# Patient Record
Sex: Male | Born: 1935 | Race: White | Hispanic: No | Marital: Married | Smoking: Former smoker
Health system: Southern US, Community
[De-identification: ages and names within clinical notes are randomized; demographics above are authoritative.]

## PROBLEM LIST (undated history)

## (undated) DIAGNOSIS — E785 Hyperlipidemia, unspecified: Secondary | ICD-10-CM

## (undated) DIAGNOSIS — K5730 Diverticulosis of large intestine without perforation or abscess without bleeding: Secondary | ICD-10-CM

## (undated) DIAGNOSIS — M4800 Spinal stenosis, site unspecified: Secondary | ICD-10-CM

## (undated) DIAGNOSIS — IMO0002 Reserved for concepts with insufficient information to code with codable children: Secondary | ICD-10-CM

## (undated) DIAGNOSIS — F32A Depression, unspecified: Secondary | ICD-10-CM

## (undated) DIAGNOSIS — D649 Anemia, unspecified: Secondary | ICD-10-CM

## (undated) DIAGNOSIS — K922 Gastrointestinal hemorrhage, unspecified: Secondary | ICD-10-CM

## (undated) DIAGNOSIS — N400 Enlarged prostate without lower urinary tract symptoms: Secondary | ICD-10-CM

## (undated) DIAGNOSIS — I2510 Atherosclerotic heart disease of native coronary artery without angina pectoris: Secondary | ICD-10-CM

## (undated) DIAGNOSIS — R011 Cardiac murmur, unspecified: Secondary | ICD-10-CM

## (undated) DIAGNOSIS — F329 Major depressive disorder, single episode, unspecified: Secondary | ICD-10-CM

## (undated) DIAGNOSIS — I272 Pulmonary hypertension, unspecified: Secondary | ICD-10-CM

## (undated) DIAGNOSIS — E119 Type 2 diabetes mellitus without complications: Secondary | ICD-10-CM

## (undated) DIAGNOSIS — I10 Essential (primary) hypertension: Secondary | ICD-10-CM

## (undated) DIAGNOSIS — E079 Disorder of thyroid, unspecified: Secondary | ICD-10-CM

## (undated) DIAGNOSIS — I509 Heart failure, unspecified: Secondary | ICD-10-CM

## (undated) DIAGNOSIS — M1990 Unspecified osteoarthritis, unspecified site: Secondary | ICD-10-CM

## (undated) DIAGNOSIS — I350 Nonrheumatic aortic (valve) stenosis: Secondary | ICD-10-CM

## (undated) HISTORY — DX: Pulmonary hypertension, unspecified: I27.20

## (undated) HISTORY — DX: Diverticulosis of large intestine without perforation or abscess without bleeding: K57.30

## (undated) HISTORY — DX: Anemia, unspecified: D64.9

## (undated) HISTORY — DX: Gastrointestinal hemorrhage, unspecified: K92.2

## (undated) HISTORY — DX: Depression, unspecified: F32.A

## (undated) HISTORY — DX: Reserved for concepts with insufficient information to code with codable children: IMO0002

## (undated) HISTORY — DX: Unspecified osteoarthritis, unspecified site: M19.90

## (undated) HISTORY — DX: Essential (primary) hypertension: I10

## (undated) HISTORY — DX: Benign prostatic hyperplasia without lower urinary tract symptoms: N40.0

## (undated) HISTORY — DX: Major depressive disorder, single episode, unspecified: F32.9

## (undated) HISTORY — DX: Atherosclerotic heart disease of native coronary artery without angina pectoris: I25.10

## (undated) HISTORY — DX: Hyperlipidemia, unspecified: E78.5

## (undated) HISTORY — DX: Type 2 diabetes mellitus without complications: E11.9

## (undated) HISTORY — DX: Heart failure, unspecified: I50.9

## (undated) HISTORY — DX: Nonrheumatic aortic (valve) stenosis: I35.0

## (undated) HISTORY — DX: Cardiac murmur, unspecified: R01.1

## (undated) HISTORY — PX: OTHER SURGICAL HISTORY: SHX169

## (undated) HISTORY — DX: Spinal stenosis, site unspecified: M48.00

## (undated) HISTORY — DX: Disorder of thyroid, unspecified: E07.9

---
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
## 1972-07-18 HISTORY — PX: HEMORRHOID SURGERY: SHX153

## 1990-07-18 HISTORY — PX: TOTAL SHOULDER REPLACEMENT: SUR1217

## 1991-07-19 HISTORY — PX: TRANSURETHRAL RESECTION OF PROSTATE: SHX73

## 1993-07-18 HISTORY — PX: CORONARY ARTERY BYPASS GRAFT: SHX141

## 1994-07-18 HISTORY — PX: NASAL STENOSIS REPAIR: SHX2071

## 1997-07-18 HISTORY — PX: CATARACT EXTRACTION: SUR2

## 1997-07-18 HISTORY — PX: CORONARY ARTERY BYPASS GRAFT: SHX141

## 1997-07-18 HISTORY — PX: AORTIC VALVE REPLACEMENT: SHX41

## 2008-03-18 ENCOUNTER — Encounter: Payer: Self-pay | Admitting: Internal Medicine

## 2008-05-16 ENCOUNTER — Ambulatory Visit: Payer: Self-pay | Admitting: Internal Medicine

## 2008-05-16 DIAGNOSIS — I10 Essential (primary) hypertension: Secondary | ICD-10-CM | POA: Insufficient documentation

## 2008-05-16 DIAGNOSIS — D509 Iron deficiency anemia, unspecified: Secondary | ICD-10-CM

## 2008-05-16 DIAGNOSIS — E785 Hyperlipidemia, unspecified: Secondary | ICD-10-CM

## 2008-05-16 DIAGNOSIS — N400 Enlarged prostate without lower urinary tract symptoms: Secondary | ICD-10-CM

## 2008-05-16 DIAGNOSIS — I2510 Atherosclerotic heart disease of native coronary artery without angina pectoris: Secondary | ICD-10-CM | POA: Insufficient documentation

## 2008-05-16 DIAGNOSIS — J309 Allergic rhinitis, unspecified: Secondary | ICD-10-CM | POA: Insufficient documentation

## 2008-05-16 DIAGNOSIS — M19019 Primary osteoarthritis, unspecified shoulder: Secondary | ICD-10-CM

## 2008-05-16 DIAGNOSIS — K5730 Diverticulosis of large intestine without perforation or abscess without bleeding: Secondary | ICD-10-CM | POA: Insufficient documentation

## 2008-05-16 DIAGNOSIS — E1149 Type 2 diabetes mellitus with other diabetic neurological complication: Secondary | ICD-10-CM | POA: Insufficient documentation

## 2008-05-18 DIAGNOSIS — IMO0002 Reserved for concepts with insufficient information to code with codable children: Secondary | ICD-10-CM

## 2008-05-18 HISTORY — DX: Reserved for concepts with insufficient information to code with codable children: IMO0002

## 2008-05-19 LAB — CONVERTED CEMR LAB
ALT: 22 units/L (ref 0–53)
BUN: 21 mg/dL (ref 6–23)
Bilirubin, Direct: 0.1 mg/dL (ref 0.0–0.3)
Chloride: 97 meq/L (ref 96–112)
Cholesterol: 149 mg/dL (ref 0–200)
Eosinophils Absolute: 0.1 10*3/uL (ref 0.0–0.7)
GFR calc Af Amer: 81 mL/min
Glucose, Bld: 113 mg/dL — ABNORMAL HIGH (ref 70–99)
HCT: 36 % — ABNORMAL LOW (ref 39.0–52.0)
HDL: 35.3 mg/dL — ABNORMAL LOW (ref >39.0)
Hemoglobin: 12.6 g/dL — ABNORMAL LOW (ref 13.0–17.0)
Hgb A1c MFr Bld: 6.9 % — ABNORMAL HIGH (ref 4.6–6.0)
MCHC: 34.9 g/dL (ref 30.0–36.0)
MCV: 97.6 fL (ref 78.0–100.0)
Monocytes Absolute: 0.6 10*3/uL (ref 0.1–1.0)
Neutro Abs: 3 10*3/uL (ref 1.4–7.7)
Phosphorus: 3.8 mg/dL (ref 2.3–4.6)
Platelets: 174 10*3/uL (ref 150–400)
Potassium: 4.4 meq/L (ref 3.5–5.1)
RDW: 12.2 % (ref 11.5–14.6)
Sodium: 136 meq/L (ref 135–145)
TSH: 3.95 microintl units/mL (ref 0.35–5.50)
Total Bilirubin: 0.7 mg/dL (ref 0.3–1.2)
Triglycerides: 91 mg/dL (ref 0–149)

## 2008-05-29 ENCOUNTER — Ambulatory Visit: Payer: Self-pay | Admitting: Family Medicine

## 2008-05-29 DIAGNOSIS — I4892 Unspecified atrial flutter: Secondary | ICD-10-CM

## 2008-05-30 ENCOUNTER — Encounter: Payer: Self-pay | Admitting: Internal Medicine

## 2008-05-30 ENCOUNTER — Ambulatory Visit: Payer: Self-pay | Admitting: Family Medicine

## 2008-06-02 ENCOUNTER — Ambulatory Visit: Payer: Self-pay | Admitting: Internal Medicine

## 2008-06-02 LAB — CONVERTED CEMR LAB: Prothrombin Time: 14.3 s

## 2008-06-06 ENCOUNTER — Ambulatory Visit: Payer: Self-pay | Admitting: Internal Medicine

## 2008-06-06 LAB — CONVERTED CEMR LAB: INR: 2.1

## 2008-06-13 ENCOUNTER — Ambulatory Visit: Payer: Self-pay | Admitting: Internal Medicine

## 2008-06-13 LAB — CONVERTED CEMR LAB: Prothrombin Time: 21.6 s

## 2008-06-18 ENCOUNTER — Encounter: Payer: Self-pay | Admitting: Internal Medicine

## 2008-06-18 ENCOUNTER — Telehealth: Payer: Self-pay | Admitting: Internal Medicine

## 2008-06-18 ENCOUNTER — Ambulatory Visit: Payer: Self-pay | Admitting: Internal Medicine

## 2008-06-18 ENCOUNTER — Ambulatory Visit: Payer: Self-pay

## 2008-06-18 DIAGNOSIS — I5041 Acute combined systolic (congestive) and diastolic (congestive) heart failure: Secondary | ICD-10-CM

## 2008-06-20 ENCOUNTER — Telehealth: Payer: Self-pay | Admitting: Internal Medicine

## 2008-06-25 ENCOUNTER — Ambulatory Visit: Payer: Self-pay | Admitting: Internal Medicine

## 2008-06-25 DIAGNOSIS — F329 Major depressive disorder, single episode, unspecified: Secondary | ICD-10-CM

## 2008-07-02 ENCOUNTER — Ambulatory Visit: Payer: Self-pay | Admitting: Internal Medicine

## 2008-07-02 LAB — CONVERTED CEMR LAB: Prothrombin Time: 15.6 s

## 2008-07-03 ENCOUNTER — Telehealth: Payer: Self-pay | Admitting: Internal Medicine

## 2008-07-14 ENCOUNTER — Ambulatory Visit: Payer: Self-pay | Admitting: Internal Medicine

## 2008-07-14 DIAGNOSIS — I4891 Unspecified atrial fibrillation: Secondary | ICD-10-CM

## 2008-07-15 LAB — CONVERTED CEMR LAB
Albumin: 3.3 g/dL — ABNORMAL LOW (ref 3.5–5.2)
BUN: 21 mg/dL (ref 6–23)
Basophils Relative: 0.1 % (ref 0.0–3.0)
CO2: 34 meq/L — ABNORMAL HIGH (ref 19–32)
Chloride: 96 meq/L (ref 96–112)
Creatinine, Ser: 1.2 mg/dL (ref 0.4–1.5)
Eosinophils Relative: 0.5 % (ref 0.0–5.0)
GFR calc Af Amer: 73 mL/min
Glucose, Bld: 261 mg/dL — ABNORMAL HIGH (ref 70–99)
Hemoglobin: 12.2 g/dL — ABNORMAL LOW (ref 13.0–17.0)
Lymphocytes Relative: 26.6 % (ref 12.0–46.0)
Monocytes Relative: 8.4 % (ref 3.0–12.0)
Neutro Abs: 3.5 10*3/uL (ref 1.4–7.7)
Neutrophils Relative %: 64.4 % (ref 43.0–77.0)
RBC: 3.64 M/uL — ABNORMAL LOW (ref 4.22–5.81)
WBC: 5.4 10*3/uL (ref 4.5–10.5)

## 2008-07-22 ENCOUNTER — Ambulatory Visit: Payer: Self-pay | Admitting: Internal Medicine

## 2008-07-22 LAB — CONVERTED CEMR LAB
INR: 1.8
Prothrombin Time: 16.4 s

## 2008-07-24 ENCOUNTER — Telehealth (INDEPENDENT_AMBULATORY_CARE_PROVIDER_SITE_OTHER): Payer: Self-pay | Admitting: *Deleted

## 2008-08-05 ENCOUNTER — Ambulatory Visit: Payer: Self-pay | Admitting: Internal Medicine

## 2008-08-05 LAB — CONVERTED CEMR LAB
INR: 2
Prothrombin Time: 17.5 s

## 2008-08-15 ENCOUNTER — Ambulatory Visit: Payer: Self-pay | Admitting: Internal Medicine

## 2008-08-26 ENCOUNTER — Telehealth: Payer: Self-pay | Admitting: Family Medicine

## 2008-08-28 ENCOUNTER — Ambulatory Visit: Payer: Self-pay | Admitting: Family Medicine

## 2008-09-03 ENCOUNTER — Ambulatory Visit: Payer: Self-pay | Admitting: Internal Medicine

## 2008-09-03 LAB — CONVERTED CEMR LAB: Prothrombin Time: 16.8 s

## 2008-09-11 ENCOUNTER — Ambulatory Visit: Payer: Self-pay | Admitting: Internal Medicine

## 2008-09-12 LAB — CONVERTED CEMR LAB
Albumin: 3.7 g/dL (ref 3.5–5.2)
BUN: 28 mg/dL — ABNORMAL HIGH (ref 6–23)
Chloride: 94 meq/L — ABNORMAL LOW (ref 96–112)
Creatinine, Ser: 1.3 mg/dL (ref 0.4–1.5)
Phosphorus: 3.7 mg/dL (ref 2.3–4.6)

## 2008-10-01 ENCOUNTER — Ambulatory Visit: Payer: Self-pay | Admitting: Internal Medicine

## 2008-10-01 LAB — CONVERTED CEMR LAB: INR: 2

## 2008-10-06 ENCOUNTER — Telehealth: Payer: Self-pay | Admitting: Internal Medicine

## 2008-10-09 ENCOUNTER — Telehealth: Payer: Self-pay | Admitting: Internal Medicine

## 2008-10-28 ENCOUNTER — Ambulatory Visit: Payer: Self-pay | Admitting: Internal Medicine

## 2008-10-28 LAB — CONVERTED CEMR LAB: Prothrombin Time: 17.8 s

## 2008-11-14 ENCOUNTER — Ambulatory Visit: Payer: Self-pay | Admitting: Internal Medicine

## 2008-11-17 LAB — CONVERTED CEMR LAB
Basophils Relative: 0 % (ref 0.0–3.0)
CO2: 32 meq/L (ref 19–32)
Calcium: 9.1 mg/dL (ref 8.4–10.5)
Creatinine, Ser: 1.2 mg/dL (ref 0.4–1.5)
Eosinophils Relative: 2.7 % (ref 0.0–5.0)
HCT: 37 % — ABNORMAL LOW (ref 39.0–52.0)
Hemoglobin: 12.8 g/dL — ABNORMAL LOW (ref 13.0–17.0)
Lymphs Abs: 1.8 10*3/uL (ref 0.7–4.0)
Monocytes Relative: 11.6 % (ref 3.0–12.0)
Platelets: 164 10*3/uL (ref 150.0–400.0)
RBC: 3.8 M/uL — ABNORMAL LOW (ref 4.22–5.81)
Sodium: 133 meq/L — ABNORMAL LOW (ref 135–145)
TSH: 5.8 microintl units/mL — ABNORMAL HIGH (ref 0.35–5.50)
WBC: 6 10*3/uL (ref 4.5–10.5)

## 2008-11-26 ENCOUNTER — Ambulatory Visit: Payer: Self-pay | Admitting: Internal Medicine

## 2008-11-26 LAB — CONVERTED CEMR LAB: INR: 2.9

## 2008-12-02 ENCOUNTER — Encounter: Payer: Self-pay | Admitting: Internal Medicine

## 2008-12-02 ENCOUNTER — Ambulatory Visit: Payer: Self-pay

## 2008-12-16 ENCOUNTER — Telehealth: Payer: Self-pay | Admitting: Internal Medicine

## 2008-12-22 ENCOUNTER — Ambulatory Visit: Payer: Self-pay | Admitting: Cardiovascular Disease

## 2008-12-22 ENCOUNTER — Encounter: Payer: Self-pay | Admitting: Cardiovascular Disease

## 2008-12-22 DIAGNOSIS — R609 Edema, unspecified: Secondary | ICD-10-CM

## 2008-12-24 ENCOUNTER — Ambulatory Visit: Payer: Self-pay | Admitting: Internal Medicine

## 2008-12-24 LAB — CONVERTED CEMR LAB: Prothrombin Time: 17.3 s

## 2009-01-08 ENCOUNTER — Telehealth: Payer: Self-pay | Admitting: Internal Medicine

## 2009-01-13 ENCOUNTER — Telehealth: Payer: Self-pay | Admitting: Family Medicine

## 2009-01-13 DIAGNOSIS — M545 Low back pain: Secondary | ICD-10-CM

## 2009-01-21 ENCOUNTER — Ambulatory Visit: Payer: Self-pay | Admitting: Internal Medicine

## 2009-01-21 LAB — CONVERTED CEMR LAB
INR: 2.5
Prothrombin Time: 19.3 s

## 2009-01-30 ENCOUNTER — Encounter: Payer: Self-pay | Admitting: Family Medicine

## 2009-01-30 ENCOUNTER — Telehealth: Payer: Self-pay | Admitting: Internal Medicine

## 2009-02-03 ENCOUNTER — Telehealth: Payer: Self-pay | Admitting: Internal Medicine

## 2009-02-17 ENCOUNTER — Ambulatory Visit: Payer: Self-pay | Admitting: Internal Medicine

## 2009-02-26 ENCOUNTER — Ambulatory Visit: Payer: Self-pay | Admitting: Cardiovascular Disease

## 2009-03-10 ENCOUNTER — Ambulatory Visit: Payer: Self-pay | Admitting: Internal Medicine

## 2009-04-06 ENCOUNTER — Encounter: Payer: Self-pay | Admitting: Internal Medicine

## 2009-04-07 ENCOUNTER — Ambulatory Visit: Payer: Self-pay | Admitting: Internal Medicine

## 2009-05-05 ENCOUNTER — Ambulatory Visit: Payer: Self-pay | Admitting: Internal Medicine

## 2009-05-05 LAB — CONVERTED CEMR LAB
INR: 3.1
Prothrombin Time: 21.4 s

## 2009-05-18 ENCOUNTER — Ambulatory Visit: Payer: Self-pay | Admitting: Internal Medicine

## 2009-05-21 LAB — CONVERTED CEMR LAB
ALT: 39 units/L (ref 0–53)
Alkaline Phosphatase: 158 units/L — ABNORMAL HIGH (ref 39–117)
Basophils Relative: 0.4 % (ref 0.0–3.0)
Bilirubin, Direct: 0.2 mg/dL (ref 0.0–0.3)
Eosinophils Absolute: 0 10*3/uL (ref 0.0–0.7)
Eosinophils Relative: 0.4 % (ref 0.0–5.0)
Glucose, Bld: 242 mg/dL — ABNORMAL HIGH (ref 70–99)
Lymphocytes Relative: 26.2 % (ref 12.0–46.0)
Neutrophils Relative %: 65.4 % (ref 43.0–77.0)
Phosphorus: 3.9 mg/dL (ref 2.3–4.6)
Potassium: 4.7 meq/L (ref 3.5–5.1)
RBC: 3.64 M/uL — ABNORMAL LOW (ref 4.22–5.81)
Sodium: 135 meq/L (ref 135–145)
Total Protein: 6.8 g/dL (ref 6.0–8.3)
WBC: 7 10*3/uL (ref 4.5–10.5)

## 2009-06-02 ENCOUNTER — Ambulatory Visit: Payer: Self-pay | Admitting: Internal Medicine

## 2009-06-02 LAB — CONVERTED CEMR LAB: INR: 3

## 2009-06-03 ENCOUNTER — Encounter: Payer: Self-pay | Admitting: Family Medicine

## 2009-06-04 ENCOUNTER — Emergency Department: Payer: PRIVATE HEALTH INSURANCE | Admitting: Unknown Physician Specialty

## 2009-06-05 ENCOUNTER — Telehealth: Payer: Self-pay | Admitting: Internal Medicine

## 2009-06-09 ENCOUNTER — Ambulatory Visit: Payer: Self-pay | Admitting: Internal Medicine

## 2009-06-16 ENCOUNTER — Encounter: Payer: Self-pay | Admitting: Family Medicine

## 2009-06-24 ENCOUNTER — Ambulatory Visit: Payer: Self-pay | Admitting: Family Medicine

## 2009-06-30 ENCOUNTER — Ambulatory Visit: Payer: Self-pay | Admitting: Internal Medicine

## 2009-06-30 LAB — CONVERTED CEMR LAB
INR: 2.8
Prothrombin Time: 20.4 s

## 2009-07-27 ENCOUNTER — Ambulatory Visit: Payer: Self-pay | Admitting: Cardiovascular Disease

## 2009-07-30 ENCOUNTER — Ambulatory Visit: Payer: Self-pay | Admitting: Internal Medicine

## 2009-07-30 LAB — CONVERTED CEMR LAB: Prothrombin Time: 22.1 s

## 2009-08-12 ENCOUNTER — Telehealth: Payer: Self-pay | Admitting: Internal Medicine

## 2009-08-12 ENCOUNTER — Ambulatory Visit: Payer: Self-pay | Admitting: Internal Medicine

## 2009-08-18 ENCOUNTER — Ambulatory Visit: Payer: Self-pay

## 2009-08-18 ENCOUNTER — Encounter: Payer: Self-pay | Admitting: Internal Medicine

## 2009-08-27 ENCOUNTER — Ambulatory Visit: Payer: Self-pay | Admitting: Internal Medicine

## 2009-08-27 LAB — CONVERTED CEMR LAB: INR: 2.3

## 2009-08-28 ENCOUNTER — Ambulatory Visit: Payer: Self-pay | Admitting: Internal Medicine

## 2009-09-03 ENCOUNTER — Telehealth: Payer: Self-pay | Admitting: Internal Medicine

## 2009-09-17 ENCOUNTER — Ambulatory Visit: Payer: Self-pay

## 2009-09-17 ENCOUNTER — Encounter: Payer: Self-pay | Admitting: Cardiovascular Disease

## 2009-09-24 ENCOUNTER — Ambulatory Visit: Payer: Self-pay | Admitting: Internal Medicine

## 2009-10-05 ENCOUNTER — Encounter: Payer: Self-pay | Admitting: Internal Medicine

## 2009-10-09 ENCOUNTER — Ambulatory Visit: Payer: Self-pay | Admitting: Internal Medicine

## 2009-10-22 ENCOUNTER — Telehealth: Payer: Self-pay | Admitting: Internal Medicine

## 2009-11-05 ENCOUNTER — Ambulatory Visit: Payer: Self-pay | Admitting: Internal Medicine

## 2009-11-05 LAB — CONVERTED CEMR LAB: Prothrombin Time: 30.1 s

## 2009-11-16 ENCOUNTER — Encounter: Payer: Self-pay | Admitting: Internal Medicine

## 2009-11-24 ENCOUNTER — Ambulatory Visit: Payer: Self-pay | Admitting: Internal Medicine

## 2009-11-25 LAB — CONVERTED CEMR LAB
ALT: 28 units/L (ref 0–53)
Albumin: 3.8 g/dL (ref 3.5–5.2)
Basophils Relative: 0.5 % (ref 0.0–3.0)
GFR calc non Af Amer: 64.17 mL/min (ref >60)
Glucose, Bld: 262 mg/dL — ABNORMAL HIGH (ref 70–99)
HDL: 37 mg/dL — ABNORMAL LOW (ref >39.00)
Hemoglobin: 12.4 g/dL — ABNORMAL LOW (ref 13.0–17.0)
Hgb A1c MFr Bld: 8.4 % — ABNORMAL HIGH (ref 4.6–6.5)
Lymphocytes Relative: 28.3 % (ref 12.0–46.0)
Monocytes Relative: 10.9 % (ref 3.0–12.0)
Neutro Abs: 3.4 10*3/uL (ref 1.4–7.7)
Neutrophils Relative %: 59.5 % (ref 43.0–77.0)
Phosphorus: 3.6 mg/dL (ref 2.3–4.6)
Potassium: 4.5 meq/L (ref 3.5–5.1)
RBC: 3.63 M/uL — ABNORMAL LOW (ref 4.22–5.81)
Sodium: 135 meq/L (ref 135–145)
Total Bilirubin: 0.9 mg/dL (ref 0.3–1.2)
Total Protein: 6.9 g/dL (ref 6.0–8.3)
Triglycerides: 67 mg/dL (ref 0.0–149.0)
VLDL: 13.4 mg/dL (ref 0.0–40.0)
WBC: 5.7 10*3/uL (ref 4.5–10.5)

## 2009-12-09 ENCOUNTER — Ambulatory Visit: Payer: PRIVATE HEALTH INSURANCE

## 2009-12-22 ENCOUNTER — Ambulatory Visit: Payer: Self-pay | Admitting: Internal Medicine

## 2009-12-22 DIAGNOSIS — E039 Hypothyroidism, unspecified: Secondary | ICD-10-CM | POA: Insufficient documentation

## 2009-12-22 LAB — CONVERTED CEMR LAB
INR: 2.1
Prothrombin Time: 24.8 s

## 2009-12-23 LAB — CONVERTED CEMR LAB: Free T4: 0.9 ng/dL (ref 0.6–1.6)

## 2010-01-15 ENCOUNTER — Ambulatory Visit: Payer: Self-pay | Admitting: Internal Medicine

## 2010-01-15 LAB — CONVERTED CEMR LAB: Prothrombin Time: 30.5 s

## 2010-01-26 ENCOUNTER — Ambulatory Visit: Payer: Self-pay | Admitting: Cardiovascular Disease

## 2010-01-26 ENCOUNTER — Telehealth: Payer: Self-pay | Admitting: Cardiovascular Disease

## 2010-02-11 ENCOUNTER — Telehealth: Payer: Self-pay | Admitting: Internal Medicine

## 2010-02-11 ENCOUNTER — Telehealth: Payer: Self-pay | Admitting: Cardiovascular Disease

## 2010-02-12 ENCOUNTER — Ambulatory Visit: Payer: Self-pay | Admitting: Internal Medicine

## 2010-03-12 ENCOUNTER — Ambulatory Visit: Payer: Self-pay | Admitting: Internal Medicine

## 2010-03-12 LAB — CONVERTED CEMR LAB: Prothrombin Time: 17.8 s

## 2010-03-26 ENCOUNTER — Ambulatory Visit: Payer: Self-pay | Admitting: Internal Medicine

## 2010-03-26 LAB — CONVERTED CEMR LAB
INR: 1.7
Prothrombin Time: 19.8 s

## 2010-04-02 ENCOUNTER — Ambulatory Visit: Payer: Self-pay | Admitting: Internal Medicine

## 2010-04-02 LAB — CONVERTED CEMR LAB: INR: 1.8

## 2010-04-12 ENCOUNTER — Telehealth: Payer: Self-pay | Admitting: Internal Medicine

## 2010-04-16 ENCOUNTER — Ambulatory Visit: Payer: Self-pay | Admitting: Internal Medicine

## 2010-04-16 LAB — CONVERTED CEMR LAB
INR: 2
Prothrombin Time: 24.4 s

## 2010-05-14 ENCOUNTER — Ambulatory Visit: Payer: Self-pay | Admitting: Internal Medicine

## 2010-05-17 ENCOUNTER — Ambulatory Visit: Payer: Self-pay | Admitting: Internal Medicine

## 2010-05-17 DIAGNOSIS — L98499 Non-pressure chronic ulcer of skin of other sites with unspecified severity: Secondary | ICD-10-CM | POA: Insufficient documentation

## 2010-05-17 DIAGNOSIS — L570 Actinic keratosis: Secondary | ICD-10-CM | POA: Insufficient documentation

## 2010-05-21 ENCOUNTER — Telehealth: Payer: Self-pay | Admitting: Internal Medicine

## 2010-05-26 ENCOUNTER — Ambulatory Visit: Payer: Self-pay | Admitting: Internal Medicine

## 2010-05-26 LAB — HM DIABETES FOOT EXAM

## 2010-05-28 LAB — CONVERTED CEMR LAB
ALT: 50 units/L (ref 0–53)
Albumin: 3.4 g/dL — ABNORMAL LOW (ref 3.5–5.2)
Basophils Relative: 0.6 % (ref 0.0–3.0)
CO2: 33 meq/L — ABNORMAL HIGH (ref 19–32)
Calcium: 9.2 mg/dL (ref 8.4–10.5)
Eosinophils Absolute: 0.1 10*3/uL (ref 0.0–0.7)
Eosinophils Relative: 2 % (ref 0.0–5.0)
Free T4: 1.01 ng/dL (ref 0.60–1.60)
Glucose, Bld: 234 mg/dL — ABNORMAL HIGH (ref 70–99)
HCT: 37.2 % — ABNORMAL LOW (ref 39.0–52.0)
Hgb A1c MFr Bld: 8.6 % — ABNORMAL HIGH (ref 4.6–6.5)
Lymphs Abs: 1.6 10*3/uL (ref 0.7–4.0)
MCHC: 34.1 g/dL (ref 30.0–36.0)
MCV: 100.3 fL — ABNORMAL HIGH (ref 78.0–100.0)
Monocytes Absolute: 0.6 10*3/uL (ref 0.1–1.0)
Neutro Abs: 2.6 10*3/uL (ref 1.4–7.7)
Neutrophils Relative %: 53.5 % (ref 43.0–77.0)
Potassium: 4.3 meq/L (ref 3.5–5.1)
RBC: 3.71 M/uL — ABNORMAL LOW (ref 4.22–5.81)
Sodium: 133 meq/L — ABNORMAL LOW (ref 135–145)
Total Protein: 6.5 g/dL (ref 6.0–8.3)
WBC: 4.8 10*3/uL (ref 4.5–10.5)

## 2010-06-14 ENCOUNTER — Ambulatory Visit: Payer: Self-pay | Admitting: Internal Medicine

## 2010-06-14 LAB — CONVERTED CEMR LAB
INR: 1.7
Prothrombin Time: 20.5 s

## 2010-06-28 ENCOUNTER — Ambulatory Visit: Payer: Self-pay | Admitting: Internal Medicine

## 2010-06-28 LAB — CONVERTED CEMR LAB: Prothrombin Time: 24.2 s

## 2010-07-06 ENCOUNTER — Encounter: Payer: Self-pay | Admitting: Cardiovascular Disease

## 2010-07-06 ENCOUNTER — Ambulatory Visit: Payer: Self-pay | Admitting: Cardiovascular Disease

## 2010-07-06 DIAGNOSIS — I25810 Atherosclerosis of coronary artery bypass graft(s) without angina pectoris: Secondary | ICD-10-CM | POA: Insufficient documentation

## 2010-07-26 ENCOUNTER — Ambulatory Visit
Admission: RE | Admit: 2010-07-26 | Discharge: 2010-07-26 | Payer: Self-pay | Source: Home / Self Care | Attending: Internal Medicine | Admitting: Internal Medicine

## 2010-07-26 LAB — CONVERTED CEMR LAB: INR: 2

## 2010-08-17 NOTE — Assessment & Plan Note (Signed)
Summary: WOUND ON ARM/ds   Vital Signs:  Patient profile:   77 year old male Weight:      148 pounds Temp:     97.9 degrees F oral BP sitting:   128 / 70  (left arm) Cuff size:   large  Vitals Entered By: Mervin Hack CMA Duncan Dull) (May 17, 2010 4:48 PM) CC: wound on right arm   History of Present Illness: Has had bumps on left arm going on for 3 weeks red and white at times Must have sloughed them off and now some pus and looks inflamed  tried treating it at home then seen by Angelica Chessman RN at [facility] who treated it with antibiotic topically and bandage  still not healing  No fever  Allergies: 1)  Lisinopril (Lisinopril)  Past History:  Past medical, surgical, family and social histories (including risk factors) reviewed for relevance to current acute and chronic problems.  Past Medical History: Reviewed history from 06/09/2009 and no changes required. Allergic rhinitis Anemia-NOS Coronary artery disease s/p CABG 1995 with redo 1999 and AVR 1999 Diabetes mellitus, type II Hyperlipidemia Hypertension Osteoarthritis Benign prostatic hypertrophy Diverticulosis, colon--with repeated bleeds Atrial fibrillation/flutter, 11/09 Depression Congestive heart failure  Past Surgical History: Reviewed history from 12/22/2008 and no changes required. Tonsilectomy/Adenoidectomy---1933 Coronary artery bypass graft--1995, redo 1999 Hemorrhoidectomy--1974 Transurethral resection of prostate--1993 Nasal repair--1996 Aortic valve replacement--pig valve---1999 Right shoulder replacement--1992 Left retinal vitrectomy --2006 Cataracts--1999 Rectal bleeding --numerous times from divertuculosis  Family History: Reviewed history from 05/16/2008 and no changes required. Dad died of sucide @49  Mom died @52  of cancer (male) Only child Not clear about his distant history but there were some long lived grandparents Mat GF died of Parkinson's  Social History: Reviewed  history from 05/16/2008 and no changes required. 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 1 son retarded--lives in group home None in the area Former Smoker--quit 1952 Alcohol use-yes  Has living will. Wife has health care POA and then son, Weston Brass No prior discussions about DNR but he very much wants it. Does not want a feeding tube  Review of Systems       no nausea or dairrhea No trouble with appetite no other skin lesions Losing sight in his right eye---has eval by retina specialist coming up  Physical Exam  Skin:  15mm ulcer with no eschar on left proximal forearm Has adjacent scaly raised area abutting it no surrounding inflammation or redness   Impression & Recommendations:  Problem # 1:  CHRONIC ULCER OF UNSPECIFIED SITE (ICD-707.9) Assessment New has been there for several weeks now Not healing despite proper topical care will treat it as possible early malignancy  will recheck this at his upcoming appt  Problem # 2:  ACTINIC KERATOSIS (ICD-702.0) Assessment: Comment Only  liquid nitrogen to this lesion and the adjacent ulcer for 50 seconds x 2 discussed home care  Orders: Cryotherapy/Destruction benign or premalignant lesion (1st lesion)  (17000)  Complete Medication List: 1)  Metoprolol Tartrate 25 Mg Tabs (Metoprolol tartrate) .... Take 1/2  tablet by mouth twice a day 2)  Glipizide 10 Mg Tabs (Glipizide) .Marland Kitchen.. 1 tablet two times a day by mouth 3)  Aspirin Adult Low Strength 81 Mg Tbec (Aspirin) .... Take 1 tablet by mouth every other day 4)  Multivitamins Tabs (Multiple vitamin) .Marland Kitchen.. 1 past breakfast by mouth 5)  Ranitidine Hcl 75 Mg Tabs (Ranitidine hcl) .... 2 tablets at bedtime 6)  Warfarin Sodium 2.5 Mg Tabs (Warfarin sodium) .Marland Kitchen.. 1 tablet by mouth  daily 7)  Furosemide 40 Mg Tabs (Furosemide) .... Take 1-2 by mouth once daily as needed 8)  Isosorbide Mononitrate Cr 30 Mg Xr24h-tab (Isosorbide mononitrate) .... Take 1 by  mouth once daily 9)  Cozaar 50 Mg Tabs (Losartan potassium) .... Take 1 by mouth once daily 10)  Ipratropium Bromide 0.03 % Soln (Ipratropium bromide) .... As needed 11)  Simethicone 80 Mg Chew (Simethicone) .... As needed 12)  Hydrocodone-acetaminophen 5-325 Mg Tabs (Hydrocodone-acetaminophen) .Marland Kitchen.. 1-2 tabs at bedtime as needed for severe leg pain 13)  Ferrex 150 Forte Plus 50-100 Mg Caps (Fe-succ ac-c-thre ac-b12-fa) .Marland Kitchen.. 1 tablet at bedtime by mouth 14)  Metamucil 30.9 % Powd (Psyllium) .Marland Kitchen.. 1 teaspoon at bedtime 15)  Mylanta 200-200-20 Mg/47ml Susp (Alum & mag hydroxide-simeth) .... As needed as needed 16)  Levothyroxine Sodium 25 Mcg Tabs (Levothyroxine sodium) .... Take one by mouth daily 17)  Bayer Breeze 2 Test Disk (Glucose blood) .... Test up to 4 times per day.  Patient Instructions: 1)  Please keep appt on November 9th   Orders Added: 1)  Est. Patient Level III [99213] 2)  Cryotherapy/Destruction benign or premalignant lesion (1st lesion)  [17000]    Current Allergies (reviewed today): LISINOPRIL (LISINOPRIL)

## 2010-08-17 NOTE — Progress Notes (Signed)
Summary: Geographical information systems officer Call   Call placed by: Benedict Needy, RN,  January 26, 2010 11:41 AM Call placed to: Patient Summary of Call: Attempted to call pt about [employer].  2 classes per week/month $27.48 and 3 classes per week/month $45.00. LMOM TCB Initial call taken by: Benedict Needy, RN,  January 26, 2010 11:42 AM  Follow-up for Phone Call        pt called and gave him the info about [facility] [employer] and the phone number to contact them.  Follow-up by: Benedict Needy, RN,  January 26, 2010 1:49 PM

## 2010-08-17 NOTE — Progress Notes (Signed)
Summary: refill request for metoprolol  Phone Note Refill Request Message from:  Fax from Pharmacy  Refills Requested: Medication #1:  METOPROLOL TARTRATE 25 MG TABS Take 1/2  tablet by mouth once a day   Last Refilled: 03/20/2010 faxed request from [facility] [address] is on your desk. Pt says he takes one half twice a day, chart says one half once a day.  Initial call taken by: Lowella Petties CMA,  April 12, 2010 4:01 PM  Follow-up for Phone Call        chart updated to what he is taking and new Rx sent Follow-up by: Cindee Salt MD,  April 12, 2010 4:59 PM    New/Updated Medications: METOPROLOL TARTRATE 25 MG TABS (METOPROLOL TARTRATE) Take 1/2  tablet by mouth twice a day Prescriptions: METOPROLOL TARTRATE 25 MG TABS (METOPROLOL TARTRATE) Take 1/2  tablet by mouth twice a day  #30 x 11   Entered and Authorized by:   Cindee Salt MD   Signed by:   Cindee Salt MD on 04/12/2010   Method used:   Electronically to        [id number]       [address]       [place], [place]  [address]       Ph: [phone]       Fax: [phone]   RxID:   [id number]

## 2010-08-17 NOTE — Assessment & Plan Note (Signed)
Summary: 2:15 LEGS SWOLLEN/DS   Vital Signs:  Patient profile:   77 year old male Weight:      149 pounds Temp:     97.7 degrees F oral BP sitting:   138 / 70  (left arm) Cuff size:   regular  Vitals Entered By: Mervin Hack CMA Duncan Dull) (August 12, 2009 11:34 AM) CC: pain in ankles   History of Present Illness: Having "terrific" pain in legs Esp in AM started in left leg and now in right as well  Gets "spasms" and it is severe pain Trouble sleeping when this occurs tried 2 extra strength tylenol and quinine Sat with legs up in recliner and it helped  Pain concentrated around medial malleolus Not as noticeable with walking (though he has trouble walking)  Allergies: 1)  Lisinopril (Lisinopril)  Past History:  Past medical, surgical, family and social histories (including risk factors) reviewed for relevance to current acute and chronic problems.  Past Medical History: Reviewed history from 06/09/2009 and no changes required. Allergic rhinitis Anemia-NOS Coronary artery disease s/p CABG 1995 with redo 1999 and AVR 1999 Diabetes mellitus, type II Hyperlipidemia Hypertension Osteoarthritis Benign prostatic hypertrophy Diverticulosis, colon--with repeated bleeds Atrial fibrillation/flutter, 11/09 Depression Congestive heart failure  Past Surgical History: Reviewed history from 12/22/2008 and no changes required. Tonsilectomy/Adenoidectomy---1933 Coronary artery bypass graft--1995, redo 1999 Hemorrhoidectomy--1974 Transurethral resection of prostate--1993 Nasal repair--1996 Aortic valve replacement--pig valve---1999 Right shoulder replacement--1992 Left retinal vitrectomy --2006 Cataracts--1999 Rectal bleeding --numerous times from divertuculosis  Family History: Reviewed history from 05/16/2008 and no changes required. Dad died of sucide @49  Mom died @52  of cancer (male) Only child Not clear about his distant history but there were some long lived  grandparents Mat GF died of Parkinson's  Social History: Reviewed history from 05/16/2008 and no changes required. 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 1 son retarded--lives in group home None in the area Former Smoker--quit 1952 Alcohol use-yes  Has living will. Wife has health care POA and then son, Weston Brass No prior discussions about DNR but he very much wants it. Does not want a feeding tube  Review of Systems       Lots of little things bugging him--shoulder pain stomach trouble--diverticulitis in past--but manages diet carefully (no peanuts, etc) No chest pain  No SOB  Physical Exam  General:  alert.  NAD Neck:  supple and no masses.   Lungs:  normal respiratory effort and normal breath sounds.   Heart:  normal rate, no gallop, and irregular rhythm.   Gr 3/6 sys murmur at base Pulses:  not palpable Extremities:  trace edema without pitting Psych:  normally interactive and good eye contact.     Impression & Recommendations:  Problem # 1:  LEG PAIN, BILATERAL (ICD-729.5) Assessment New  has had various pains but this seems different not classic for ischemic pain but still possible will recheck ABIs norco for pain relief as needed   Orders: LE Arterial Doppler/ABI (Le arterial doppler)  Problem # 2:  ATRIAL FIBRILLATION (ICD-427.31) Assessment: Unchanged rate controlled  no CHF  His updated medication list for this problem includes:    Metoprolol Tartrate 25 Mg Tabs (Metoprolol tartrate) .Marland Kitchen... 1 by mouth once daily    Aspirin Adult Low Strength 81 Mg Tbec (Aspirin) .Marland Kitchen... Take 1 tablet by mouth every other day    Warfarin Sodium 2.5 Mg Tabs (Warfarin sodium) .Marland Kitchen... 1 tablet by mouth daily  Problem # 3:     Complete Medication List: 1)  Metoprolol Tartrate  25 Mg Tabs (Metoprolol tartrate) .Marland Kitchen.. 1 by mouth once daily 2)  Glipizide 10 Mg Tabs (Glipizide) .Marland Kitchen.. 1 tablet two times a day by mouth 3)  Ferrex 150 Forte Plus 50-100  Mg Caps (Fe-succ ac-c-thre ac-b12-fa) .Marland Kitchen.. 1 tablet at bedtime by mouth 4)  Aspirin Adult Low Strength 81 Mg Tbec (Aspirin) .... Take 1 tablet by mouth every other day 5)  Fish Oil Concentrate 1000 Mg Caps (Omega-3 fatty acids) .... By mouth past breakfast 6)  Multivitamins Tabs (Multiple vitamin) .Marland Kitchen.. 1 past breakfast by mouth 7)  Metamucil 30.9 % Powd (Psyllium) .Marland Kitchen.. 1 teaspoon at bedtime 8)  Ranitidine Hcl 75 Mg Tabs (Ranitidine hcl) .... 2 tablets at bedtime 9)  Mylanta 200-200-20 Mg/32ml Susp (Alum & mag hydroxide-simeth) .... As needed as needed 10)  Warfarin Sodium 2.5 Mg Tabs (Warfarin sodium) .Marland Kitchen.. 1 tablet by mouth daily 11)  Furosemide 40 Mg Tabs (Furosemide) .... Take 1-2 by mouth once daily as needed 12)  Isosorbide Mononitrate Cr 30 Mg Xr24h-tab (Isosorbide mononitrate) .... Take 1 by mouth once daily 13)  Ipratropium Bromide 0.03 % Soln (Ipratropium bromide) .... As needed 14)  Simethicone 80 Mg Chew (Simethicone) .... As needed 15)  Cozaar 50 Mg Tabs (Losartan potassium) .... Take 1 by mouth once daily 16)  Hydrocodone-acetaminophen 5-325 Mg Tabs (Hydrocodone-acetaminophen) .Marland Kitchen.. 1-2 tabs at bedtime as needed for severe leg pain  Patient Instructions: 1)  Please keep regular follow up 2)  Please set up leg circulation test Prescriptions: HYDROCODONE-ACETAMINOPHEN 5-325 MG TABS (HYDROCODONE-ACETAMINOPHEN) 1-2 tabs at bedtime as needed for severe leg pain  #60 x 0   Entered and Authorized by:   Cindee Salt MD   Signed by:   Cindee Salt MD on 08/12/2009   Method used:   Print then Give to Patient   RxID:   [id number]   Current Allergies (reviewed today): LISINOPRIL (LISINOPRIL)

## 2010-08-17 NOTE — Progress Notes (Signed)
Summary: Both ankles and calves hurting with redness in calves  Phone Note Call from Patient Call back at [phone]   Caller: Patient Call For: Cindee Salt MD Reason for Call: Lab or Test Results Summary of Call: Pt said having terrific pain in both ankles and goes up calves especially at night for 2 days. Pt said both legs have redness in calf area. No swelling in legs. No known injury. Pt has been taking Tylenol Extra Strength and that does help the pain. Last night had to sit in the chair all night due to ankles hurting. [phone] if needed. Please advise.  Initial call taken by: Lewanda Rife LPN,  August 12, 2009 8:53 AM  Follow-up for Phone Call        recent circulation test was okay but pain is concerning see if he can come in at 2:15PM today Follow-up by: Cindee Salt MD,  August 12, 2009 9:29 AM  Additional Follow-up for Phone Call Additional follow up Details #1::        appt today at 2:15 Additional Follow-up by: Mervin Hack CMA Duncan Dull),  August 12, 2009 9:33 AM

## 2010-08-17 NOTE — Progress Notes (Signed)
Summary: sore on arm   Phone Note Call from Patient Call back at Home Phone [phone]   Caller: Patient Call For: Cindee Salt MD Summary of Call: Patient was in on monday for a sore on his arm. He says that it is stil oozing pinkish pus alot. Patient says that it isn't causing him any pain, but is concerned about the draining. He is asking if he should follow up again or if it is normal to have the oozing.  Initial call taken by: Melody Comas,  May 21, 2010 11:10 AM  Follow-up for Phone Call        this is expected after the treatment I did this may take 2-3 weeks to heal completely If not back close to normal within about 3 weeks, I will need to check it again Follow-up by: Cindee Salt MD,  May 21, 2010 11:59 AM  Additional Follow-up for Phone Call Additional follow up Details #1::        Spoke with patient and advised results.  Additional Follow-up by: Mervin Hack CMA Duncan Dull),  May 21, 2010 12:53 PM

## 2010-08-17 NOTE — Progress Notes (Signed)
Summary: wants to reduce coumadin and diuretic doses  Phone Note Call from Patient Call back at Home Phone [phone]   Caller: Patient Call For: Cindee Salt MD Summary of Call: Pt has been on coumadin for several years and he is concerned about that.  He says every time he bumps his arm he has a lot of bleeding and has slow healing.  He is asking if he can cut that back.  Also, he is up with frequent urination at night so he is asking if his diuretic dose can be reduced as well. Initial call taken by: Lowella Petties CMA,  February 11, 2010 2:39 PM  Follow-up for Phone Call        Please call him back I think he should stay on the coumadin but we can try to reduce the dose a little bit. Have him take 1.25mg  3 days per week and 2.5 mg the other days. We will recheck his protime at the scheduled time  I think he needs to continue the diuretic because he could develop breathing problems without it we can discuss this further at his next visit Follow-up by: Cindee Salt MD,  February 11, 2010 3:20 PM  Additional Follow-up for Phone Call Additional follow up Details #1::        Patient was made aware and will keep his appointments as scheduled. He wrote instructions down to make sure he understood Additional Follow-up by: Janee Morn CMA,  February 11, 2010 4:21 PM

## 2010-08-17 NOTE — Progress Notes (Signed)
Summary: Pt feels better.  Phone Note Call from Patient Call back at Home Phone [phone]   Caller: Patient Call For: Cindee Salt MD Summary of Call: patient calling to advise that he feels much better and that he will continue the meds. Initial call taken by: Mervin Hack CMA Duncan Dull),  September 03, 2009 9:49 AM  Follow-up for Phone Call        good to hear Follow-up by: Cindee Salt MD,  September 03, 2009 10:07 AM

## 2010-08-17 NOTE — Assessment & Plan Note (Signed)
Summary: F8M/AMD   Visit Type:  Follow-up Primary Provider:  Cindee Salt MD  CC:  severe fall in Nov 2010- lac above R eye (fell asleep at dinner table and fell out of chair), unsteady gait, easily fatigued, dozes off easily, feels like HR races when he gets up at night, SOB frequently, and dizzy frequently.  History of Present Illness: 77 yo WM with history of DM, HTN, hyperlipidemia, CAD s/p CABG and redo, s/p porcine AVR and  diagnosis of atrial fibrillation in November 2009 on coumadin therapy who comes in today for cardiac follow up. His lower extremity swelling has been better on increased dose of Lasix.  He denies any chest pain. He has had some palpitations at night when he gets up to go to the bathroom. He has not passed out but he did fall asleep in a chair in November and hit his head sustaining a laceration.  Echo in December of 2009 showed normal LV function with normally functioning AVR and moderate MR. His overall energy level has not been as good since he has been in atrial fibrillation.  He has had some pain in his legs with ambulation but arterial dopplers in may 2010 showed no obstructive lower extremity arterial disease.   He remains in atrial fibrillation today.  His INR has been in a therapeutic range. He is doing well overall. He does report lack of sleep and this seems to be because of right shoulder pain which is chronic following a shoulder replacement surgery. He also has severe chronic low back pain.   Current Medications (verified): 1)  Metoprolol Tartrate 25 Mg Tabs (Metoprolol Tartrate) .Marland Kitchen.. 1 By Mouth Once Daily 2)  Glipizide 10 Mg Tabs (Glipizide) .Marland Kitchen.. 1 Tablet Two Times A Day By Mouth 3)  Ferrex 150 Forte Plus 50-100 Mg Caps (Fe-Succ Ac-C-Thre Ac-B12-Fa) .Marland Kitchen.. 1 Tablet At Bedtime By Mouth 4)  Aspirin Adult Low Strength 81 Mg Tbec (Aspirin) .... Take 1 Tablet By Mouth Every Other Day 5)  Fish Oil Concentrate 1000 Mg Caps (Omega-3 Fatty Acids) .... By Mouth  Past Breakfast 6)  Multivitamins  Tabs (Multiple Vitamin) .Marland Kitchen.. 1 Past Breakfast By Mouth 7)  Metamucil 30.9 % Powd (Psyllium) .Marland Kitchen.. 1 Teaspoon At Bedtime 8)  Ranitidine Hcl 75 Mg Tabs (Ranitidine Hcl) .... 2 Tablets At Bedtime 9)  Mylanta 200-200-20 Mg/28ml Susp (Alum & Mag Hydroxide-Simeth) .... As Needed As Needed 10)  Warfarin Sodium 2.5 Mg  Tabs (Warfarin Sodium) .Marland Kitchen.. 1 Tablet By Mouth Daily 11)  Furosemide 40 Mg Tabs (Furosemide) .... Take 1-2 By Mouth Once Daily As Needed 12)  Isosorbide Mononitrate Cr 30 Mg Xr24h-Tab (Isosorbide Mononitrate) .... Take 1 By Mouth Once Daily 13)  Ipratropium Bromide 0.03 % Soln (Ipratropium Bromide) .... As Needed 14)  Simethicone 80 Mg Chew (Simethicone) .... As Needed 15)  Cozaar 50 Mg Tabs (Losartan Potassium) .... Take 1 By Mouth Once Daily  Allergies (verified): 1)  Lisinopril (Lisinopril)  Past History:  Past Medical History: Reviewed history from 06/09/2009 and no changes required. Allergic rhinitis Anemia-NOS Coronary artery disease s/p CABG 1995 with redo 1999 and AVR 1999 Diabetes mellitus, type II Hyperlipidemia Hypertension Osteoarthritis Benign prostatic hypertrophy Diverticulosis, colon--with repeated bleeds Atrial fibrillation/flutter, 11/09 Depression Congestive heart failure  Social History: Reviewed history from 05/16/2008 and no changes required. 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 1 son retarded--lives in group home None in the area Former Smoker--quit 1952 Alcohol use-yes  Has living will. Wife has health care POA and then son,  Weston Brass No prior discussions about DNR but he very much wants it. Does not want a feeding tube  Review of Systems       The patient complains of fatigue, malaise, palpitations, shortness of breath, and joint pain.  The patient denies fever, weight gain/loss, vision loss, decreased hearing, hoarseness, chest pain, prolonged cough, wheezing, sleep apnea,  coughing up blood, abdominal pain, blood in stool, nausea, vomiting, diarrhea, heartburn, incontinence, blood in urine, muscle weakness, leg swelling, rash, skin lesions, headache, fainting, dizziness, depression, anxiety, enlarged lymph nodes, easy bruising or bleeding, and environmental allergies.    Vital Signs:  Patient profile:   77 year old male Height:      68 inches Weight:      153.75 pounds Pulse rate:   70 / minute Pulse rhythm:   irregularly irregular BP sitting:   110 / 52  (right arm) Cuff size:   regular  Vitals Entered By: Charlena Cross, RN, BSN (July 27, 2009 3:46 PM)  Physical Exam  General:  General: Well developed, well nourished, NAD HEENT: OP clear, mucus membranes moist SKIN: warm, dry Psychiatric: Mood and affect normal Neck: No JVD, no carotid bruits, no thyromegaly, no lymphadenopathy. Lungs:Clear bilaterally, no wheezes, rhonci, crackles [id number], No murmurs, gallops rubs Abdomen: soft, NT, ND, BS present Extremities: Trace bilateral lower ext  edema, pulses 2+.    Impression & Recommendations:  Problem # 1:  ATRIAL FIBRILLATION (ICD-427.31) Remains in persistent atrial fibrillation. Rate controlled on Metoprolol. He is on chronic coumadin and tells me that his INR levels have been therapeutic. No changes at this time.  His updated medication list for this problem includes:    Metoprolol Tartrate 25 Mg Tabs (Metoprolol tartrate) .Marland Kitchen... 1 by mouth once daily    Aspirin Adult Low Strength 81 Mg Tbec (Aspirin) .Marland Kitchen... Take 1 tablet by mouth every other day    Warfarin Sodium 2.5 Mg Tabs (Warfarin sodium) .Marland Kitchen... 1 tablet by mouth daily  Problem # 2:  CORONARY ARTERY DISEASE (ICD-414.00) Stable. No chest pain. Continue current medications. Repeat echo in 6 months.  His updated medication list for this problem includes:    Metoprolol Tartrate 25 Mg Tabs (Metoprolol tartrate) .Marland Kitchen... 1 by mouth once daily    Aspirin Adult Low Strength 81 Mg Tbec  (Aspirin) .Marland Kitchen... Take 1 tablet by mouth every other day    Warfarin Sodium 2.5 Mg Tabs (Warfarin sodium) .Marland Kitchen... 1 tablet by mouth daily    Isosorbide Mononitrate Cr 30 Mg Xr24h-tab (Isosorbide mononitrate) .Marland Kitchen... Take 1 by mouth once daily  Orders: Echocardiogram (Echo)  Problem # 3:  HYPERTENSION (ICD-401.9) Well controlled on current therapy.   His updated medication list for this problem includes:    Metoprolol Tartrate 25 Mg Tabs (Metoprolol tartrate) .Marland Kitchen... 1 by mouth once daily    Aspirin Adult Low Strength 81 Mg Tbec (Aspirin) .Marland Kitchen... Take 1 tablet by mouth every other day    Furosemide 40 Mg Tabs (Furosemide) .Marland Kitchen... Take 1-2 by mouth once daily as needed    Cozaar 50 Mg Tabs (Losartan potassium) .Marland Kitchen... Take 1 by mouth once daily  Problem # 4:  BACK PAIN (ICD-724.5) It seems that his back pain and shoulder pain are his biggest complaints at this time. He has not seen an orthopedic surgeon recently. I have asked him to speak to Dr. Alphonsus Sias about this and possibly get a referral over for a second opinion in regards to his back and shoulder.   Patient Instructions: 1)  Your physician  recommends that you schedule a follow-up appointment in: 6 months 2)  Your physician has requested that you have an echocardiogram.  Echocardiography is a painless test that uses sound waves to create images of your heart. It provides your doctor with information about the size and shape of your heart and how well your heart's chambers and valves are working.  This procedure takes approximately one hour. There are no restrictions for this procedure.

## 2010-08-17 NOTE — Consult Note (Signed)
Summary: [facility]   Imported By: Lanelle Bal 10/17/2009 08:37:08  _____________________________________________________________________  External Attachment:    Type:   Image     Comment:   External Document  Appended Document: [facility]    Clinical Lists Changes  Observations: Added new observation of DIAB EYE EX: Mild non-proliferative diabetic retinopathy.    (10/05/2009 9:43)       Diabetic Eye Exam  Procedure date:  10/05/2009  Findings:      Mild non-proliferative diabetic retinopathy.

## 2010-08-17 NOTE — Assessment & Plan Note (Signed)
Summary: URI   Vital Signs:  Patient profile:   77 year old male Weight:      155 pounds Temp:     99.0 degrees F oral Resp:     14 per minute BP sitting:   120 / 60  (left arm) Cuff size:   large  Vitals Entered By: Mervin Hack CMA Duncan Dull) (August 28, 2009 4:01 PM) CC: cold x1week   History of Present Illness: Wife got cold a week ago Now he got it  started for him 5-6 days ago raspy voice congestion and runny nose bad cough with slight mucus Lots of sneezing  Low grade fever last 2 nights Gets cold before dinner by no sweats Breathing is "rough"---some worse than usual but may be largely due to nasal congestion  Allergies: 1)  Lisinopril (Lisinopril)  Past History:  Past medical, surgical, family and social histories (including risk factors) reviewed for relevance to current acute and chronic problems.  Past Medical History: Reviewed history from 06/09/2009 and no changes required. Allergic rhinitis Anemia-NOS Coronary artery disease s/p CABG 1995 with redo 1999 and AVR 1999 Diabetes mellitus, type II Hyperlipidemia Hypertension Osteoarthritis Benign prostatic hypertrophy Diverticulosis, colon--with repeated bleeds Atrial fibrillation/flutter, 11/09 Depression Congestive heart failure  Past Surgical History: Reviewed history from 12/22/2008 and no changes required. Tonsilectomy/Adenoidectomy---1933 Coronary artery bypass graft--1995, redo 1999 Hemorrhoidectomy--1974 Transurethral resection of prostate--1993 Nasal repair--1996 Aortic valve replacement--pig valve---1999 Right shoulder replacement--1992 Left retinal vitrectomy --2006 Cataracts--1999 Rectal bleeding --numerous times from divertuculosis  Family History: Reviewed history from 05/16/2008 and no changes required. Dad died of sucide @49  Mom died @52  of cancer (male) Only child Not clear about his distant history but there were some long lived grandparents Mat GF died of  Parkinson's  Social History: Reviewed history from 05/16/2008 and no changes required. 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 1 son retarded--lives in group home None in the area Former Smoker--quit 1952 Alcohol use-yes  Has living will. Wife has health care POA and then son, Weston Brass No prior discussions about DNR but he very much wants it. Does not want a feeding tube  Review of Systems       No nausea or vomiting appetite is fine  Physical Exam  General:  alert.  NAD Head:  mild maxillary and frontal tenderness Nose:  moderate inflammation with some thick green mucus bilat Mouth:  no erythema and no exudates.   Neck:  supple, no masses, and no cervical lymphadenopathy.   Lungs:  normal respiratory effort, no intercostal retractions, no accessory muscle use, and normal breath sounds.     Impression & Recommendations:  Problem # 1:  SINUSITIS - ACUTE-NOS (ICD-461.9) Assessment New likely bacterial infection with tenderness and purulent mucus seen from the turbinates will try amoxil analgesics  Complete Medication List: 1)  Metoprolol Tartrate 25 Mg Tabs (Metoprolol tartrate) .Marland Kitchen.. 1 by mouth once daily 2)  Glipizide 10 Mg Tabs (Glipizide) .Marland Kitchen.. 1 tablet two times a day by mouth 3)  Ferrex 150 Forte Plus 50-100 Mg Caps (Fe-succ ac-c-thre ac-b12-fa) .Marland Kitchen.. 1 tablet at bedtime by mouth 4)  Aspirin Adult Low Strength 81 Mg Tbec (Aspirin) .... Take 1 tablet by mouth every other day 5)  Fish Oil Concentrate 1000 Mg Caps (Omega-3 fatty acids) .... By mouth past breakfast 6)  Multivitamins Tabs (Multiple vitamin) .Marland Kitchen.. 1 past breakfast by mouth 7)  Metamucil 30.9 % Powd (Psyllium) .Marland Kitchen.. 1 teaspoon at bedtime 8)  Ranitidine Hcl 75 Mg Tabs (Ranitidine hcl) .... 2 tablets at  bedtime 9)  Mylanta 200-200-20 Mg/76ml Susp (Alum & mag hydroxide-simeth) .... As needed as needed 10)  Warfarin Sodium 2.5 Mg Tabs (Warfarin sodium) .Marland Kitchen.. 1 tablet by mouth daily 11)   Furosemide 40 Mg Tabs (Furosemide) .... Take 1-2 by mouth once daily as needed 12)  Isosorbide Mononitrate Cr 30 Mg Xr24h-tab (Isosorbide mononitrate) .... Take 1 by mouth once daily 13)  Ipratropium Bromide 0.03 % Soln (Ipratropium bromide) .... As needed 14)  Simethicone 80 Mg Chew (Simethicone) .... As needed 15)  Cozaar 50 Mg Tabs (Losartan potassium) .... Take 1 by mouth once daily 16)  Hydrocodone-acetaminophen 5-325 Mg Tabs (Hydrocodone-acetaminophen) .Marland Kitchen.. 1-2 tabs at bedtime as needed for severe leg pain 17)  Amoxicillin 500 Mg Tabs (Amoxicillin) .... 2 tabs by mouth two times a day for sinus infection  Patient Instructions: 1)  Please keep regular follow up 2)  Call next week if not better on the antibiotic Prescriptions: AMOXICILLIN 500 MG TABS (AMOXICILLIN) 2 tabs by mouth two times a day for sinus infection  #40 x 0   Entered and Authorized by:   Cindee Salt MD   Signed by:   Cindee Salt MD on 08/28/2009   Method used:   Electronically to        [id number] (ret[facility])       [address]       [place], [place]  [address]       Ph: [phone]       Fax: [phone]   RxID:   [id number]   Current Allergies (reviewed today): LISINOPRIL (LISINOPRIL)

## 2010-08-17 NOTE — Assessment & Plan Note (Signed)
Summary: ROA 6 MTHS CYD   Vital Signs:  Patient profile:   77 year old male Weight:      149 pounds BMI:     22.74 Temp:     97.7 degrees F oral Pulse rate:   60 / minute Pulse rhythm:   irregular BP sitting:   128 / 60  (left arm) Cuff size:   large  Vitals Entered By: Mervin Hack CMA Duncan Dull) (Nov 24, 2009 10:44 AM) CC: 6 month follow-up   History of Present Illness: Feels fairly well Much better than 6 months ago Has kept up with cardiology recent echo okay  Breathing  okay Stable DOE--needs to rest after 1/4 mile flat. Can do a flight of stairs without stopping Sleeps in recliner at times--mostly for right shoulder No PND Occ palpitations if overdoes activity Occ spasm in left ankle but no sig swelling Does take double dose of lasix if legs swell some  Takes sugar daily 79- 167 Did have hard time "getting going" with the 79 but no set hypoglycemic reactions No foot pain or sig numbness  saw orthopedist--told to take celebrex. wife didn't want him to take it I recommended tylenol--celebrex not a good idea  Allergies: 1)  Lisinopril (Lisinopril)  Past History:  Past medical, surgical, family and social histories (including risk factors) reviewed for relevance to current acute and chronic problems.  Past Medical History: Reviewed history from 06/09/2009 and no changes required. Allergic rhinitis Anemia-NOS Coronary artery disease s/p CABG 1995 with redo 1999 and AVR 1999 Diabetes mellitus, type II Hyperlipidemia Hypertension Osteoarthritis Benign prostatic hypertrophy Diverticulosis, colon--with repeated bleeds Atrial fibrillation/flutter, 11/09 Depression Congestive heart failure  Past Surgical History: Reviewed history from 12/22/2008 and no changes required. Tonsilectomy/Adenoidectomy---1933 Coronary artery bypass graft--1995, redo 1999 Hemorrhoidectomy--1974 Transurethral resection of prostate--1993 Nasal repair--1996 Aortic valve  replacement--pig valve---1999 Right shoulder replacement--1992 Left retinal vitrectomy --2006 Cataracts--1999 Rectal bleeding --numerous times from divertuculosis  Family History: Reviewed history from 05/16/2008 and no changes required. Dad died of sucide @49  Mom died @52  of cancer (male) Only child Not clear about his distant history but there were some long lived grandparents Mat GF died of Parkinson's  Social History: Reviewed history from 05/16/2008 and no changes required. 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 1 son retarded--lives in group home None in the area Former Smoker--quit 1952 Alcohol use-yes  Has living will. Wife has health care POA and then son, Weston Brass No prior discussions about DNR but he very much wants it. Does not want a feeding tube  Review of Systems       appetite is okay weight is down 6# from last time sleep is variable--winds up in recliner at times for shoulder comfort Mild tremor with eating--very slow progression only   Physical Exam  General:  alert and normal appearance.   Neck:  supple, no masses, no thyromegaly, and no cervical lymphadenopathy.   Lungs:  normal respiratory effort and normal breath sounds.   Heart:  normal rate, no gallop, and irregular rhythm.   Gr 2/6 aortic systolic murmur Pulses:  not palpable Extremities:  trace ankle edema Skin:  no suspicious lesions and no ulcerations.   Psych:  normally interactive, good eye contact, not anxious appearing, and not depressed appearing.    Diabetes Management Exam:    Foot Exam (with socks and/or shoes not present):       Sensory-Pinprick/Light touch:          Left medial foot (L-4): diminished  Left dorsal foot (L-5): diminished          Left lateral foot (S-1): diminished          Right medial foot (L-4): diminished          Right dorsal foot (L-5): diminished          Right lateral foot (S-1): diminished       Inspection:           Left foot: normal          Right foot: normal       Nails:          Left foot: fungal infection          Right foot: fungal infection   Impression & Recommendations:  Problem # 1:  DIABETES MELLITUS, TYPE II (ICD-250.00) Assessment Unchanged  doing well may want to decrease glipizide  His updated medication list for this problem includes:    Glipizide 10 Mg Tabs (Glipizide) .Marland Kitchen... 1 tablet two times a day by mouth    Aspirin Adult Low Strength 81 Mg Tbec (Aspirin) .Marland Kitchen... Take 1 tablet by mouth every other day    Cozaar 50 Mg Tabs (Losartan potassium) .Marland Kitchen... Take 1 by mouth once daily  Labs Reviewed: Creat: 1.3 (05/18/2009)     Last Eye Exam: Mild non-proliferative diabetic retinopathy.    (10/05/2009) Reviewed HgBA1c results: 8.4 (05/18/2009)  8.8 (11/14/2008)  Orders: TLB-A1C / Hgb A1C (Glycohemoglobin) (83036-A1C)  Problem # 2:  ATRIAL FIBRILLATION (ICD-427.31) Assessment: Unchanged good rate control  on coumadin  His updated medication list for this problem includes:    Metoprolol Tartrate 25 Mg Tabs (Metoprolol tartrate) .Marland Kitchen... 1 by mouth once daily    Aspirin Adult Low Strength 81 Mg Tbec (Aspirin) .Marland Kitchen... Take 1 tablet by mouth every other day    Warfarin Sodium 2.5 Mg Tabs (Warfarin sodium) .Marland Kitchen... 1 tablet by mouth daily  Problem # 3:  ACUTE COMBINED SYSTOLIC&DIASTOLIC HEART FAILURE (ICD-428.41) Assessment: Unchanged stable NYHA class 2  neutral fluid status  His updated medication list for this problem includes:    Metoprolol Tartrate 25 Mg Tabs (Metoprolol tartrate) .Marland Kitchen... 1 by mouth once daily    Aspirin Adult Low Strength 81 Mg Tbec (Aspirin) .Marland Kitchen... Take 1 tablet by mouth every other day    Warfarin Sodium 2.5 Mg Tabs (Warfarin sodium) .Marland Kitchen... 1 tablet by mouth daily    Furosemide 40 Mg Tabs (Furosemide) .Marland Kitchen... Take 1-2 by mouth once daily as needed    Cozaar 50 Mg Tabs (Losartan potassium) .Marland Kitchen... Take 1 by mouth once daily  Problem # 4:  HYPERLIPIDEMIA  (ICD-272.4) Assessment: Unchanged  on fish oil only may not need if trig very low  Labs Reviewed: SGOT: 39 (05/18/2009)   SGPT: 39 (05/18/2009)   HDL:35.3 (05/16/2008)  LDL:96 (05/16/2008)  Chol:149 (05/16/2008)  Trig:91 (05/16/2008)  Orders: TLB-Lipid Panel (80061-LIPID) TLB-Hepatic/Liver Function Pnl (80076-HEPATIC) Venipuncture (04540)  Complete Medication List: 1)  Metoprolol Tartrate 25 Mg Tabs (Metoprolol tartrate) .Marland Kitchen.. 1 by mouth once daily 2)  Glipizide 10 Mg Tabs (Glipizide) .Marland Kitchen.. 1 tablet two times a day by mouth 3)  Aspirin Adult Low Strength 81 Mg Tbec (Aspirin) .... Take 1 tablet by mouth every other day 4)  Multivitamins Tabs (Multiple vitamin) .Marland Kitchen.. 1 past breakfast by mouth 5)  Ranitidine Hcl 75 Mg Tabs (Ranitidine hcl) .... 2 tablets at bedtime 6)  Warfarin Sodium 2.5 Mg Tabs (Warfarin sodium) .Marland Kitchen.. 1 tablet by mouth daily 7)  Furosemide 40 Mg Tabs (  Furosemide) .... Take 1-2 by mouth once daily as needed 8)  Isosorbide Mononitrate Cr 30 Mg Xr24h-tab (Isosorbide mononitrate) .... Take 1 by mouth once daily 9)  Cozaar 50 Mg Tabs (Losartan potassium) .... Take 1 by mouth once daily 10)  Ipratropium Bromide 0.03 % Soln (Ipratropium bromide) .... As needed 11)  Simethicone 80 Mg Chew (Simethicone) .... As needed 12)  Hydrocodone-acetaminophen 5-325 Mg Tabs (Hydrocodone-acetaminophen) .Marland Kitchen.. 1-2 tabs at bedtime as needed for severe leg pain 13)  Ferrex 150 Forte Plus 50-100 Mg Caps (Fe-succ ac-c-thre ac-b12-fa) .Marland Kitchen.. 1 tablet at bedtime by mouth 14)  Fish Oil Concentrate 1000 Mg Caps (Omega-3 fatty acids) .... By mouth past breakfast 15)  Metamucil 30.9 % Powd (Psyllium) .Marland Kitchen.. 1 teaspoon at bedtime 16)  Mylanta 200-200-20 Mg/37ml Susp (Alum & mag hydroxide-simeth) .... As needed as needed  Other Orders: TLB-Renal Function Panel (80069-RENAL) TLB-CBC Platelet - w/Differential (85025-CBCD) TLB-TSH (Thyroid Stimulating Hormone) (16109-UEA)  Patient Instructions: 1)  Please  schedule a follow-up appointment in 6 months .   Current Allergies (reviewed today): LISINOPRIL (LISINOPRIL)  Appended Document: ROA 6 MTHS CYD  Laboratory Results   Blood Tests   Date/Time Recieved: Nov 24, 2009 11:42 AM  Date/Time Reported: Nov 24, 2009 11:42 AM   PT: 26.7 s   (Normal Range: 10.6-13.4)  INR: 2.2   (Normal Range: 0.88-1.12   Therap INR: 2.0-3.5)      ANTICOAGULATION RECORD PREVIOUS REGIMEN & LAB RESULTS Anticoagulation Diagnosis:  Atrial fibrillation on  06/09/2009 Previous INR Goal Range:  2.0-3.5 on  06/13/2008 Previous INR:  2.5 on  11/05/2009 Previous Coumadin Dose(mg):  2.5mg  every day on  06/09/2009 Previous Regimen:  2.5mg  qd, 1.25thur on  09/24/2009 Previous Coagulation Comments:   . on  06/30/2009  NEW REGIMEN & LAB RESULTS Current INR: 2.2 Regimen: 2.5mg  qd, 1.25thur  (no change)  Provider: Hiedi Touchton      Repeat testing in: 4 weeks MEDICATIONS METOPROLOL TARTRATE 25 MG TABS (METOPROLOL TARTRATE) 1 by mouth once daily GLIPIZIDE 10 MG TABS (GLIPIZIDE) 1 tablet two times a day by mouth ASPIRIN ADULT LOW STRENGTH 81 MG TBEC (ASPIRIN) take 1 tablet by mouth every other day MULTIVITAMINS  TABS (MULTIPLE VITAMIN) 1 past breakfast by mouth RANITIDINE HCL 75 MG TABS (RANITIDINE HCL) 2 tablets at bedtime WARFARIN SODIUM 2.5 MG  TABS (WARFARIN SODIUM) 1 tablet by mouth daily FUROSEMIDE 40 MG TABS (FUROSEMIDE) take 1-2 by mouth once daily as needed ISOSORBIDE MONONITRATE CR 30 MG XR24H-TAB (ISOSORBIDE MONONITRATE) take 1 by mouth once daily COZAAR 50 MG TABS (LOSARTAN POTASSIUM) take 1 by mouth once daily IPRATROPIUM BROMIDE 0.03 % SOLN (IPRATROPIUM BROMIDE) as needed SIMETHICONE 80 MG CHEW (SIMETHICONE) as needed HYDROCODONE-ACETAMINOPHEN 5-325 MG TABS (HYDROCODONE-ACETAMINOPHEN) 1-2 tabs at bedtime as needed for severe leg pain FERREX 150 FORTE PLUS 50-100 MG CAPS (FE-SUCC AC-C-THRE AC-B12-FA) 1 tablet at bedtime by mouth FISH OIL CONCENTRATE 1000 MG  CAPS (OMEGA-3 FATTY ACIDS) by mouth past breakfast METAMUCIL 30.9 % POWD (PSYLLIUM) 1 teaspoon at bedtime MYLANTA 200-200-20 MG/5ML SUSP (ALUM & MAG HYDROXIDE-SIMETH) as needed as needed   Anticoagulation Visit Questionnaire      Coumadin dose missed/changed:  No      Abnormal Bleeding Symptoms:  No Any diet changes including alcohol intake, vegetables or greens since the last visit:  No Any illnesses or hospitalizations since the last visit:  No Any signs of clotting since the last visit (including chest discomfort, dizziness, shortness of breath, arm tingling, slurred speech, swelling or redness  in leg):  No

## 2010-08-17 NOTE — Assessment & Plan Note (Signed)
Summary: 6 month follow up/rbh   Vital Signs:  Patient profile:   77 year old [place] Weight:      146 pounds Temp:     97.6 degrees F oral Pulse rate:   60 / minute Pulse rhythm:   regular BP sitting:   140 / 68  (left arm) Cuff size:   regular  Vitals Entered By: Mervin Hack CMA Duncan Dull) (May 26, 2010 9:10 AM) CC: 6 month follow-up   History of Present Illness: Place on left arm is healing somewhat now No sig pain  Still having trouble with right shoulder Uses oxycodone for this Uses stool softener and this helps his bowels while on this considering injection there--but has messed sugars up  some back pain as well--- had MRI done and was referred to pain clinic getting PT at [facility]  checks sugars daily Most under 140 and some under 100 No sig hypogylcemic reactions Mild twinges in ankles but no sig foot pain  Heart okay No palpitations but does feel it irregular No chest pain Gets stuffed up in head--some SOB with this  No change in limited exercise tolerance  Allergies: 1)  Lisinopril (Lisinopril)  Past History:  Past medical, surgical, family and social histories (including risk factors) reviewed for relevance to current acute and chronic problems.  Past Medical History: Reviewed history from 06/09/2009 and no changes required. Allergic rhinitis Anemia-NOS Coronary artery disease s/p CABG 1995 with redo 1999 and AVR 1999 Diabetes mellitus, type II Hyperlipidemia Hypertension Osteoarthritis Benign prostatic hypertrophy Diverticulosis, colon--with repeated bleeds Atrial fibrillation/flutter, 11/09 Depression Congestive heart failure  Past Surgical History: Reviewed history from 12/22/2008 and no changes required. Tonsilectomy/Adenoidectomy---1933 Coronary artery bypass graft--1995, redo 1999 Hemorrhoidectomy--1974 Transurethral resection of prostate--1993 Nasal repair--1996 Aortic valve replacement--pig valve---1999 Right shoulder  replacement--1992 Left retinal vitrectomy --2006 Cataracts--1999 Rectal bleeding --numerous times from divertuculosis  Family History: Reviewed history from 05/16/2008 and no changes required. Dad died of sucide @[place]  Mom died @52  of cancer ([place]) Only child Not clear about his distant history but there were some long lived grandparents Mat GF died of Parkinson's  Social History: Reviewed history from 05/16/2008 and no changes required. 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 1 son retarded--lives in group home None in the area Former Smoker--quit 1952 Alcohol use-yes  Has living will. Wife has health care POA and then son, Weston Brass No prior discussions about DNR but he very much wants it. Does not want a feeding tube  Review of Systems       appetite is fine weighs himself daily---fairly stable sleep is not that good--no sig change  Physical Exam  General:  alert.  NAD Neck:  supple, no masses, no thyromegaly, no carotid bruits, and no cervical lymphadenopathy.   Lungs:  normal respiratory effort, no intercostal retractions, no accessory muscle use, and normal breath sounds.   Heart:  normal rate, no gallop, and irregular rhythm.   Gr 3/6 systolic murmur loudest at base but also in apex Pulses:  faint in left foot, absent in right Extremities:  trace edema Skin:  no ulcerations.  Left arm lesion is just starting to partially granulate Psych:  normally interactive, good eye contact, not anxious appearing, and not depressed appearing.    Diabetes Management Exam:    Foot Exam (with socks and/or shoes not present):       Sensory-Pinprick/Light touch:          Left medial foot (L-4): diminished  Left dorsal foot (L-5): diminished          Left lateral foot (S-1): diminished          Right medial foot (L-4): diminished          Right dorsal foot (L-5): diminished          Right lateral foot (S-1): diminished       Inspection:           Left foot: normal          Right foot: normal       Nails:          Left foot: fungal infection          Right foot: fungal infection   Impression & Recommendations:  Problem # 1:  OSTEOARTHRITIS (ICD-715.90) Assessment Comment Only  back and shoulder pain Rx from pain clinic will have PT address right shoulder--injection not a good idea with partial replacement there  The following medications were removed from the medication list:    Hydrocodone-acetaminophen 5-325 Mg Tabs (Hydrocodone-acetaminophen) .Marland Kitchen... 1-2 tabs at bedtime as needed for severe leg pain His updated medication list for this problem includes:    Aspirin Adult Low Strength 81 Mg Tbec (Aspirin) .Marland Kitchen... Take 1 tablet by mouth every other day    Oxycodone Hcl 5 Mg Tabs (Oxycodone hcl) .Marland Kitchen... 1/2- 1 tab by mouth three times a day as needed for pain  Orders: Physical Therapy Referral (PT)  Problem # 2:  ACUTE COMBINED SYSTOLIC&DIASTOLIC HEART FAILURE (ICD-428.41) Assessment: Comment Only stable status or perhaps slight decline compensated and neutral fluid status  His updated medication list for this problem includes:    Metoprolol Tartrate 25 Mg Tabs (Metoprolol tartrate) .Marland Kitchen... Take 1/2  tablet by mouth twice a day    Warfarin Sodium 2.5 Mg Tabs (Warfarin sodium) .Marland Kitchen... 1 tablet by mouth daily    Furosemide 40 Mg Tabs (Furosemide) .Marland Kitchen... Take 1-2 by mouth once daily as needed    Cozaar 50 Mg Tabs (Losartan potassium) .Marland Kitchen... Take 1 by mouth once daily    Aspirin Adult Low Strength 81 Mg Tbec (Aspirin) .Marland Kitchen... Take 1 tablet by mouth every other day  Problem # 3:  DIABETES MELLITUS, TYPE II (ICD-250.00) Assessment: Unchanged  still seems to have reasonable control will check A1c  His updated medication list for this problem includes:    Glipizide 10 Mg Tabs (Glipizide) .Marland Kitchen... 1 tablet two times a day by mouth    Cozaar 50 Mg Tabs (Losartan potassium) .Marland Kitchen... Take 1 by mouth once daily    Aspirin Adult Low Strength 81  Mg Tbec (Aspirin) .Marland Kitchen... Take 1 tablet by mouth every other day  Orders: TLB-A1C / Hgb A1C (Glycohemoglobin) (83036-A1C)  Problem # 4:  ATRIAL FIBRILLATION (ICD-427.31) Assessment: Unchanged good rate control on coumadin  His updated medication list for this problem includes:    Metoprolol Tartrate 25 Mg Tabs (Metoprolol tartrate) .Marland Kitchen... Take 1/2  tablet by mouth twice a day    Warfarin Sodium 2.5 Mg Tabs (Warfarin sodium) .Marland Kitchen... 1 tablet by mouth daily    Aspirin Adult Low Strength 81 Mg Tbec (Aspirin) .Marland Kitchen... Take 1 tablet by mouth every other day  Problem # 5:  ANEMIA-NOS (ICD-285.9) Assessment: Unchanged will stop iron if the Hgb still okay  His updated medication list for this problem includes:    Ferrex 150 Forte Plus 50-100 Mg Caps (Fe-succ ac-c-thre ac-b12-fa) .Marland Kitchen... 1 tablet at bedtime by mouth  Hgb: 12.4 (11/24/2009)   Hct:  36.4 (11/24/2009)   Platelets: 134.0 (11/24/2009) RBC: 3.63 (11/24/2009)   RDW: 15.0 (11/24/2009)   WBC: 5.7 (11/24/2009) MCV: 100.2 (11/24/2009)   MCHC: 34.0 (11/24/2009) TSH: 6.53 (12/22/2009)  Problem # 6:  HYPERTENSION (ICD-401.9) Assessment: Unchanged  reasonable control on meds  His updated medication list for this problem includes:    Metoprolol Tartrate 25 Mg Tabs (Metoprolol tartrate) .Marland Kitchen... Take 1/2  tablet by mouth twice a day    Furosemide 40 Mg Tabs (Furosemide) .Marland Kitchen... Take 1-2 by mouth once daily as needed    Cozaar 50 Mg Tabs (Losartan potassium) .Marland Kitchen... Take 1 by mouth once daily  BP today: 140/68 Prior BP: 128/70 (05/17/2010)  Labs Reviewed: K+: 4.5 (11/24/2009) Creat: : 1.1 (11/24/2009)   Chol: 116 (11/24/2009)   HDL: 37.00 (11/24/2009)   LDL: 66 (11/24/2009)   TG: 67.0 (11/24/2009)  Orders: TLB-Renal Function Panel (80069-RENAL) TLB-CBC Platelet - w/Differential (85025-CBCD) TLB-Hepatic/Liver Function Pnl (80076-HEPATIC) Venipuncture (16109)  Complete Medication List: 1)  Levothyroxine Sodium 25 Mcg Tabs (Levothyroxine sodium)  .... Take one by mouth daily 2)  Metoprolol Tartrate 25 Mg Tabs (Metoprolol tartrate) .... Take 1/2  tablet by mouth twice a day 3)  Glipizide 10 Mg Tabs (Glipizide) .Marland Kitchen.. 1 tablet two times a day by mouth 4)  Warfarin Sodium 2.5 Mg Tabs (Warfarin sodium) .Marland Kitchen.. 1 tablet by mouth daily 5)  Furosemide 40 Mg Tabs (Furosemide) .... Take 1-2 by mouth once daily as needed 6)  Isosorbide Mononitrate Cr 30 Mg Xr24h-tab (Isosorbide mononitrate) .... Take 1 by mouth once daily 7)  Cozaar 50 Mg Tabs (Losartan potassium) .... Take 1 by mouth once daily 8)  Ipratropium Bromide 0.03 % Soln (Ipratropium bromide) .... As needed 9)  Simethicone 80 Mg Chew (Simethicone) .... As needed 10)  Ferrex 150 Forte Plus 50-100 Mg Caps (Fe-succ ac-c-thre ac-b12-fa) .Marland Kitchen.. 1 tablet at bedtime by mouth 11)  Metamucil 30.9 % Powd (Psyllium) .Marland Kitchen.. 1 teaspoon at bedtime 12)  Mylanta 200-200-20 Mg/20ml Susp (Alum & mag hydroxide-simeth) .... As needed as needed 13)  Bayer Breeze 2 Test Disk (Glucose blood) .... Test up to 4 times per day. 14)  Aspirin Adult Low Strength 81 Mg Tbec (Aspirin) .... Take 1 tablet by mouth every other day 15)  Multivitamins Tabs (Multiple vitamin) .Marland Kitchen.. 1 past breakfast by mouth 16)  Ranitidine Hcl 75 Mg Tabs (Ranitidine hcl) .... 2 tablets at bedtime 17)  Oxycodone Hcl 5 Mg Tabs (Oxycodone hcl) .... 1/2- 1 tab by mouth three times a day as needed for pain  Other Orders: TLB-T4 (Thyrox), Free [phone])  Patient Instructions: 1)  Please make appt with your dermatologist if the left arm area is not healing well within about 2 weeks. 2)  Please set up referral to [facility] health care 3)  Please schedule a follow-up appointment in 6 months .    Orders Added: 1)  Est. Patient Level IV [19147] 2)  TLB-Renal Function Panel [80069-RENAL] 3)  TLB-CBC Platelet - w/Differential [85025-CBCD] 4)  TLB-Hepatic/Liver Function Pnl [80076-HEPATIC] 5)  Venipuncture [36415] 6)  TLB-T4 (Thyrox), Free  [82956-OZ3Y] 7)  TLB-A1C / Hgb A1C (Glycohemoglobin) [83036-A1C] 8)  Physical Therapy Referral [PT]    Current Allergies (reviewed today): LISINOPRIL (LISINOPRIL)

## 2010-08-17 NOTE — Assessment & Plan Note (Signed)
Summary: f48m   Visit Type:  Follow-up Primary Provider:  Cindee Salt MD  CC:  Shortness of breath; no more than usual.  Has swelling in legs mostly at night..  History of Present Illness: 77 yo WM with history of DM, HTN, hyperlipidemia, CAD s/p CABG and redo, s/p porcine AVR and  diagnosis of atrial fibrillation in November 2009 on coumadin therapy who comes in today for cardiac follow up.   He is doing well overall. He does report lack of sleep and this seems to be because of right shoulder pain which is chronic following a shoulder replacement surgery. He also has severe chronic low back pain.  His lower extremity swelling has been better on increased dose of Lasix.  He denies any chest pain.   He does report occasional dizzy episodes, once falling out of his chair. In November he had a head laceration from falling asleep in the chair. he takes metoprolol tartrate 25 mg in the morning with no dose in the evening.   Echo in December of 2009 showed normal LV function with normally functioning AVR and moderate MR.   His overall energy level has not been as good since he has been in atrial fibrillation.  He has had some pain in his legs with ambulation but arterial dopplers in may 2010 showed no obstructive lower extremity arterial disease.   EKG shows H. rotablation with ventricular rate of 50 beats per minute poor R wave progression through the precordial leads, nonspecific ST changes diffusely, intraventricular conduction delay.    Current Medications (verified): 1)  Metoprolol Tartrate 25 Mg Tabs (Metoprolol Tartrate) .Marland Kitchen.. 1 By Mouth Once Daily 2)  Glipizide 10 Mg Tabs (Glipizide) .Marland Kitchen.. 1 Tablet Two Times A Day By Mouth 3)  Aspirin Adult Low Strength 81 Mg Tbec (Aspirin) .... Take 1 Tablet By Mouth Every Other Day 4)  Multivitamins  Tabs (Multiple Vitamin) .Marland Kitchen.. 1 Past Breakfast By Mouth 5)  Ranitidine Hcl 75 Mg Tabs (Ranitidine Hcl) .... 2 Tablets At Bedtime 6)  Warfarin Sodium  2.5 Mg  Tabs (Warfarin Sodium) .Marland Kitchen.. 1 Tablet By Mouth Daily 7)  Furosemide 40 Mg Tabs (Furosemide) .... Take 1-2 By Mouth Once Daily As Needed 8)  Isosorbide Mononitrate Cr 30 Mg Xr24h-Tab (Isosorbide Mononitrate) .... Take 1 By Mouth Once Daily 9)  Cozaar 50 Mg Tabs (Losartan Potassium) .... Take 1 By Mouth Once Daily 10)  Ipratropium Bromide 0.03 % Soln (Ipratropium Bromide) .... As Needed 11)  Simethicone 80 Mg Chew (Simethicone) .... As Needed 12)  Hydrocodone-Acetaminophen 5-325 Mg Tabs (Hydrocodone-Acetaminophen) .Marland Kitchen.. 1-2 Tabs At Bedtime As Needed For Severe Leg Pain 13)  Ferrex 150 Forte Plus 50-100 Mg Caps (Fe-Succ Ac-C-Thre Ac-B12-Fa) .Marland Kitchen.. 1 Tablet At Bedtime By Mouth 14)  Metamucil 30.9 % Powd (Psyllium) .Marland Kitchen.. 1 Teaspoon At Bedtime 15)  Mylanta 200-200-20 Mg/25ml Susp (Alum & Mag Hydroxide-Simeth) .... As Needed As Needed 16)  Levothyroxine Sodium 25 Mcg Tabs (Levothyroxine Sodium) .... Take One By Mouth Daily 17)  Bayer Breeze 2 Test  Disk (Glucose Blood) .... Test Up To 4 Times Per Day.  Allergies (verified): 1)  Lisinopril (Lisinopril)  Past History:  Past Medical History: Last updated: 06/09/2009 Allergic rhinitis Anemia-NOS Coronary artery disease s/p CABG 1995 with redo 1999 and AVR 1999 Diabetes mellitus, type II Hyperlipidemia Hypertension Osteoarthritis Benign prostatic hypertrophy Diverticulosis, colon--with repeated bleeds Atrial fibrillation/flutter, 11/09 Depression Congestive heart failure  Past Surgical History: Last updated: 12/22/2008 Tonsilectomy/Adenoidectomy---1933 Coronary artery bypass graft--1995, redo 1999 Hemorrhoidectomy--1974 Transurethral  resection of prostate--1993 Nasal repair--1996 Aortic valve replacement--pig valve---1999 Right shoulder replacement--1992 Left retinal vitrectomy --2006 Cataracts--1999 Rectal bleeding --numerous times from divertuculosis  Family History: Last updated: 05-24-2008 Dad died of sucide @49  Mom died  @52  of cancer (male) Only child Not clear about his distant history but there were some long lived grandparents Mat GF died of Parkinson's  Social History: Last updated: 05/24/08 Retired---own business--metallurgical Designer, 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 1 son retarded--lives in group home None in the area Former Smoker--quit 1952 Alcohol use-yes  Has living will. Wife has health care POA and then son, Weston Brass No prior discussions about DNR but he very much wants it. Does not want a feeding tube  Risk Factors: Alcohol Use: 1 (02/26/2009)  Risk Factors: Smoking Status: quit (02/26/2009)  Review of Systems  The patient denies fever, weight loss, weight gain, vision loss, decreased hearing, hoarseness, chest pain, syncope, dyspnea on exertion, peripheral edema, prolonged cough, abdominal pain, incontinence, muscle weakness, depression, and enlarged lymph nodes.         dizzy episodes, insomnia  Vital Signs:  Patient profile:   77 year old male Height:      68 inches Weight:      145.50 pounds Pulse rate:   62 / minute BP sitting:   131 / 68  (left arm)  Vitals Entered By: Bishop Dublin, CMA (January 26, 2010 10:18 AM)  Physical Exam  General:  Well developed, well nourished, in no acute distress. Head:  normocephalic and atraumatic Neck:  Neck supple, no JVD. No masses, thyromegaly or abnormal cervical nodes. Lungs:  Clear bilaterally to auscultation and percussion. Heart:  Non-displaced PMI, chest non-tender; regular rate and rhythm, S1, S2 . Carotid upstroke normal, no bruit.  Pedals normal pulses. Trace woody edema b/l LE, no varicosities.Grade II /6 SEM loudest primary aortic area Abdomen:  Bowel sounds positive; abdomen soft and non-tender without masses Msk:  Back normal, normal gait. Muscle strength and tone normal. Pulses:  pulses normal in all 4 extremities Extremities:  No clubbing or cyanosis. Neurologic:  Alert and oriented x 3. Skin:  Intact without lesions or  rashes. Psych:  Normal affect.   New Orders:     1)  EKG w/ Interpretation (93000)   Impression & Recommendations:  Problem # 1:  ATRIAL FIBRILLATION (ICD-427.31) chronic atrial fibrillation on Coumadin. He has a very slow ventricular rate and we will decrease his metoprolol to 12.5 mg b.i.d. I asked him to contact me if he has worsening symptoms of dizziness or lightheadedness. He may require a Holter monitor for worsening symptoms.  His updated medication list for this problem includes:    Metoprolol Tartrate 25 Mg Tabs (Metoprolol tartrate) .Marland Kitchen... Take 1/2  tablet by mouth once a day    Aspirin Adult Low Strength 81 Mg Tbec (Aspirin) .Marland Kitchen... Take 1 tablet by mouth every other day    Warfarin Sodium 2.5 Mg Tabs (Warfarin sodium) .Marland Kitchen... 1 tablet by mouth daily  Orders: EKG w/ Interpretation (93000)  Problem # 2:  EDEMA (ICD-782.3) Chronic lower extremity edema likely due to diastolic dysfunction, A. fib with aortic valve regurgitation and mild stenosis from his aVR. He seems to be stable on his diuretic regimen.  Problem # 3:  HYPERLIPIDEMIA (ICD-272.4) His cholesterol is well within goal though he is not on any cholesterol medication. I hesitate to start a lipid medication with his numbers as they are, LDL of 60  Problem # 4:  CORONARY ARTERY DISEASE (ICD-414.00) no symptoms suggestive of angina.  We have encouraged him to participate in regular exercise.  His updated medication list for this problem includes:    Metoprolol Tartrate 25 Mg Tabs (Metoprolol tartrate) .Marland Kitchen... Take 1/2  tablet by mouth once a day    Aspirin Adult Low Strength 81 Mg Tbec (Aspirin) .Marland Kitchen... Take 1 tablet by mouth every other day    Warfarin Sodium 2.5 Mg Tabs (Warfarin sodium) .Marland Kitchen... 1 tablet by mouth daily    Isosorbide Mononitrate Cr 30 Mg Xr24h-tab (Isosorbide mononitrate) .Marland Kitchen... Take 1 by mouth once daily  Patient Instructions: 1)  Your physician has recommended you make the following change in your  medication: Decrease metoprolol to 1/2 tab two times a day  2)  Your physician wants you to follow-up in:   6 months You will receive a reminder letter in the mail two months in advance. If you don't receive a letter, please call our office to schedule the follow-up appointment.

## 2010-08-17 NOTE — Progress Notes (Signed)
Summary: COUMADIN  Phone Note Call from Patient Call back at Home Phone [phone]   Caller: Patient Call For: Kaylor Simenson Summary of Call: PATIENT WOULD LIKE TO KNOW IF HE COULD CUT BACK ON HIS COUMADIN DUE TO BLEEDING ALL THE TIME.   Initial call taken by: West Carbo,  February 11, 2010 10:01 AM  Follow-up for Phone Call        pt's coumadin is followed by Dr. Alphonsus Sias.  the bleeding pt was reporting is cut on his arms and bruising. pt denies blood in stool. asked pt to call Dr. Karle Starch office since they are the ones who monitor his coumadin.  Follow-up by: Benedict Needy, RN,  February 11, 2010 11:16 AM

## 2010-08-17 NOTE — Miscellaneous (Signed)
Summary: Orders Update  Clinical Lists Changes  Orders: Added new Test order of Arterial Duplex Lower Extremity (Arterial Duplex Low) - Signed 

## 2010-08-17 NOTE — Consult Note (Signed)
Summary: [facility]  [facility]   Imported By: Lanelle Bal 02/05/2010 12:31:36  _____________________________________________________________________  External Attachment:    Type:   Image     Comment:   External Document

## 2010-08-17 NOTE — Progress Notes (Signed)
Summary: Orthopedic referral  Phone Note Call from Patient Call back at Home Phone [phone]   Caller: Patient Call For: Cindee Salt MD Summary of Call: Patient would like a referral to see an Orthopedic doctor.  Says he is having back pain and has been now for several years.  He is also having right shoulder pain, had shoulder surgery a few years ago.  Does not know any doctors in the area so we trust Dr. Karle Starch decision as to where to send him.  Please advise. Initial call taken by: Linde Gillis CMA Duncan Dull),  October 22, 2009 4:14 PM  Follow-up for Phone Call        okay to send to ortho [facility] [place] Follow-up by: Cindee Salt MD,  October 23, 2009 8:00 AM  Additional Follow-up for Phone Call Additional follow up Details #1::        Appt Dedra Skeens on 11/16/2009. Carlton Adam  October 23, 2009 10:29 AM  Additional Follow-up by: Carlton Adam,  October 23, 2009 10:29 AM

## 2010-08-19 NOTE — Assessment & Plan Note (Signed)
Summary: F6M/AMD   Visit Type:  Follow-up Primary Provider:  Cindee Salt MD  CC:  Denies chest pain.  Dylan Kitchen  History of Present Illness: 77 yo WM with history of DM, HTN, hyperlipidemia, CAD s/p CABG and redo, s/p porcine AVR and  diagnosis of atrial fibrillation in November 2009 on coumadin therapy who comes in today for cardiac follow up.  Dylan Perkins states that he recently had a "clot to his right eye " that has caused significant vision loss on the right. This happened one month ago. He states having difficulty maintaining his INR greater than 2.  He also has had poor sleep which has caused fatigue. He does go to exercise classes 3 times per week and uses the weight machines and the bike. He has had a recent cortisone shot in both ankles and has less pain. No significant chest pain, shortness of breath.   Echo in December of 2009 showed normal LV function with normally functioning AVR and moderate MR.   His overall energy level has not been as good since he has been in atrial fibrillation.  He has had some pain in his legs with ambulation but arterial dopplers in may 2010 showed no obstructive lower extremity arterial disease.   EKG shows atrial fibrillation with ventricular rate 73 beats per minute, intraventricular conduction delay, old anteroseptal infarct, left axis deviation    Current Medications (verified): 1)  Levothyroxine Sodium 25 Mcg Tabs (Levothyroxine Sodium) .... Take One By Mouth Daily 2)  Metoprolol Tartrate 25 Mg Tabs (Metoprolol Tartrate) .... Take 1/2  Tablet By Mouth Twice A Day 3)  Glipizide 10 Mg Tabs (Glipizide) .Dylan Kitchen.. 1 Tablet Two Times A Day By Mouth 4)  Warfarin Sodium 2.5 Mg  Tabs (Warfarin Sodium) .Dylan Kitchen.. 1 Tablet By Mouth Daily 5)  Furosemide 40 Mg Tabs (Furosemide) .... Take 1-2 By Mouth Once Daily As Needed 6)  Isosorbide Mononitrate Cr 30 Mg Xr24h-Tab (Isosorbide Mononitrate) .... Take 1 By Mouth Once Daily 7)  Cozaar 50 Mg Tabs (Losartan Potassium)  .... Take 1 By Mouth Once Daily 8)  Ipratropium Bromide 0.03 % Soln (Ipratropium Bromide) .... As Needed 9)  Simethicone 80 Mg Chew (Simethicone) .... As Needed 10)  Ferrex 150 Forte Plus 50-100 Mg Caps (Fe-Succ Ac-C-Thre Ac-B12-Fa) .Dylan Kitchen.. 1 Tablet At Bedtime By Mouth 11)  Metamucil 30.9 % Powd (Psyllium) .Dylan Kitchen.. 1 Teaspoon At Bedtime 12)  Mylanta 200-200-20 Mg/41ml Susp (Alum & Mag Hydroxide-Simeth) .... As Needed As Needed 13)  Bayer Breeze 2 Test  Disk (Glucose Blood) .... Test Up To 4 Times Per Day. 14)  Aspirin Adult Low Strength 81 Mg Tbec (Aspirin) .... Take 1 Tablet By Mouth Every Other Day 15)  Multivitamins  Tabs (Multiple Vitamin) .Dylan Kitchen.. 1 Past Breakfast By Mouth 16)  Ranitidine Hcl 75 Mg Tabs (Ranitidine Hcl) .... 2 Tablets At Bedtime 17)  Oxycodone Hcl 5 Mg Tabs (Oxycodone Hcl) .... 1/2- 1 Tab By Mouth Three Times A Day As Needed For Pain  Allergies (verified): 1)  Lisinopril (Lisinopril)  Past History:  Past Medical History: Last updated: 06/09/2009 Allergic rhinitis Anemia-NOS Coronary artery disease s/p CABG 1995 with redo 1999 and AVR 1999 Diabetes mellitus, type II Hyperlipidemia Hypertension Osteoarthritis Benign prostatic hypertrophy Diverticulosis, colon--with repeated bleeds Atrial fibrillation/flutter, 11/09 Depression Congestive heart failure  Past Surgical History: Last updated: 12/22/2008 Tonsilectomy/Adenoidectomy---1933 Coronary artery bypass graft--1995, redo 1999 Hemorrhoidectomy--1974 Transurethral resection of prostate--1993 Nasal repair--1996 Aortic valve replacement--pig valve---1999 Right shoulder replacement--1992 Left retinal vitrectomy --2006 Cataracts--1999 Rectal bleeding --  numerous times from divertuculosis  Family History: Last updated: Jun 10, 2008 Dad died of sucide @49  Mom died @52  of cancer (male) Only child Not clear about his distant history but there were some long lived grandparents Mat GF died of Parkinson's  Social  History: Last updated: Jun 10, 2008 Retired---own business--metallurgical Designer, 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 1 son retarded--lives in group home None in the area Former Smoker--quit 1952 Alcohol use-yes  Has living will. Wife has health care POA and then son, Weston Brass No prior discussions about DNR but he very much wants it. Does not want a feeding tube  Risk Factors: Alcohol Use: 1 (02/26/2009)  Risk Factors: Smoking Status: quit (02/26/2009)  Review of Systems       The patient complains of vision loss.  The patient denies fever, weight loss, weight gain, decreased hearing, hoarseness, chest pain, syncope, dyspnea on exertion, peripheral edema, prolonged cough, abdominal pain, incontinence, muscle weakness, depression, and enlarged lymph nodes.         vision problem on the right eye, fatigue, poor sleep  Vital Signs:  Patient profile:   77 year old male Height:      68 inches Weight:      147 pounds BMI:     22.43 Pulse rate:   73 / minute BP sitting:   122 / 58  (left arm) Cuff size:   regular  Vitals Entered By: Bishop Dublin, CMA (July 06, 2010 4:13 PM)  Physical Exam  General:  alert.  NAD Head:  normocephalic and atraumatic Neck:  Neck supple, no JVD. No masses, thyromegaly or abnormal cervical nodes. Lungs:  Clear bilaterally to auscultation and percussion. Heart:  Non-displaced PMI, chest non-tender; regular rate and rhythm, S1, S2 with II/VI SEM RSB, no rubs or gallops. Carotid upstroke normal, no bruit. Normal abdominal aortic size, no bruits. Femorals normal pulses, no bruits. Pedals normal pulses. 1+ woody  edema of LE b/l, no varicosities. Abdomen:  Bowel sounds positive; abdomen soft and non-tender without masses Msk:  Back normal, normal gait. Muscle strength and tone normal. Pulses:  decreased b/l DP/PT Extremities:  trace edema Neurologic:  Alert and oriented x 3. Skin:  Intact without lesions or rashes. Psych:  Normal affect.   Impression &  Recommendations:  Problem # 1:  ATRIAL FIBRILLATION (ICD-427.31) rate is adequately controlled on metoprolol. We have suggested that he take 5 mg warfarin once per week in an effort to increase his INR to greater than 2 ( given his recent embolic event to his right eye).  His updated medication list for this problem includes:    Metoprolol Tartrate 25 Mg Tabs (Metoprolol tartrate) .Dylan Kitchen... Take 1/2  tablet by mouth twice a day    Warfarin Sodium 2.5 Mg Tabs (Warfarin sodium) .Dylan Kitchen... 1 tablet by mouth daily    Aspirin Adult Low Strength 81 Mg Tbec (Aspirin) .Dylan Kitchen... Take 1 tablet by mouth every other day    Warfarin Sodium 5 Mg Tabs (Warfarin sodium) .Dylan Kitchen... Take one tablet one day a week.  Problem # 2:  ACUTE COMBINED SYSTOLIC&DIASTOLIC HEART FAILURE (ICD-428.41)  No symptoms of heart failure at this time. His edema is impressive though he feels comfortable taking Lasix once daily, and b.i.d. for significant edema.  His updated medication list for this problem includes:    Metoprolol Tartrate 25 Mg Tabs (Metoprolol tartrate) .Dylan Kitchen... Take 1/2  tablet by mouth twice a day    Warfarin Sodium 2.5 Mg Tabs (Warfarin sodium) .Dylan Kitchen... 1 tablet by mouth daily    Furosemide 40  Mg Tabs (Furosemide) .Dylan Kitchen... Take 1-2 by mouth once daily as needed    Isosorbide Mononitrate Cr 30 Mg Xr24h-tab (Isosorbide mononitrate) .Dylan Kitchen... Take 1 by mouth once daily    Cozaar 50 Mg Tabs (Losartan potassium) .Dylan Kitchen... Take 1 by mouth once daily    Aspirin Adult Low Strength 81 Mg Tbec (Aspirin) .Dylan Kitchen... Take 1 tablet by mouth every other day    Warfarin Sodium 5 Mg Tabs (Warfarin sodium) .Dylan Kitchen... Take one tablet one day a week.  His updated medication list for this problem includes:    Metoprolol Tartrate 25 Mg Tabs (Metoprolol tartrate) .Dylan Kitchen... Take 1/2  tablet by mouth twice a day    Warfarin Sodium 2.5 Mg Tabs (Warfarin sodium) .Dylan Kitchen... 1 tablet by mouth daily    Furosemide 40 Mg Tabs (Furosemide) .Dylan Kitchen... Take 1-2 by mouth once daily as needed     Isosorbide Mononitrate Cr 30 Mg Xr24h-tab (Isosorbide mononitrate) .Dylan Kitchen... Take 1 by mouth once daily    Cozaar 50 Mg Tabs (Losartan potassium) .Dylan Kitchen... Take 1 by mouth once daily    Aspirin Adult Low Strength 81 Mg Tbec (Aspirin) .Dylan Kitchen... Take 1 tablet by mouth every other day    Warfarin Sodium 5 Mg Tabs (Warfarin sodium) .Dylan Kitchen... Take one tablet one day a week.  Problem # 3:  EDEMA (ICD-782.3) His edema appears to be chronic. Ideally he should take Lasix b.i.d. but he is reluctant to do so on a regular basis.  Problem # 4:  HYPERLIPIDEMIA (ICD-272.4) he is not on any statin despite his disease. He does not want to start one. His cholesterol is quite low considering.  Problem # 5:  CAD, ARTERY BYPASS GRAFT (ICD-414.04) No symptoms of angina at this time. We will continue aggressive medical management. No further testing.  His updated medication list for this problem includes:    Metoprolol Tartrate 25 Mg Tabs (Metoprolol tartrate) .Dylan Kitchen... Take 1/2  tablet by mouth twice a day    Warfarin Sodium 2.5 Mg Tabs (Warfarin sodium) .Dylan Kitchen... 1 tablet by mouth daily    Isosorbide Mononitrate Cr 30 Mg Xr24h-tab (Isosorbide mononitrate) .Dylan Kitchen... Take 1 by mouth once daily    Aspirin Adult Low Strength 81 Mg Tbec (Aspirin) .Dylan Kitchen... Take 1 tablet by mouth every other day    Warfarin Sodium 5 Mg Tabs (Warfarin sodium) .Dylan Kitchen... Take one tablet one day a week.  Patient Instructions: 1)  Your physician recommends that you schedule a follow-up appointment in: 6 months 2)  Your physician has recommended you make the following change in your medication: CONTINUE Warfarin 2.5mg  once daily. ADD Warfarin 5mg  one day a week. Prescriptions: WARFARIN SODIUM 5 MG TABS (WARFARIN SODIUM) Take one tablet one day a week.  #30 x 3   Entered by:   Lanny Hurst RN   Authorized by:   Dossie Arbour MD   Signed by:   Lanny Hurst RN on 07/06/2010   Method used:   Electronically to        [id number]       [address]       [place], [place]  [address]       Ph: [phone]       Fax: [phone]   RxID:   [id number]

## 2010-08-23 ENCOUNTER — Ambulatory Visit (INDEPENDENT_AMBULATORY_CARE_PROVIDER_SITE_OTHER): Payer: Medicare Other

## 2010-08-23 ENCOUNTER — Encounter: Payer: Self-pay | Admitting: Internal Medicine

## 2010-08-23 DIAGNOSIS — I4891 Unspecified atrial fibrillation: Secondary | ICD-10-CM

## 2010-08-23 DIAGNOSIS — Z7901 Long term (current) use of anticoagulants: Secondary | ICD-10-CM

## 2010-08-23 DIAGNOSIS — Z5181 Encounter for therapeutic drug level monitoring: Secondary | ICD-10-CM

## 2010-09-20 ENCOUNTER — Ambulatory Visit (INDEPENDENT_AMBULATORY_CARE_PROVIDER_SITE_OTHER): Payer: Medicare Other

## 2010-09-20 ENCOUNTER — Encounter: Payer: Self-pay | Admitting: Family Medicine

## 2010-09-20 DIAGNOSIS — Z7901 Long term (current) use of anticoagulants: Secondary | ICD-10-CM

## 2010-09-20 DIAGNOSIS — Z5181 Encounter for therapeutic drug level monitoring: Secondary | ICD-10-CM

## 2010-09-20 DIAGNOSIS — I4891 Unspecified atrial fibrillation: Secondary | ICD-10-CM

## 2010-09-20 LAB — CONVERTED CEMR LAB: INR: 2.2

## 2010-09-24 ENCOUNTER — Ambulatory Visit: Payer: Medicare Other

## 2010-09-27 ENCOUNTER — Encounter: Payer: Self-pay | Admitting: Internal Medicine

## 2010-09-27 DIAGNOSIS — E119 Type 2 diabetes mellitus without complications: Secondary | ICD-10-CM | POA: Insufficient documentation

## 2010-09-27 DIAGNOSIS — M1990 Unspecified osteoarthritis, unspecified site: Secondary | ICD-10-CM | POA: Insufficient documentation

## 2010-09-27 DIAGNOSIS — I2510 Atherosclerotic heart disease of native coronary artery without angina pectoris: Secondary | ICD-10-CM

## 2010-09-27 DIAGNOSIS — I10 Essential (primary) hypertension: Secondary | ICD-10-CM | POA: Insufficient documentation

## 2010-09-27 DIAGNOSIS — E785 Hyperlipidemia, unspecified: Secondary | ICD-10-CM

## 2010-09-27 DIAGNOSIS — IMO0002 Reserved for concepts with insufficient information to code with codable children: Secondary | ICD-10-CM

## 2010-09-27 DIAGNOSIS — I509 Heart failure, unspecified: Secondary | ICD-10-CM | POA: Insufficient documentation

## 2010-09-27 DIAGNOSIS — F329 Major depressive disorder, single episode, unspecified: Secondary | ICD-10-CM | POA: Insufficient documentation

## 2010-09-27 DIAGNOSIS — K5730 Diverticulosis of large intestine without perforation or abscess without bleeding: Secondary | ICD-10-CM | POA: Insufficient documentation

## 2010-09-27 DIAGNOSIS — N400 Enlarged prostate without lower urinary tract symptoms: Secondary | ICD-10-CM | POA: Insufficient documentation

## 2010-09-27 DIAGNOSIS — D649 Anemia, unspecified: Secondary | ICD-10-CM | POA: Insufficient documentation

## 2010-09-27 DIAGNOSIS — J309 Allergic rhinitis, unspecified: Secondary | ICD-10-CM | POA: Insufficient documentation

## 2010-10-05 NOTE — Medication Information (Signed)
Summary: protime/tsc   PCP: Cindee Salt MD Indication 1: Atrial fibrillation PT 26.6 INR RANGE 2.0-3.5           Allergies: 1)  Lisinopril (Lisinopril)  Anticoagulation Management History:      Positive risk factors for bleeding include an age of 41 years or older and presence of serious comorbidities.  The bleeding index is 'intermediate risk'.  Positive CHADS2 values include History of CHF, History of HTN, Age > 75 years old, and History of Diabetes.  His last INR was 2.0 and today's INR is 2.2.  Prothrombin time is 26.6.    Anticoagulation Management Assessment/Plan:      The patient's current anticoagulation dose is Warfarin sodium 2.5 mg  tabs: 1 tablet by mouth daily, Warfarin sodium 5 mg tabs: Take one tablet one day a week..  The next INR is due 4 weeks.         ANTICOAGULATION RECORD PREVIOUS REGIMEN & LAB RESULTS Anticoagulation Diagnosis:  Atrial fibrillation on  06/09/2009 Previous INR Goal Range:  2.0-3.5 on  06/13/2008 Previous INR:  2.0 on  08/23/2010 Previous Coumadin Dose(mg):  2.5 mg daily, 5mg  mon on  08/23/2010 Previous Regimen:  2.5 mg daily, 5mg  mon on  08/23/2010 Previous Coagulation Comments:  . on  03/26/2010  NEW REGIMEN & LAB RESULTS Current INR: 2.2 Current Coumadin Dose(mg): 2.5 mg daily, 5mg  mon  Regimen: 2.5 mg daily, 5mg  mon   Provider: letvak Repeat testing in: 4 weeks Dose has been reviewed with patient or caretaker during this visit. Reviewed by: Celso Sickle  Anticoagulation Visit Questionnaire Coumadin dose missed/changed:  No Abnormal Bleeding Symptoms:  No  Any diet changes including alcohol intake, vegetables or greens since the last visit:  No Any illnesses or hospitalizations since the last visit:  No Any signs of clotting since the last visit (including chest discomfort, dizziness, shortness of breath, arm tingling, slurred speech, swelling or redness in leg):  No  MEDICATIONS LEVOTHYROXINE SODIUM 25 MCG TABS  (LEVOTHYROXINE SODIUM) take one by mouth daily METOPROLOL TARTRATE 25 MG TABS (METOPROLOL TARTRATE) Take 1/2  tablet by mouth twice a day GLIPIZIDE 10 MG TABS (GLIPIZIDE) 1 tablet two times a day by mouth WARFARIN SODIUM 2.5 MG  TABS (WARFARIN SODIUM) 1 tablet by mouth daily FUROSEMIDE 40 MG TABS (FUROSEMIDE) take 1-2 by mouth once daily as needed ISOSORBIDE MONONITRATE CR 30 MG XR24H-TAB (ISOSORBIDE MONONITRATE) take 1 by mouth once daily COZAAR 50 MG TABS (LOSARTAN POTASSIUM) take 1 by mouth once daily IPRATROPIUM BROMIDE 0.03 % SOLN (IPRATROPIUM BROMIDE) as needed SIMETHICONE 80 MG CHEW (SIMETHICONE) as needed FERREX 150 FORTE PLUS 50-100 MG CAPS (FE-SUCC AC-C-THRE AC-B12-FA) 1 tablet at bedtime by mouth METAMUCIL 30.9 % POWD (PSYLLIUM) 1 teaspoon at bedtime MYLANTA 200-200-20 MG/5ML SUSP (ALUM & MAG HYDROXIDE-SIMETH) as needed as needed BAYER BREEZE 2 TEST  DISK (GLUCOSE BLOOD) Test up to 4 times per day. ASPIRIN ADULT LOW STRENGTH 81 MG TBEC (ASPIRIN) take 1 tablet by mouth every other day MULTIVITAMINS  TABS (MULTIPLE VITAMIN) 1 past breakfast by mouth RANITIDINE HCL 75 MG TABS (RANITIDINE HCL) 2 tablets at bedtime OXYCODONE HCL 5 MG TABS (OXYCODONE HCL) 1/2- 1 tab by mouth three times a day as needed for pain WARFARIN SODIUM 5 MG TABS (WARFARIN SODIUM) Take one tablet one day a week.    Laboratory Results   Blood Tests     PT: 26.6 s   (Normal Range: 10.6-13.4)  INR: 2.2   (Normal Range: 0.88-1.12  Therap INR: 2.0-3.5)      ANTICOAGULATION RECORD PREVIOUS REGIMEN & LAB RESULTS Anticoagulation Diagnosis:  Atrial fibrillation on  06/09/2009 Previous INR Goal Range:  2.0-3.5 on  06/13/2008 Previous INR:  2.0 on  08/23/2010 Previous Coumadin Dose(mg):  2.5 mg daily, 5mg  mon on  08/23/2010 Previous Regimen:  2.5 mg daily, 5mg  mon on  08/23/2010 Previous Coagulation Comments:  . on  03/26/2010  NEW REGIMEN & LAB RESULTS Current INR: 2.2 Current Coumadin Dose(mg): 2.5 mg  daily, 5mg  mon  Regimen: 2.5 mg daily, 5mg  mon        Repeat testing in: 4 weeks MEDICATIONS LEVOTHYROXINE SODIUM 25 MCG TABS (LEVOTHYROXINE SODIUM) take one by mouth daily METOPROLOL TARTRATE 25 MG TABS (METOPROLOL TARTRATE) Take 1/2  tablet by mouth twice a day GLIPIZIDE 10 MG TABS (GLIPIZIDE) 1 tablet two times a day by mouth WARFARIN SODIUM 2.5 MG  TABS (WARFARIN SODIUM) 1 tablet by mouth daily FUROSEMIDE 40 MG TABS (FUROSEMIDE) take 1-2 by mouth once daily as needed ISOSORBIDE MONONITRATE CR 30 MG XR24H-TAB (ISOSORBIDE MONONITRATE) take 1 by mouth once daily COZAAR 50 MG TABS (LOSARTAN POTASSIUM) take 1 by mouth once daily IPRATROPIUM BROMIDE 0.03 % SOLN (IPRATROPIUM BROMIDE) as needed SIMETHICONE 80 MG CHEW (SIMETHICONE) as needed FERREX 150 FORTE PLUS 50-100 MG CAPS (FE-SUCC AC-C-THRE AC-B12-FA) 1 tablet at bedtime by mouth METAMUCIL 30.9 % POWD (PSYLLIUM) 1 teaspoon at bedtime MYLANTA 200-200-20 MG/5ML SUSP (ALUM & MAG HYDROXIDE-SIMETH) as needed as needed BAYER BREEZE 2 TEST  DISK (GLUCOSE BLOOD) Test up to 4 times per day. ASPIRIN ADULT LOW STRENGTH 81 MG TBEC (ASPIRIN) take 1 tablet by mouth every other day MULTIVITAMINS  TABS (MULTIPLE VITAMIN) 1 past breakfast by mouth RANITIDINE HCL 75 MG TABS (RANITIDINE HCL) 2 tablets at bedtime OXYCODONE HCL 5 MG TABS (OXYCODONE HCL) 1/2- 1 tab by mouth three times a day as needed for pain WARFARIN SODIUM 5 MG TABS (WARFARIN SODIUM) Take one tablet one day a week.   Anticoagulation Visit Questionnaire      Coumadin dose missed/changed:  No      Abnormal Bleeding Symptoms:  No

## 2010-10-10 ENCOUNTER — Other Ambulatory Visit: Payer: Self-pay | Admitting: Internal Medicine

## 2010-10-18 ENCOUNTER — Ambulatory Visit (INDEPENDENT_AMBULATORY_CARE_PROVIDER_SITE_OTHER): Payer: Medicare Other | Admitting: Internal Medicine

## 2010-10-18 ENCOUNTER — Ambulatory Visit: Payer: Medicare Other

## 2010-10-18 DIAGNOSIS — I4891 Unspecified atrial fibrillation: Secondary | ICD-10-CM

## 2010-10-18 DIAGNOSIS — Z5181 Encounter for therapeutic drug level monitoring: Secondary | ICD-10-CM

## 2010-10-18 DIAGNOSIS — Z7901 Long term (current) use of anticoagulants: Secondary | ICD-10-CM

## 2010-10-18 NOTE — Patient Instructions (Signed)
Continue current dose, check in 4 weeks  

## 2010-10-19 ENCOUNTER — Telehealth: Payer: Self-pay | Admitting: Radiology

## 2010-10-19 DIAGNOSIS — Z7901 Long term (current) use of anticoagulants: Secondary | ICD-10-CM

## 2010-10-19 DIAGNOSIS — I4891 Unspecified atrial fibrillation: Secondary | ICD-10-CM

## 2010-10-19 DIAGNOSIS — Z5181 Encounter for therapeutic drug level monitoring: Secondary | ICD-10-CM

## 2010-10-19 NOTE — Telephone Encounter (Signed)
INR standing order, please sign and close the encounter  

## 2010-10-20 NOTE — Telephone Encounter (Signed)
done

## 2010-10-26 ENCOUNTER — Other Ambulatory Visit: Payer: Self-pay | Admitting: *Deleted

## 2010-10-26 MED ORDER — IPRATROPIUM BROMIDE 0.03 % NA SOLN: 2.0000 | Freq: Two times a day (BID) | NASAL | Status: DC | PRN

## 2010-10-26 MED ORDER — WARFARIN SODIUM 2.5 MG PO TABS: 2.5000 mg | ORAL_TABLET | Freq: Every day | ORAL | Status: DC

## 2010-11-01 ENCOUNTER — Telehealth: Payer: Self-pay | Admitting: *Deleted

## 2010-11-01 ENCOUNTER — Telehealth: Payer: Self-pay | Admitting: Cardiovascular Disease

## 2010-11-01 NOTE — Telephone Encounter (Signed)
[facility], form is on your desk.

## 2010-11-01 NOTE — Telephone Encounter (Signed)
Pt has a place on his arm that will not stop bleeding or heal.  Could his coumadin dosage be too high?

## 2010-11-01 NOTE — Telephone Encounter (Signed)
Please call him tomorrow morning If area on arm is not better, offer appt to evaluate

## 2010-11-01 NOTE — Telephone Encounter (Signed)
Spoke to pt regarding place on arm not healing, he states that for about 1 week a cut on his elbow has continued to bleed. He has a bandage that he has to change once a day. Pt also has DM and states his sugars have been more elevated than normal recently. As stated below, pt's INR 10/18/10 was 2.6, and will f/u with [facility] coumadin clinic beginning of May. Pt denies any other bleeding from gums, urine, stool, bruising. Advised pt to monitor site and if he is having to change bandages more frequently, or new signs of bleeding occur to call immediately, otherwise, will notify Dr. Mariah Milling for any other recommendations. Message was sent to [facility], however, pt calling back today wanting to speak to someone about this right away.

## 2010-11-01 NOTE — Telephone Encounter (Signed)
Pt's Coumadin is monitored by [facility] office.  INR on 10/18/10 was 2.6 and it appears they advised pt to continue on same dosage and recheck in 4 weeks.  Coumadin questions will be deferred to this office as we are not monitoring or following pt's Coumadin. Will forward message to triage [facility]

## 2010-11-02 NOTE — Telephone Encounter (Signed)
Spoke with patient and he states that his arm is ok right now, it's now bleeding and he feels like it's better. Pt states he and his wife are putting the bandages on and he thinks it feels better. I advised pt of appt, he stated if it gets any worse he will call for an appt.

## 2010-11-02 NOTE — Telephone Encounter (Signed)
Spoke with patient and he states he checks his blood sugar once a day, [facility].

## 2010-11-02 NOTE — Telephone Encounter (Signed)
I have him as checking once a day and wouldn't have ordered more Please check on this (even if we ordered #100--this is how they come---it doesn't mean he is using that in 1 month)

## 2010-11-03 NOTE — Telephone Encounter (Signed)
Okay 

## 2010-11-15 ENCOUNTER — Ambulatory Visit (INDEPENDENT_AMBULATORY_CARE_PROVIDER_SITE_OTHER): Payer: Medicare Other | Admitting: Internal Medicine

## 2010-11-15 ENCOUNTER — Other Ambulatory Visit: Payer: Self-pay | Admitting: Internal Medicine

## 2010-11-15 DIAGNOSIS — Z7901 Long term (current) use of anticoagulants: Secondary | ICD-10-CM

## 2010-11-15 DIAGNOSIS — I4891 Unspecified atrial fibrillation: Secondary | ICD-10-CM

## 2010-11-15 DIAGNOSIS — Z5181 Encounter for therapeutic drug level monitoring: Secondary | ICD-10-CM

## 2010-11-15 LAB — POCT INR: INR: 3.2

## 2010-11-16 ENCOUNTER — Ambulatory Visit: Payer: Medicare Other

## 2010-11-24 ENCOUNTER — Encounter: Payer: Self-pay | Admitting: Internal Medicine

## 2010-11-24 ENCOUNTER — Ambulatory Visit (INDEPENDENT_AMBULATORY_CARE_PROVIDER_SITE_OTHER): Payer: Medicare Other | Admitting: Internal Medicine

## 2010-11-24 VITALS — BP 138/60 | HR 80 | Temp 97.7°F | Wt 154.0 lb

## 2010-11-24 DIAGNOSIS — F329 Major depressive disorder, single episode, unspecified: Secondary | ICD-10-CM

## 2010-11-24 DIAGNOSIS — E039 Hypothyroidism, unspecified: Secondary | ICD-10-CM

## 2010-11-24 DIAGNOSIS — M545 Low back pain, unspecified: Secondary | ICD-10-CM

## 2010-11-24 DIAGNOSIS — I4891 Unspecified atrial fibrillation: Secondary | ICD-10-CM

## 2010-11-24 DIAGNOSIS — F3289 Other specified depressive episodes: Secondary | ICD-10-CM

## 2010-11-24 DIAGNOSIS — I2510 Atherosclerotic heart disease of native coronary artery without angina pectoris: Secondary | ICD-10-CM

## 2010-11-24 DIAGNOSIS — I5041 Acute combined systolic (congestive) and diastolic (congestive) heart failure: Secondary | ICD-10-CM

## 2010-11-24 DIAGNOSIS — I4892 Unspecified atrial flutter: Secondary | ICD-10-CM

## 2010-11-24 DIAGNOSIS — N400 Enlarged prostate without lower urinary tract symptoms: Secondary | ICD-10-CM

## 2010-11-24 DIAGNOSIS — E119 Type 2 diabetes mellitus without complications: Secondary | ICD-10-CM

## 2010-11-24 DIAGNOSIS — I10 Essential (primary) hypertension: Secondary | ICD-10-CM

## 2010-11-24 LAB — BASIC METABOLIC PANEL
CO2: 30 mEq/L (ref 19–32)
Chloride: 95 mEq/L — ABNORMAL LOW (ref 96–112)
Potassium: 4.6 mEq/L (ref 3.5–5.1)
Sodium: 131 mEq/L — ABNORMAL LOW (ref 135–145)

## 2010-11-24 LAB — HEPATIC FUNCTION PANEL
AST: 43 U/L — ABNORMAL HIGH (ref 0–37)
Alkaline Phosphatase: 174 U/L — ABNORMAL HIGH (ref 39–117)
Bilirubin, Direct: 0.3 mg/dL (ref 0.0–0.3)
Total Bilirubin: 1 mg/dL (ref 0.3–1.2)

## 2010-11-24 LAB — T4, FREE: Free T4: 0.94 ng/dL (ref 0.60–1.60)

## 2010-11-24 LAB — CBC WITH DIFFERENTIAL/PLATELET
Basophils Absolute: 0 10*3/uL (ref 0.0–0.1)
HCT: 37.3 % — ABNORMAL LOW (ref 39.0–52.0)
Lymphs Abs: 1.4 10*3/uL (ref 0.7–4.0)
MCV: 102 fl — ABNORMAL HIGH (ref 78.0–100.0)
Monocytes Absolute: 0.5 10*3/uL (ref 0.1–1.0)
Neutro Abs: 3.8 10*3/uL (ref 1.4–7.7)
Platelets: 152 10*3/uL (ref 150.0–400.0)
RDW: 13.8 % (ref 11.5–14.6)

## 2010-11-24 LAB — HEMOGLOBIN A1C: Hgb A1c MFr Bld: 8.8 % — ABNORMAL HIGH (ref 4.6–6.5)

## 2010-11-24 MED ORDER — FUROSEMIDE 40 MG PO TABS: 40.0000 mg | ORAL_TABLET | Freq: Two times a day (BID) | ORAL | Status: DC

## 2010-11-24 NOTE — Progress Notes (Signed)
Subjective:    Patient ID: Dylan Perkins, male    DOB: [birth year], 77 y.o.   MRN: [id number]  HPI "A little older, stiffer and grumpier"  Had fall about a month ago and hit right arm against brick wall Took a while for bleeding to stop Still sore Able to use arm properly now though Ongoing shoulder and back pain Using lidoderm patch on shoulder---really helps Also using oxycodone. He is satisfied with this Occ "not totally with things" after the oxycodone. 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 Not necessarily exertional No true palpitations--he has trouble feeling his own pulse Some swelling in calves and feet---"stiff and hard" Occ doubles lasix if increased  Current outpatient prescriptions:alum & mag hydroxide-simeth (MYLANTA) 200-200-20 MG/5ML suspension, Take by mouth as needed.  , Disp: , Rfl: ;  aspirin 81 MG tablet, Take 81 mg by mouth every other day.  , Disp: , Rfl: ;  Fe-Succ Ac-C-Thre Ac-B12-FA (FERREX 150 FORTE PLUS) 50-100 MG CAPS, Take 1 tablet by mouth at bedtime.  , Disp: , Rfl: ;  furosemide (LASIX) 40 MG tablet, TAKE 1 TABLET BY MOUTH EVERYDAY, Disp: 30 tablet, Rfl: 11 glipiZIDE (GLUCOTROL) 10 MG tablet, TAKE 1 TABLET TWICE DAILY, Disp: 60 tablet, Rfl: 10;  Glucose Blood (BAYER BREEZE 2 TEST) DISK, by In Vitro route. Test up to 4 times a day , Disp: , Rfl: ;  ipratropium (ATROVENT) 0.03 % nasal spray, 2 sprays by Nasal route every 12 (twelve) hours as needed., Disp: 30 mL, Rfl: 1;  isosorbide mononitrate (IMDUR) 30 MG 24 hr tablet, Take 30 mg by mouth daily.  , Disp:  , Rfl:  levothyroxine (SYNTHROID, LEVOTHROID) 25 MCG tablet, TAKE 1 TABLET EVERY DAY, Disp: 90 tablet, Rfl: 3;  LIDODERM 5 %, , Disp: , Rfl: ;  losartan (COZAAR) 50 MG tablet, Take 50 mg by mouth daily.  , Disp: , Rfl: ;  metoprolol tartrate (LOPRESSOR) 25 MG tablet, Take 12.5 mg by mouth 2 (two) times daily.  , Disp: , Rfl: ;  Multiple Vitamin (MULTIVITAMIN) tablet, Take 1 tablet by mouth daily after breakfast.  , Disp: , Rfl:  oxyCODONE (ROXICODONE) 5 MG immediate release tablet, Take 2.5-5 mg by mouth every 8 (eight) hours as needed.  , Disp: , Rfl: ;  Psyllium 30.9 % POWD, Take by mouth at bedtime.  , Disp: , Rfl: ;  ranitidine (ZANTAC) 75 MG tablet, Take 75 mg by mouth at bedtime.  , Disp: , Rfl: ;  simethicone (MYLICON) 80 MG chewable tablet, Chew 80 mg by mouth as needed.  , Disp: , Rfl:  warfarin (COUMADIN) 2.5 MG tablet, Take 1 tablet (2.5 mg total) by mouth daily., Disp: 30 tablet, Rfl: 11;  warfarin (COUMADIN) 5 MG tablet, Take 5 mg by mouth once a week.  , Disp: , Rfl:   Past Medical History  Diagnosis Date  . Allergic rhinitis   . Anemia     NOS  . CAD (coronary artery disease)   . DM II (diabetes mellitus, type II), controlled   . HLD (hyperlipidemia)   . HTN (hypertension)   .  OA (osteoarthritis)   . BPH (benign prostatic hypertrophy)   . Diverticulosis of colon     with repeated bleeds  . Atrial fibrillation or flutter 11/09  . Depression   . CHF (congestive heart failure)     Past Surgical History  Procedure Date  . Tonsillectomy and adenoidectomy 1933  . Coronary artery bypass graft 1995  . Coronary artery bypass graft 1999    Redo  . Hemorrhoid surgery 1974  . Transurethral resection of prostate 1993  . Nasal stenosis repair 1996  . Aortic valve replacement 1999    pig valve  . Total shoulder replacement 1992    right  . Retinal vitrectomy     Left  . Cataract extraction 1999    Family History  Problem Relation Age of Onset  . Cancer Mother     (male)    . Parkinsonism Maternal Grandfather   . Mental retardation Son     History   Social History  . Marital Status: Married    Spouse Name: N/A    Number of Children: 5  . Years of Education: N/A   Occupational History  . 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 Smoking status: Former Smoker    Quit date: 07/18/1950  . Smokeless tobacco: Not on file  . Alcohol Use: Yes  . Drug Use: Not on file  . Sexually Active: Not on file   Other Topics Concern  . Not on file   Social History Narrative   Has living willWife has health care POA---then son, NickRequests DNR and order doneWould not want a feeding tube   Review of Systems Appetite is "too good" Weight is up a few pounds Sleep is still not great--relates to pain (even with oxycodone). Often sits up in chair Coughs if too low in supine position    Objective:   Physical Exam  Constitutional: He appears well-developed and well-nourished. No distress.  Neck: Normal range of motion. Neck supple. No thyromegaly present.  Cardiovascular: Normal rate.  Exam reveals no gallop.   Murmur heard.      Irregular with faint systolic murmur Pedal pulses not palpable but reasonable perfusion  Pulmonary/Chest: Effort normal and breath sounds normal. No respiratory distress. He has no wheezes. He has no rales.  Musculoskeletal: He exhibits edema. He exhibits no tenderness.       2+ tense edema in calves 1+ pitting edema in feet  Lymphadenopathy:    He has no cervical adenopathy.  Psychiatric: He has a normal mood and affect. His behavior is normal. Judgment and thought content normal.          Assessment & Plan:

## 2010-11-30 ENCOUNTER — Ambulatory Visit (INDEPENDENT_AMBULATORY_CARE_PROVIDER_SITE_OTHER): Payer: Medicare Other | Admitting: Internal Medicine

## 2010-11-30 DIAGNOSIS — Z7901 Long term (current) use of anticoagulants: Secondary | ICD-10-CM

## 2010-11-30 DIAGNOSIS — Z5181 Encounter for therapeutic drug level monitoring: Secondary | ICD-10-CM

## 2010-11-30 DIAGNOSIS — I4891 Unspecified atrial fibrillation: Secondary | ICD-10-CM

## 2010-12-02 ENCOUNTER — Encounter: Payer: Self-pay | Admitting: *Deleted

## 2010-12-22 ENCOUNTER — Ambulatory Visit (INDEPENDENT_AMBULATORY_CARE_PROVIDER_SITE_OTHER): Payer: Medicare Other | Admitting: Cardiovascular Disease

## 2010-12-22 ENCOUNTER — Encounter: Payer: Self-pay | Admitting: Cardiovascular Disease

## 2010-12-22 DIAGNOSIS — I10 Essential (primary) hypertension: Secondary | ICD-10-CM

## 2010-12-22 DIAGNOSIS — I5041 Acute combined systolic (congestive) and diastolic (congestive) heart failure: Secondary | ICD-10-CM

## 2010-12-22 DIAGNOSIS — E785 Hyperlipidemia, unspecified: Secondary | ICD-10-CM

## 2010-12-22 DIAGNOSIS — R0602 Shortness of breath: Secondary | ICD-10-CM | POA: Insufficient documentation

## 2010-12-22 DIAGNOSIS — E119 Type 2 diabetes mellitus without complications: Secondary | ICD-10-CM

## 2010-12-22 DIAGNOSIS — R609 Edema, unspecified: Secondary | ICD-10-CM

## 2010-12-22 DIAGNOSIS — I2510 Atherosclerotic heart disease of native coronary artery without angina pectoris: Secondary | ICD-10-CM

## 2010-12-22 DIAGNOSIS — I25810 Atherosclerosis of coronary artery bypass graft(s) without angina pectoris: Secondary | ICD-10-CM

## 2010-12-22 DIAGNOSIS — I4891 Unspecified atrial fibrillation: Secondary | ICD-10-CM

## 2010-12-22 NOTE — Progress Notes (Signed)
   Patient ID: Dylan Perkins, male    DOB: 1918/05/01, 77 y.o.   MRN: [id number]  HPI Comments: 77 yo WM with history of DM, HTN, hyperlipidemia, CAD s/p CABG and redo, s/p porcine AVR and  diagnosis of atrial fibrillation in November 2009 on coumadin therapy, At least mild aortic valve stenosis through the prosthetic valve on echocardiogram last year and at least moderate pulmonary hypertension seen,  who comes in today for cardiac follow up.  had a "clot to his right eye " that has caused significant vision loss on the right.   He has had continued lower extremity edema. He has only been taking Lasix once a day as he does not like to urinate all day. With this, he has had worsening shortness of breath and edema. Overall energy level has not been as good since he has been in atrial fibrillation.  He has had some pain in his legs with ambulation but arterial dopplers in may 2010 showed no obstructive lower extremity arterial disease.     He also has had poor sleep which has caused fatigue. He does go to exercise classes 3 times per week and uses the weight machines and the bike. He has had a recent cortisone shot in both ankles and has less pain. No significant chest pain, shortness of breath.    Echo in December of 2009 showed normal LV function with normally functioning AVR and moderate MR.   EKG shows atrial fibrillation with ventricular rate 60 beats per minute, intraventricular conduction delay, old anteroseptal infarct, left axis deviation        Review of Systems  Constitutional: Negative.   HENT: Negative.   Eyes: Negative.   Respiratory: Positive for shortness of breath.   Cardiovascular: Positive for leg swelling.  Gastrointestinal: Negative.   Musculoskeletal: Negative.   Skin: Negative.   Neurological: Negative.   Hematological: Negative.   Psychiatric/Behavioral: Positive for sleep disturbance.    BP 112/64  Pulse 60  Ht 5\' 8"  (1.727 m)  Wt 154 lb (69.854 kg)  BMI 23.42  kg/m2   Physical Exam  Nursing note and vitals reviewed. Constitutional: He is oriented to person, place, and time. He appears well-developed and well-nourished.  HENT:  Head: Normocephalic.  Nose: Nose normal.  Mouth/Throat: Oropharynx is clear and moist.  Eyes: Conjunctivae are normal. Pupils are equal, round, and reactive to light.  Neck: Normal range of motion. Neck supple. No JVD present.  Cardiovascular: Normal rate, regular rhythm, S1 normal, S2 normal, normal heart sounds and intact distal pulses.  Exam reveals no gallop and no friction rub.   No murmur heard.      Tight woody edema to below the knees bilaterally  Pulmonary/Chest: Effort normal. No respiratory distress. He has decreased breath sounds. He has no wheezes. He has no rales. He exhibits no tenderness.  Abdominal: Soft. Bowel sounds are normal. He exhibits no distension. There is no tenderness.  Musculoskeletal: Normal range of motion. He exhibits edema. He exhibits no tenderness.  Lymphadenopathy:    He has no cervical adenopathy.  Neurological: He is alert and oriented to person, place, and time. Coordination normal.  Skin: Skin is warm and dry. No rash noted. No erythema.  Psychiatric: He has a normal mood and affect. His behavior is normal. Judgment and thought content normal.           Assessment and Plan

## 2010-12-22 NOTE — Assessment & Plan Note (Signed)
Currently with no symptoms of angina. No further workup at this time. Continue current medication regimen. 

## 2010-12-22 NOTE — Assessment & Plan Note (Signed)
Continues in atrial fibrillation with good rate control. Continue him on his current medications.

## 2010-12-22 NOTE — Assessment & Plan Note (Signed)
His edema appears significant today. We have suggested he increase the Lasix to b.i.d.. The edema is likely secondary to diastolic dysfunction, underlying pulmonary hypertension.

## 2010-12-22 NOTE — Assessment & Plan Note (Signed)
Shortness of breath is from fluid overload, pulmonary hypertension. We have explained that his breathing will get better with improved diuresis with Lasix b.i.d..

## 2010-12-22 NOTE — Patient Instructions (Addendum)
Please increase the lasix to twice a day.  Decrease your fluid intake. Watch the salt intake.  Please call us if you have new issues that need to be addressed before your next appt.  We will call you for a follow up Appt. In 6 months

## 2010-12-22 NOTE — Assessment & Plan Note (Signed)
Blood pressure is well controlled on today's visit. No changes made to the medications. 

## 2010-12-28 ENCOUNTER — Ambulatory Visit (INDEPENDENT_AMBULATORY_CARE_PROVIDER_SITE_OTHER): Payer: Medicare Other | Admitting: Internal Medicine

## 2010-12-28 DIAGNOSIS — Z7901 Long term (current) use of anticoagulants: Secondary | ICD-10-CM

## 2010-12-28 DIAGNOSIS — I4891 Unspecified atrial fibrillation: Secondary | ICD-10-CM

## 2010-12-28 DIAGNOSIS — Z5181 Encounter for therapeutic drug level monitoring: Secondary | ICD-10-CM

## 2010-12-28 LAB — POCT INR: INR: 2.6

## 2011-01-07 ENCOUNTER — Encounter: Payer: Self-pay | Admitting: Cardiovascular Disease

## 2011-01-10 ENCOUNTER — Other Ambulatory Visit: Payer: Self-pay | Admitting: *Deleted

## 2011-01-10 MED ORDER — FERREX 150 FORTE PLUS 50-100 MG PO CAPS: 1.0000 | ORAL_CAPSULE | Freq: Every day | ORAL | Status: DC

## 2011-01-20 ENCOUNTER — Encounter: Payer: Self-pay | Admitting: Family Medicine

## 2011-01-20 ENCOUNTER — Ambulatory Visit (INDEPENDENT_AMBULATORY_CARE_PROVIDER_SITE_OTHER): Payer: Medicare Other | Admitting: Family Medicine

## 2011-01-20 VITALS — BP 120/68 | HR 70 | Temp 97.8°F | Ht 68.0 in | Wt 153.1 lb

## 2011-01-20 DIAGNOSIS — M79672 Pain in left foot: Secondary | ICD-10-CM

## 2011-01-20 DIAGNOSIS — M76829 Posterior tibial tendinitis, unspecified leg: Secondary | ICD-10-CM

## 2011-01-20 NOTE — Progress Notes (Signed)
Dylan Perkins, a 77 y.o. male presents today in the office for the following:   Very pleasant gentleman with multiple medical comorbidities, which were reviewed in full, 78 year old pleasant but active patient of Dr. Alphonsus Sias, and he is here for evaluation of left-sided medial foot and ankle pain. History is significant for prior CABG with vein harvesting going down adjacent to the medial malleolus of the LEFT ankle. He has had some chronic pain for a number years in and around the medial malleolus adjacent to the graft harvest site.  Now, however, his primary point of pain is inferior to the medial malleolus and going around the medial malleolus with some mild swelling and pain with push off on gait. He is wearing some excellent new balance walking shoes. He does do some regular exercise, and does do some exercise classes at [employer]. No recent occult injury.  He does have some occasional tingling, but mostly mild, and the pain described above around the medial malleolus.  Patient Active Problem List  Diagnoses  . UNSPECIFIED HYPOTHYROIDISM  . DIABETES MELLITUS, TYPE II  . HYPERLIPIDEMIA  . ANEMIA-NOS  . DEPRESSION  . HYPERTENSION  . CORONARY ARTERY DISEASE  . ATRIAL FIBRILLATION  . ATRIAL FLUTTER  . ACUTE COMBINED SYSTOLIC&DIASTOLIC HEART FAILURE  . ALLERGIC RHINITIS  . DIVERTICULOSIS, COLON  . BENIGN PROSTATIC HYPERTROPHY  . ACTINIC KERATOSIS  . CHRONIC ULCER OF UNSPECIFIED SITE  . OSTEOARTHRITIS  . BACK PAIN, LUMBAR  . EDEMA  . CAD, ARTERY BYPASS GRAFT  . SOB (shortness of breath)   Past Medical History  Diagnosis Date  . Allergic rhinitis   . Anemia     NOS  . CAD (coronary artery disease)   . DM II (diabetes mellitus, type II), controlled   . HLD (hyperlipidemia)   . HTN (hypertension)   . OA (osteoarthritis)   . BPH (benign prostatic hypertrophy)   . Diverticulosis of colon     with repeated bleeds  . Atrial fibrillation or flutter 11/09  . Depression   . CHF  (congestive heart failure)    Past Surgical History  Procedure Date  . Tonsillectomy and adenoidectomy 1933  . Coronary artery bypass graft 1995  . Coronary artery bypass graft 1999    Redo  . Hemorrhoid surgery 1974  . Transurethral resection of prostate 1993  . Nasal stenosis repair 1996  . Aortic valve replacement 1999    pig valve  . Total shoulder replacement 1992    right  . Retinal vitrectomy     Left  . Cataract extraction 1999   History  Substance Use Topics  . Smoking status: Former Smoker    Quit date: 07/18/1950  . Smokeless tobacco: Never Used  . Alcohol Use: Yes   Family History  Problem Relation Age of Onset  . Cancer Mother     (male)  . Parkinsonism Maternal Grandfather   . Mental retardation Son    Allergies  Allergen Reactions  . Lisinopril     REACTION: cough   Current Outpatient Prescriptions on File Prior to Visit  Medication Sig Dispense Refill  . alum & mag hydroxide-simeth (MYLANTA) 200-200-20 MG/5ML suspension Take by mouth as needed.        Marland Kitchen aspirin 81 MG tablet Take 81 mg by mouth every other day.        Marland Kitchen Fe-Succ Ac-C-Thre Ac-B12-FA (FERREX 150 FORTE PLUS) 50-100 MG CAPS Take 1 tablet by mouth at bedtime.  90 each  3  . furosemide (  LASIX) 40 MG tablet Take 1 tablet (40 mg total) by mouth 2 (two) times daily.  60 tablet  11  . glipiZIDE (GLUCOTROL) 10 MG tablet TAKE 1 TABLET TWICE DAILY  60 tablet  10  . Glucose Blood (BAYER BREEZE 2 TEST) DISK by In Vitro route. Test up to 4 times a day       . ipratropium (ATROVENT) 0.03 % nasal spray 2 sprays by Nasal route every 12 (twelve) hours as needed.  30 mL  1  . isosorbide mononitrate (IMDUR) 30 MG 24 hr tablet Take 30 mg by mouth daily.        Marland Kitchen levothyroxine (SYNTHROID, LEVOTHROID) 25 MCG tablet TAKE 1 TABLET EVERY DAY  90 tablet  3  . LIDODERM 5 %       . losartan (COZAAR) 50 MG tablet Take 50 mg by mouth daily.        . metoprolol tartrate (LOPRESSOR) 25 MG tablet Take 12.5 mg by mouth  2 (two) times daily.        . Multiple Vitamin (MULTIVITAMIN) tablet Take 1 tablet by mouth daily after breakfast.        . oxyCODONE (ROXICODONE) 5 MG immediate release tablet Take 2.5-5 mg by mouth every 8 (eight) hours as needed.        . Psyllium 30.9 % POWD Take by mouth at bedtime.        . ranitidine (ZANTAC) 75 MG tablet Take 75 mg by mouth at bedtime.        . simethicone (MYLICON) 80 MG chewable tablet Chew 80 mg by mouth as needed.        . warfarin (COUMADIN) 2.5 MG tablet Take 1 tablet (2.5 mg total) by mouth daily.  30 tablet  11  . warfarin (COUMADIN) 5 MG tablet Take 5 mg by mouth once a week.         REVIEW OF SYSTEMS  GEN: No fevers, chills. Nontoxic. Primarily MSK c/o today. MSK: Detailed in the HPI GI: tolerating PO intake without difficulty Neuro: detailed above Otherwise the pertinent positives of the ROS are noted above.    Physical Exam  Blood pressure 120/68, pulse 70, temperature 97.8 F (36.6 C), temperature source Oral, height 5\' 8"  (1.727 m), weight 153 lb 1.9 oz (69.455 kg), SpO2 96.00%.  GEN: WDWN, NAD, Non-toxic, A & O x 3 HEENT: Atraumatic, Normocephalic. Neck supple. No masses, No LAD. Ears and Nose: No external deformity. EXTR: No c/c/e NEURO Normal gait.  PSYCH: Normally interactive. Conversant. Not depressed or anxious appearing.  Calm demeanor.   Bilateral foot and ankle: No bruising. Range of motion is relatively preserved with some mild loss of motion bilaterally in plantar flexion and dorsiflexion. 1st MTP motion is relatively good for age with only minimal loss. Nontender along all phalanges in all metatarsals bilaterally. Nontender it navicular, cuboid, cuneiforms.  Nontender at the tail is in the true ankle joint. Nontender at the lateral malleolus.  LEFT: mild/moderate tenderness on the medial malleolus directly around the bone.  Mild degree of swelling on the medial aspect of the foot. Mostly inferior to the malleoli. Nontender with  palpation of a compression of the tibia and fibula.  Tenderness along the course of the posterior tibial flexor pollicis longus tendons mildly distally, and more moderately midportion and up to the medial aspect of the lower extremity.  Diagnostic Ultrasound Evaluation Terason t3000, MSK ultrasound, MSK probe Anatomy scanned:  Left foot Indication: Pain Findings: deltoid ligament intact.  Medial malleolus congruent. There is evidence of increased fluid in the tendon sheath of the posterior tibialis tendon. There is also increased activity with power flow Doppler in this area. No evidence of tear or horizontal split. Flexor hallicis longus appears intact. No ankle effusion.  Assessment and Plan: 1.  Posterior tibialis tenosynovitis: The patient has only mild foot breakdown for age. In the past, he has had 1-2 injections on the medial aspect of his ankle, unclear which exact anatomical structures per his history.  We discussed potential risks and benefits of injecting his posterior tibial tendon sheath, and the patient would very much like to proceed. I did discuss that there is a small risk of posterior tibialis tendon rupture, and he recognizes this, but would like to proceed secondary to quality of life and functionality at his age.  Ultrasound probe was used in this case to visualize the posterior tibialis tendon and marked its location for injection into the sheath  Posterior tibialis tendon sheath injection, L Verbal consent was obtained. Risks, benefits, and alternatives were discussed with the patient. Risks including lightening of the skin and potential rupture of the posterior tibialis tendon were discussed in detail. The patient would like to proceed. The patient was prepped with Betadine. A mixture of a 1/2rd of a cc of Kenalog 40 mg and 1/2 of a cc of lidocaine 1% was injected into the posterior tibialis she without any complication. A 22-gauge 1/2 inch needle was used. No bleeding, no  complications.

## 2011-01-25 ENCOUNTER — Emergency Department: Payer: PRIVATE HEALTH INSURANCE | Admitting: Emergency Medicine

## 2011-01-25 ENCOUNTER — Ambulatory Visit: Payer: Medicare Other

## 2011-01-26 ENCOUNTER — Ambulatory Visit: Payer: Self-pay | Admitting: Internal Medicine

## 2011-01-26 ENCOUNTER — Ambulatory Visit: Payer: Medicare Other | Admitting: Internal Medicine

## 2011-01-26 ENCOUNTER — Ambulatory Visit (INDEPENDENT_AMBULATORY_CARE_PROVIDER_SITE_OTHER): Payer: Medicare Other | Admitting: Internal Medicine

## 2011-01-26 ENCOUNTER — Encounter: Payer: Self-pay | Admitting: Internal Medicine

## 2011-01-26 DIAGNOSIS — I5041 Acute combined systolic (congestive) and diastolic (congestive) heart failure: Secondary | ICD-10-CM

## 2011-01-26 DIAGNOSIS — R609 Edema, unspecified: Secondary | ICD-10-CM

## 2011-01-26 DIAGNOSIS — Z7901 Long term (current) use of anticoagulants: Secondary | ICD-10-CM

## 2011-01-26 DIAGNOSIS — I4891 Unspecified atrial fibrillation: Secondary | ICD-10-CM

## 2011-01-26 NOTE — Patient Instructions (Signed)
Patient had his INR done at the emergency room, 7.10.12. INR was 2.3. Will call and schedule patient for 1 month, per Dr Alphonsus Sias

## 2011-01-26 NOTE — Patient Instructions (Signed)
Please take 80mg  of furosemide in AM and 40mg  at lunch if your morning weight is 145# or over. Take 40mg  twice a day when below that

## 2011-01-26 NOTE — Assessment & Plan Note (Signed)
Rate is still okay protime therapeutic yesterday in ER

## 2011-01-26 NOTE — Progress Notes (Signed)
Subjective:    Patient ID: Dylan Perkins, male    DOB: 10-18-1917, 77 y.o.   MRN: [id number]  HPI Chronic ankle problems He relates this back to his CABG Left ankle soreness started after vein harvest then and has been intermittent since then (1995)  Got injection from Dr Wille Glaser help but then recurred Bad pain 2 days ago so called 911 ER checked ultrasound No DVT but some venous obstruction in thigh  Notes more swelling when pain is worse  Has used support socks but so hard to get on he would be SOB by the time he gets them on  Swelling better today Got extra fluid meds in ER Told to increase to 80/40 for 3 days Given another pain pill  ER records reviewed Had very high BNP and CHF on CXR He weighs himself daily--146# today which is down 2# in 1 day  Current Outpatient Prescriptions on File Prior to Visit  Medication Sig Dispense Refill  . alum & mag hydroxide-simeth (MYLANTA) 200-200-20 MG/5ML suspension Take by mouth as needed.        Marland Kitchen aspirin 81 MG tablet Take 81 mg by mouth every other day.        Marland Kitchen Fe-Succ Ac-C-Thre Ac-B12-FA (FERREX 150 FORTE PLUS) 50-100 MG CAPS Take 1 tablet by mouth at bedtime.  90 each  3  . furosemide (LASIX) 40 MG tablet Take 1 tablet (40 mg total) by mouth 2 (two) times daily.  60 tablet  11  . glipiZIDE (GLUCOTROL) 10 MG tablet TAKE 1 TABLET TWICE DAILY  60 tablet  10  . Glucose Blood (BAYER BREEZE 2 TEST) DISK by In Vitro route. Test up to 4 times a day       . ipratropium (ATROVENT) 0.03 % nasal spray 2 sprays by Nasal route every 12 (twelve) hours as needed.  30 mL  1  . isosorbide mononitrate (IMDUR) 30 MG 24 hr tablet Take 30 mg by mouth daily.        Marland Kitchen levothyroxine (SYNTHROID, LEVOTHROID) 25 MCG tablet TAKE 1 TABLET EVERY DAY  90 tablet  3  . LIDODERM 5 %       . losartan (COZAAR) 50 MG tablet Take 50 mg by mouth daily.        . metoprolol tartrate (LOPRESSOR) 25 MG tablet Take 12.5 mg by mouth 2 (two) times daily.        .  Multiple Vitamin (MULTIVITAMIN) tablet Take 1 tablet by mouth daily after breakfast.        . oxyCODONE (ROXICODONE) 5 MG immediate release tablet Take 2.5-5 mg by mouth every 8 (eight) hours as needed.        . Psyllium 30.9 % POWD Take by mouth at bedtime.        . ranitidine (ZANTAC) 75 MG tablet Take 75 mg by mouth at bedtime.        . simethicone (MYLICON) 80 MG chewable tablet Chew 80 mg by mouth as needed.        . warfarin (COUMADIN) 2.5 MG tablet Take 1 tablet (2.5 mg total) by mouth daily.  30 tablet  11  . warfarin (COUMADIN) 5 MG tablet Take 5 mg by mouth once a week.          Allergies  Allergen Reactions  . Lisinopril     REACTION: cough    Past Medical History  Diagnosis Date  . Allergic rhinitis   . Anemia     NOS  .  CAD (coronary artery disease)   . DM II (diabetes mellitus, type II), controlled   . HLD (hyperlipidemia)   . HTN (hypertension)   . OA (osteoarthritis)   . BPH (benign prostatic hypertrophy)   . Diverticulosis of colon     with repeated bleeds  . Atrial fibrillation or flutter 11/09  . Depression   . CHF (congestive heart failure)     Past Surgical History  Procedure Date  . Tonsillectomy and adenoidectomy 1933  . Coronary artery bypass graft 1995  . Coronary artery bypass graft 1999    Redo  . Hemorrhoid surgery 1974  . Transurethral resection of prostate 1993  . Nasal stenosis repair 1996  . Aortic valve replacement 1999    pig valve  . Total shoulder replacement 1992    right  . Retinal vitrectomy     Left  . Cataract extraction 1999    Family History  Problem Relation Age of Onset  . Cancer Mother     (male)  . Parkinsonism Maternal Grandfather   . Mental retardation Son     History   Social History  . Marital Status: Married    Spouse Name: N/A    Number of Children: 5  . Years of Education: N/A   Occupational History  . 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 Smoking status:  Former Smoker    Quit date: 07/18/1950  . Smokeless tobacco: Never Used  . Alcohol Use: Yes  . Drug Use: No  . Sexually Active: Not on file   Other Topics Concern  . Not on file   Social History Narrative   Has living willWife has health care POA---then son, NickRequests DNR and order doneWould not want a feeding tube   Review of Systems No sig SOB--stable DOE Vague chest pain when he has the leg pain and spasms    Objective:   Physical Exam  Constitutional: He appears well-developed and well-nourished. No distress.  Neck: Normal range of motion. No JVD present.  Cardiovascular: Normal rate and normal heart sounds.  Exam reveals no gallop.        Irregular Pulses not palpable in feet but they are warm  Pulmonary/Chest: Effort normal and breath sounds normal. No respiratory distress. He has no wheezes. He has no rales.  Musculoskeletal:       Calves are tight but not edematous No tenderness  Lymphadenopathy:    He has no cervical adenopathy.  Psychiatric: He has a normal mood and affect. His behavior is normal. Judgment and thought content normal.          Assessment & Plan:

## 2011-01-26 NOTE — Assessment & Plan Note (Signed)
Will have him take more lasix when weight is over 145#---his dry weight

## 2011-01-26 NOTE — Assessment & Plan Note (Addendum)
Clearly seems to have venous insufficiency which is causing the pain Better with the increased lasix and fluid being down CHF also noted in ER and he feels better now He will try again to get support socks on regularly

## 2011-02-14 ENCOUNTER — Other Ambulatory Visit: Payer: Self-pay | Admitting: *Deleted

## 2011-02-14 MED ORDER — LOSARTAN POTASSIUM 50 MG PO TABS: 50.0000 mg | ORAL_TABLET | Freq: Every day | ORAL | Status: DC

## 2011-02-14 NOTE — Telephone Encounter (Signed)
Received faxed refill request from pharmacy. Rx sent in electronically. 

## 2011-02-23 ENCOUNTER — Ambulatory Visit (INDEPENDENT_AMBULATORY_CARE_PROVIDER_SITE_OTHER): Payer: Medicare Other | Admitting: Internal Medicine

## 2011-02-23 ENCOUNTER — Ambulatory Visit: Payer: Medicare Other

## 2011-02-23 ENCOUNTER — Telehealth: Payer: Self-pay | Admitting: Radiology

## 2011-02-23 DIAGNOSIS — Z7901 Long term (current) use of anticoagulants: Secondary | ICD-10-CM

## 2011-02-23 DIAGNOSIS — Z5181 Encounter for therapeutic drug level monitoring: Secondary | ICD-10-CM

## 2011-02-23 DIAGNOSIS — I4891 Unspecified atrial fibrillation: Secondary | ICD-10-CM

## 2011-02-23 NOTE — Patient Instructions (Addendum)
Continue current dose, check in 4 weeks, will eat more greens   To help control INR.

## 2011-02-23 NOTE — Telephone Encounter (Signed)
Please have him take the furosemide 40mg  daily unless his weight goes back over 145# at home Confirm what his measurements are at home

## 2011-02-23 NOTE — Telephone Encounter (Signed)
Patient was here today for INR, he said at  July appt with Dr Alphonsus Sias he was told to increase furosemide. He is down 10 lbs. Should he continue this?

## 2011-02-23 NOTE — Telephone Encounter (Signed)
Spoke with patient and advised results   

## 2011-02-25 ENCOUNTER — Telehealth: Payer: Self-pay | Admitting: *Deleted

## 2011-02-25 NOTE — Telephone Encounter (Signed)
Pt called regarding labs. I returned pt's call, no answer, LMOM TCB.

## 2011-02-28 ENCOUNTER — Telehealth: Payer: Self-pay | Admitting: *Deleted

## 2011-02-28 NOTE — Telephone Encounter (Signed)
Dylan Perkins is concerned about his PT reading last Wed which was 3.2.  He says they had increased his Coumadin and he wonders if maybe he should go back to his original dose.  I assured him that this reading would have come to your attention but perhaps he needs some reassurance.

## 2011-02-28 NOTE — Telephone Encounter (Signed)
I did review this and it is within the acceptable range We see some variability from time to time.  I try not to change it too much or it often would go out of range (like if I decreased his dose, I am afraid it will go below 2.0 the next time)

## 2011-03-01 NOTE — Telephone Encounter (Signed)
Spoke with patient and advised results   

## 2011-03-20 ENCOUNTER — Emergency Department: Payer: PRIVATE HEALTH INSURANCE | Admitting: Emergency Medicine

## 2011-03-24 ENCOUNTER — Ambulatory Visit (INDEPENDENT_AMBULATORY_CARE_PROVIDER_SITE_OTHER): Payer: Medicare Other | Admitting: Internal Medicine

## 2011-03-24 DIAGNOSIS — Z5181 Encounter for therapeutic drug level monitoring: Secondary | ICD-10-CM

## 2011-03-24 DIAGNOSIS — Z7901 Long term (current) use of anticoagulants: Secondary | ICD-10-CM

## 2011-03-24 DIAGNOSIS — I4891 Unspecified atrial fibrillation: Secondary | ICD-10-CM

## 2011-03-24 NOTE — Patient Instructions (Signed)
Continue  2.5 mg daily recheck 2 weeks 

## 2011-04-02 ENCOUNTER — Other Ambulatory Visit: Payer: Self-pay | Admitting: Internal Medicine

## 2011-04-07 ENCOUNTER — Ambulatory Visit: Payer: Medicare Other

## 2011-04-07 DIAGNOSIS — Z7901 Long term (current) use of anticoagulants: Secondary | ICD-10-CM

## 2011-04-07 DIAGNOSIS — Z5181 Encounter for therapeutic drug level monitoring: Secondary | ICD-10-CM

## 2011-04-07 DIAGNOSIS — I4891 Unspecified atrial fibrillation: Secondary | ICD-10-CM

## 2011-04-07 LAB — POCT INR
INR: 2.1
INR: 2.7

## 2011-04-16 ENCOUNTER — Other Ambulatory Visit: Payer: Self-pay | Admitting: Internal Medicine

## 2011-05-05 ENCOUNTER — Ambulatory Visit (INDEPENDENT_AMBULATORY_CARE_PROVIDER_SITE_OTHER): Payer: Medicare Other | Admitting: Internal Medicine

## 2011-05-05 DIAGNOSIS — Z5181 Encounter for therapeutic drug level monitoring: Secondary | ICD-10-CM

## 2011-05-05 DIAGNOSIS — I4891 Unspecified atrial fibrillation: Secondary | ICD-10-CM

## 2011-05-05 DIAGNOSIS — Z7901 Long term (current) use of anticoagulants: Secondary | ICD-10-CM

## 2011-05-05 NOTE — Patient Instructions (Signed)
Continue current dose, check in 4 weeks  

## 2011-05-08 ENCOUNTER — Other Ambulatory Visit: Payer: Self-pay | Admitting: Internal Medicine

## 2011-05-27 ENCOUNTER — Ambulatory Visit (INDEPENDENT_AMBULATORY_CARE_PROVIDER_SITE_OTHER): Payer: Medicare Other | Admitting: Internal Medicine

## 2011-05-27 ENCOUNTER — Encounter: Payer: Self-pay | Admitting: Internal Medicine

## 2011-05-27 VITALS — BP 138/58 | HR 65 | Temp 97.9°F | Ht 68.0 in | Wt 147.0 lb

## 2011-05-27 DIAGNOSIS — I4891 Unspecified atrial fibrillation: Secondary | ICD-10-CM

## 2011-05-27 DIAGNOSIS — I5042 Chronic combined systolic (congestive) and diastolic (congestive) heart failure: Secondary | ICD-10-CM

## 2011-05-27 DIAGNOSIS — D649 Anemia, unspecified: Secondary | ICD-10-CM

## 2011-05-27 DIAGNOSIS — M545 Low back pain: Secondary | ICD-10-CM

## 2011-05-27 DIAGNOSIS — Z5181 Encounter for therapeutic drug level monitoring: Secondary | ICD-10-CM

## 2011-05-27 DIAGNOSIS — Z7901 Long term (current) use of anticoagulants: Secondary | ICD-10-CM

## 2011-05-27 DIAGNOSIS — E119 Type 2 diabetes mellitus without complications: Secondary | ICD-10-CM

## 2011-05-27 LAB — CBC WITH DIFFERENTIAL/PLATELET
Basophils Relative: 0.4 % (ref 0.0–3.0)
Eosinophils Absolute: 0.1 10*3/uL (ref 0.0–0.7)
Eosinophils Relative: 2.5 % (ref 0.0–5.0)
HCT: 34.4 % — ABNORMAL LOW (ref 39.0–52.0)
Lymphs Abs: 1.2 10*3/uL (ref 0.7–4.0)
MCHC: 34.3 g/dL (ref 30.0–36.0)
MCV: 101.9 fl — ABNORMAL HIGH (ref 78.0–100.0)
Monocytes Absolute: 0.4 10*3/uL (ref 0.1–1.0)
RBC: 3.37 Mil/uL — ABNORMAL LOW (ref 4.22–5.81)
WBC: 4 10*3/uL — ABNORMAL LOW (ref 4.5–10.5)

## 2011-05-27 LAB — BASIC METABOLIC PANEL
BUN: 22 mg/dL (ref 6–23)
Calcium: 8.8 mg/dL (ref 8.4–10.5)
Creatinine, Ser: 1.2 mg/dL (ref 0.4–1.5)
GFR: 62.69 mL/min (ref >60.00)
Glucose, Bld: 216 mg/dL — ABNORMAL HIGH (ref 70–99)

## 2011-05-27 NOTE — Assessment & Plan Note (Signed)
Lab Results  Component Value Date   HGBA1C 8.8* 11/24/2010   Control not great but has low fasting levels Will check A1c again

## 2011-05-27 NOTE — Assessment & Plan Note (Signed)
Reasonable weight control On coumadin

## 2011-05-27 NOTE — Assessment & Plan Note (Signed)
Okay to change to less expensive iron supplement

## 2011-05-27 NOTE — Assessment & Plan Note (Signed)
Tries to limit narcotic use due to side effects May improve with continued exercise classes

## 2011-05-27 NOTE — Progress Notes (Signed)
Subjective:    Patient ID: Dylan Perkins, male    DOB: Jun 20, 1918, 77 y.o.   MRN: [id number]  HPI Has "a lot of issues" No problems with ankle pain or swelling with controlling weight Weights daily-- 142# to 148# He does adjust his furosemide some and watches his eating  Still with easy DOE with house or yard work Can [employer] for only a few minutes--then bushed for the day Does go to exercise class--but this is not aerobic Stamina may be down over the last few months Very limited chest pain---only if really pushes himself (goes away immediately with rest) No palpitations  Has trouble with vision through right eye Has had 8 injections but not much success (perhaps a little better)  Still with shoulder pain Tries to avoid the pain meds--affects his sleep, gets constipated  Tests sugar every morning 76-153. Most under 120 No hypoglycemia  Current Outpatient Prescriptions on File Prior to Visit  Medication Sig Dispense Refill  . alum & mag hydroxide-simeth (MYLANTA) 200-200-20 MG/5ML suspension Take by mouth as needed.        Marland Kitchen aspirin 81 MG tablet Take 81 mg by mouth every other day.        Marland Kitchen BAYER BREEZE 2 TEST DISK TEST UP TO 4 TIMES PER DAY  100 each  2  . Fe-Succ Ac-C-Thre Ac-B12-FA (FERREX 150 FORTE PLUS) 50-100 MG CAPS Take 1 tablet by mouth at bedtime.  90 each  3  . furosemide (LASIX) 40 MG tablet Take 40 mg by mouth 2 (two) times daily. Take 80mg  in the morning though if your weight is 145# or over       . glipiZIDE (GLUCOTROL) 10 MG tablet TAKE 1 TABLET TWICE DAILY  60 tablet  10  . ipratropium (ATROVENT) 0.03 % nasal spray 2 sprays by Nasal route every 12 (twelve) hours as needed.  30 mL  1  . isosorbide mononitrate (IMDUR) 30 MG 24 hr tablet TAKE 1 TABLET EVERY DAY  34 tablet  9  . levothyroxine (SYNTHROID, LEVOTHROID) 25 MCG tablet TAKE 1 TABLET EVERY DAY  90 tablet  3  . LIDODERM 5 %       . losartan (COZAAR) 50 MG tablet Take 1 tablet (50 mg total) by mouth  daily.  30 tablet  5  . metoprolol tartrate (LOPRESSOR) 25 MG tablet TAKE 1/2 TABLET BY MOUTH TWICE A DAY  30 tablet  11  . Multiple Vitamin (MULTIVITAMIN) tablet Take 1 tablet by mouth daily after breakfast.        . oxyCODONE (ROXICODONE) 5 MG immediate release tablet Take 2.5-5 mg by mouth every 8 (eight) hours as needed.        . Psyllium 30.9 % POWD Take by mouth at bedtime.        . ranitidine (ZANTAC) 75 MG tablet Take 75 mg by mouth at bedtime.        . simethicone (MYLICON) 80 MG chewable tablet Chew 80 mg by mouth as needed.        . warfarin (COUMADIN) 2.5 MG tablet Take 1 tablet (2.5 mg total) by mouth daily.  30 tablet  11  . warfarin (COUMADIN) 5 MG tablet Take 5 mg by mouth once a week.          Allergies  Allergen Reactions  . Lisinopril     REACTION: cough    Past Medical History  Diagnosis Date  . Allergic rhinitis   . Anemia  NOS  . CAD (coronary artery disease)   . DM II (diabetes mellitus, type II), controlled   . HLD (hyperlipidemia)   . HTN (hypertension)   . OA (osteoarthritis)   . BPH (benign prostatic hypertrophy)   . Diverticulosis of colon     with repeated bleeds  . Atrial fibrillation or flutter 11/09  . Depression   . CHF (congestive heart failure)     Past Surgical History  Procedure Date  . Tonsillectomy and adenoidectomy 1933  . Coronary artery bypass graft 1995  . Coronary artery bypass graft 1999    Redo  . Hemorrhoid surgery 1974  . Transurethral resection of prostate 1993  . Nasal stenosis repair 1996  . Aortic valve replacement 1999    pig valve  . Total shoulder replacement 1992    right  . Retinal vitrectomy     Left  . Cataract extraction 1999    Family History  Problem Relation Age of Onset  . Cancer Mother     (male)  . Parkinsonism Maternal Grandfather   . Mental retardation Son     History   Social History  . Marital Status: Married    Spouse Name: N/A    Number of Children: 5  . Years of Education:  N/A   Occupational History  . 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 Smoking status: Former Smoker    Quit date: 07/18/1950  . Smokeless tobacco: Never Used  . Alcohol Use: Yes  . Drug Use: No  . Sexually Active: Not on file   Other Topics Concern  . Not on file   Social History Narrative   Has living willWife has health care POA---then son, NickRequests DNR and order doneWould not want a feeding tube   Review of Systems Has had multiple lesions removed from skin---some cancers (last in August) occ bad dreams Appetite is good Gets cough when first lying supine in bed--then it eases    Objective:   Physical Exam  Constitutional: He appears well-developed and well-nourished. No distress.  Neck: Normal range of motion. Neck supple. No thyromegaly present.  Cardiovascular: Normal rate.  Exam reveals no gallop.   Murmur heard.      Irregular Gr 2/6 aortic systolic murmur  Pulmonary/Chest: Effort normal and breath sounds normal. No respiratory distress. He has no wheezes. He has no rales.  Abdominal: Soft. There is no tenderness.  Musculoskeletal: He exhibits no tenderness.       Calves tense but no sig edema  Lymphadenopathy:    He has no cervical adenopathy.  Psychiatric: He has a normal mood and affect. His behavior is normal. Judgment and thought content normal.          Assessment & Plan:

## 2011-05-27 NOTE — Patient Instructions (Signed)
Continue 2.5 mg daily recheck 4 weeks 

## 2011-05-27 NOTE — Assessment & Plan Note (Signed)
NYHA class 2 symptoms Discussed adding weight class to increase stamina Weight is controlled with diuretic

## 2011-06-01 ENCOUNTER — Encounter: Payer: Self-pay | Admitting: *Deleted

## 2011-06-21 ENCOUNTER — Encounter: Payer: Self-pay | Admitting: Cardiovascular Disease

## 2011-06-23 ENCOUNTER — Ambulatory Visit (INDEPENDENT_AMBULATORY_CARE_PROVIDER_SITE_OTHER): Payer: Medicare Other | Admitting: Internal Medicine

## 2011-06-23 DIAGNOSIS — Z5181 Encounter for therapeutic drug level monitoring: Secondary | ICD-10-CM

## 2011-06-23 DIAGNOSIS — I4891 Unspecified atrial fibrillation: Secondary | ICD-10-CM

## 2011-06-23 DIAGNOSIS — Z7901 Long term (current) use of anticoagulants: Secondary | ICD-10-CM

## 2011-06-23 NOTE — Patient Instructions (Signed)
Continue current dose, check in 4 weeks  

## 2011-06-28 ENCOUNTER — Ambulatory Visit (INDEPENDENT_AMBULATORY_CARE_PROVIDER_SITE_OTHER): Payer: Medicare Other | Admitting: Cardiovascular Disease

## 2011-06-28 ENCOUNTER — Encounter: Payer: Self-pay | Admitting: Cardiovascular Disease

## 2011-06-28 DIAGNOSIS — I25810 Atherosclerosis of coronary artery bypass graft(s) without angina pectoris: Secondary | ICD-10-CM

## 2011-06-28 DIAGNOSIS — I5042 Chronic combined systolic (congestive) and diastolic (congestive) heart failure: Secondary | ICD-10-CM

## 2011-06-28 DIAGNOSIS — R609 Edema, unspecified: Secondary | ICD-10-CM

## 2011-06-28 DIAGNOSIS — I10 Essential (primary) hypertension: Secondary | ICD-10-CM

## 2011-06-28 DIAGNOSIS — E785 Hyperlipidemia, unspecified: Secondary | ICD-10-CM

## 2011-06-28 DIAGNOSIS — I4891 Unspecified atrial fibrillation: Secondary | ICD-10-CM

## 2011-06-28 NOTE — Assessment & Plan Note (Signed)
Rate is well controlled on his current medication regimen.

## 2011-06-28 NOTE — Assessment & Plan Note (Signed)
Edema is likely multifactorial though from diastolic dysfunction. He has aVR with mild stenosis and regurgitation, atrial fibrillation, pulmonary hypertension. We have encouraged him to decrease his fluid intake.

## 2011-06-28 NOTE — Progress Notes (Signed)
Patient ID: Dylan Perkins, male    DOB: [birth year], 77 y.o.   MRN: [id number]  HPI Comments: 77 yo WM with history of DM, HTN, hyperlipidemia, CAD s/p CABG and redo, s/p porcine AVR and  diagnosis of atrial fibrillation in November 2009 on coumadin therapy, At least mild aortic valve stenosis through the prosthetic valve on echocardiogram last year and at least moderate pulmonary hypertension seen,  who comes in today for cardiac follow up.  had a "clot to his right eye " that has caused significant vision loss on the right.   He has had continued lower extremity edema. He has  been taking Lasix twice a day recently. Edema is worse after attending several parties for the holiday season this past weekend. He does have slightly worse shortness of breath. He has avoided diuretics in the past secondary to urination problems.   Overall energy level has not been as good since he has been in atrial fibrillation.  He has had some pain in his legs with ambulation but arterial dopplers in may 2010 showed no obstructive lower extremity arterial disease.     He also has had poor sleep which has caused fatigue. He does go to exercise classes 3 times per week and uses the weight machines and the bike. No significant chest pain    Echo in December of 2011 showed normal LV function with normally functioning AVR and moderate MR.   EKG shows atrial fibrillation with ventricular rate 68 beats per minute, intraventricular conduction delay, old anteroseptal infarct, left axis deviation      Outpatient Encounter Prescriptions as of 06/28/2011  Medication Sig Dispense Refill  . alum & mag hydroxide-simeth (MYLANTA) 200-200-20 MG/5ML suspension Take by mouth as needed.        Marland Kitchen aspirin 81 MG tablet Take 81 mg by mouth every other day.        Marland Kitchen BAYER BREEZE 2 TEST DISK TEST UP TO 4 TIMES PER DAY  100 each  2  . Fe-Succ Ac-C-Thre Ac-B12-FA (FERREX 150 FORTE PLUS) 50-100 MG CAPS Take 1 tablet by mouth at bedtime.  90  each  3  . furosemide (LASIX) 40 MG tablet Take 40 mg by mouth 2 (two) times daily. Take 80mg  in the morning though if your weight is 145# or over       . glipiZIDE (GLUCOTROL) 10 MG tablet TAKE 1 TABLET TWICE DAILY  60 tablet  10  . ipratropium (ATROVENT) 0.03 % nasal spray 2 sprays by Nasal route every 12 (twelve) hours as needed.  30 mL  1  . isosorbide mononitrate (IMDUR) 30 MG 24 hr tablet TAKE 1 TABLET EVERY DAY  34 tablet  9  . levothyroxine (SYNTHROID, LEVOTHROID) 25 MCG tablet TAKE 1 TABLET EVERY DAY  90 tablet  3  . LIDODERM 5 %       . losartan (COZAAR) 50 MG tablet Take 1 tablet (50 mg total) by mouth daily.  30 tablet  5  . metoprolol tartrate (LOPRESSOR) 25 MG tablet TAKE 1/2 TABLET BY MOUTH TWICE A DAY  30 tablet  11  . Multiple Vitamin (MULTIVITAMIN) tablet Take 1 tablet by mouth daily after breakfast.        . Psyllium 30.9 % POWD Take by mouth at bedtime.        . ranitidine (ZANTAC) 75 MG tablet Take 75 mg by mouth at bedtime.        . simethicone (MYLICON) 80 MG chewable tablet Chew 80  mg by mouth as needed.        . warfarin (COUMADIN) 2.5 MG tablet Take 1 tablet (2.5 mg total) by mouth daily.  30 tablet  11  . warfarin (COUMADIN) 5 MG tablet Take 5 mg by mouth once a week.        Marland Kitchen DISCONTD: oxyCODONE (ROXICODONE) 5 MG immediate release tablet Take 2.5-5 mg by mouth every 8 (eight) hours as needed.           Review of Systems  Constitutional: Negative.   HENT: Negative.   Eyes: Negative.   Respiratory: Positive for shortness of breath.   Cardiovascular: Positive for leg swelling.  Gastrointestinal: Negative.   Musculoskeletal: Negative.   Skin: Negative.   Neurological: Negative.   Hematological: Negative.   Psychiatric/Behavioral: Positive for sleep disturbance.  All other systems reviewed and are negative.    BP 132/68  Pulse 72  Ht 5\' 8"  (1.727 m)  Wt 151 lb 4 oz (68.607 kg)  BMI 23.00 kg/m2   Physical Exam  Nursing note and vitals  reviewed. Constitutional: He is oriented to person, place, and time. He appears well-developed and well-nourished.  HENT:  Head: Normocephalic.  Nose: Nose normal.  Mouth/Throat: Oropharynx is clear and moist.  Eyes: Conjunctivae are normal. Pupils are equal, round, and reactive to light.  Neck: Normal range of motion. Neck supple. No JVD present.  Cardiovascular: Normal rate, regular rhythm, S1 normal, S2 normal, normal heart sounds and intact distal pulses.  Exam reveals no gallop and no friction rub.   No murmur heard.      Tight woody edema to below the knees bilaterally, 1+  Pulmonary/Chest: Effort normal. No respiratory distress. He has decreased breath sounds. He has no wheezes. He has no rales. He exhibits no tenderness.  Abdominal: Soft. Bowel sounds are normal. He exhibits no distension. There is no tenderness.  Musculoskeletal: Normal range of motion. He exhibits edema. He exhibits no tenderness.  Lymphadenopathy:    He has no cervical adenopathy.  Neurological: He is alert and oriented to person, place, and time. Coordination normal.  Skin: Skin is warm and dry. No rash noted. No erythema.  Psychiatric: He has a normal mood and affect. His behavior is normal. Judgment and thought content normal.           Assessment and Plan

## 2011-06-28 NOTE — Assessment & Plan Note (Signed)
Currently with no symptoms of angina. No further workup at this time. Continue current medication regimen. 

## 2011-06-28 NOTE — Assessment & Plan Note (Signed)
Continued significant edema. He tends to avoid Lasix when possible. Currently he is taking Lasix b.i.d. After worsening edema following holiday parties.

## 2011-06-28 NOTE — Assessment & Plan Note (Addendum)
No recent lipids available. Goal LDL less than 70. Will defer to Dr. Alphonsus Sias.  He was at goal 2 years ago.

## 2011-06-28 NOTE — Patient Instructions (Signed)
You are doing well. No medication changes were made.  Please call us if you have new issues that need to be addressed before your next appt.  The office will contact you for a follow up Appt. In 6 months  

## 2011-07-17 ENCOUNTER — Other Ambulatory Visit: Payer: Self-pay | Admitting: Internal Medicine

## 2011-07-19 DIAGNOSIS — I272 Pulmonary hypertension, unspecified: Secondary | ICD-10-CM

## 2011-07-19 DIAGNOSIS — K922 Gastrointestinal hemorrhage, unspecified: Secondary | ICD-10-CM

## 2011-07-19 HISTORY — DX: Pulmonary hypertension, unspecified: I27.20

## 2011-07-19 HISTORY — DX: Gastrointestinal hemorrhage, unspecified: K92.2

## 2011-07-21 ENCOUNTER — Ambulatory Visit: Payer: Medicare Other

## 2011-07-21 ENCOUNTER — Ambulatory Visit (INDEPENDENT_AMBULATORY_CARE_PROVIDER_SITE_OTHER): Payer: Medicare Other | Admitting: Family Medicine

## 2011-07-21 DIAGNOSIS — I4891 Unspecified atrial fibrillation: Secondary | ICD-10-CM

## 2011-07-21 DIAGNOSIS — Z5181 Encounter for therapeutic drug level monitoring: Secondary | ICD-10-CM

## 2011-07-21 DIAGNOSIS — Z7901 Long term (current) use of anticoagulants: Secondary | ICD-10-CM

## 2011-07-21 LAB — POCT INR: INR: 2

## 2011-07-21 NOTE — Patient Instructions (Signed)
Continue 2.5 mg daily recheck 4 weeks

## 2011-07-22 ENCOUNTER — Ambulatory Visit: Payer: Medicare Other

## 2011-07-26 ENCOUNTER — Ambulatory Visit: Payer: Medicare Other | Admitting: Internal Medicine

## 2011-07-26 ENCOUNTER — Other Ambulatory Visit (INDEPENDENT_AMBULATORY_CARE_PROVIDER_SITE_OTHER): Payer: Medicare Other | Admitting: *Deleted

## 2011-07-26 DIAGNOSIS — I359 Nonrheumatic aortic valve disorder, unspecified: Secondary | ICD-10-CM

## 2011-07-26 DIAGNOSIS — I25810 Atherosclerosis of coronary artery bypass graft(s) without angina pectoris: Secondary | ICD-10-CM

## 2011-07-26 DIAGNOSIS — I4891 Unspecified atrial fibrillation: Secondary | ICD-10-CM

## 2011-07-26 DIAGNOSIS — I059 Rheumatic mitral valve disease, unspecified: Secondary | ICD-10-CM

## 2011-07-26 DIAGNOSIS — I10 Essential (primary) hypertension: Secondary | ICD-10-CM

## 2011-08-03 ENCOUNTER — Ambulatory Visit (INDEPENDENT_AMBULATORY_CARE_PROVIDER_SITE_OTHER): Payer: Medicare Other | Admitting: Cardiovascular Disease

## 2011-08-03 ENCOUNTER — Encounter: Payer: Self-pay | Admitting: Cardiovascular Disease

## 2011-08-03 VITALS — BP 102/64 | HR 72 | Ht 68.0 in | Wt 146.8 lb

## 2011-08-03 DIAGNOSIS — R609 Edema, unspecified: Secondary | ICD-10-CM

## 2011-08-03 DIAGNOSIS — I10 Essential (primary) hypertension: Secondary | ICD-10-CM

## 2011-08-03 DIAGNOSIS — I4891 Unspecified atrial fibrillation: Secondary | ICD-10-CM

## 2011-08-03 DIAGNOSIS — I2789 Other specified pulmonary heart diseases: Secondary | ICD-10-CM

## 2011-08-03 DIAGNOSIS — E785 Hyperlipidemia, unspecified: Secondary | ICD-10-CM

## 2011-08-03 DIAGNOSIS — I272 Pulmonary hypertension, unspecified: Secondary | ICD-10-CM | POA: Insufficient documentation

## 2011-08-03 DIAGNOSIS — I25810 Atherosclerosis of coronary artery bypass graft(s) without angina pectoris: Secondary | ICD-10-CM

## 2011-08-03 MED ORDER — LOSARTAN POTASSIUM 25 MG PO TABS: 25.0000 mg | ORAL_TABLET | Freq: Every day | ORAL | Status: DC

## 2011-08-03 NOTE — Assessment & Plan Note (Addendum)
Goal LDL <70. No recent lipid panel. Not on a statin. Will defer to Dr. Alphonsus Sias.

## 2011-08-03 NOTE — Assessment & Plan Note (Signed)
BP is low today with diuresis. We have suggested he decrease the losartan to 25 mg daily.

## 2011-08-03 NOTE — Patient Instructions (Addendum)
You are doing well. No medication changes were made.  We will check labs next Monday in our office If you weight drops more than 3 pounds, hold the lasix (Goal weight 136 to 137)  Cut the losartan in 1/2 to 25 mg daily  Please call us if you have new issues that need to be addressed before your next appt.  Your physician wants you to follow-up in: 1 months.  You will receive a reminder letter in the mail two months in advance. If you don't receive a letter, please call our office to schedule the follow-up appointment.

## 2011-08-03 NOTE — Assessment & Plan Note (Signed)
Echo showing severe pulmonary hypertension. 5 pound weight loss in one week on Lasix 80 mg b.i.d.. We will aim for an additional 3 pound weight loss. We'll check a basic metabolic panel in 5 days time.

## 2011-08-03 NOTE — Progress Notes (Signed)
Patient ID: Dylan Perkins, male    DOB: Jan 18, 1918, 78 y.o.   MRN: [id number]  HPI Comments: 78 yo WM with history of DM, HTN, hyperlipidemia, CAD s/p CABG and redo, s/p porcine AVR with residual stenosis, and  diagnosis of atrial fibrillation in November 2009 on coumadin therapy,   who comes in today for cardiac follow up. H/o clot to his right eye " that has caused significant vision loss on the right.  He had a recent echocardiogram for worsening edema.  Echocardiogram showed severe pulmonary hypertension. We instructed him to take Lasix 80 mg b.i.d.   Over the past week he has had 5 pound weight loss with improvement of his edema. He continues to have some edema and mild shortness of breath.   In the past, He has avoided diuretics in the past secondary to urination problems.    He has had some pain in his legs with ambulation but arterial dopplers in may 2010 showed no obstructive lower extremity arterial disease.     He also has had poor sleep which has caused fatigue. He does go to exercise classes 3 times per week and uses the weight machines and the bike. No significant chest pain    Echo in Jan 2012   Previous EKG shows atrial fibrillation with ventricular rate 68 beats per minute, intraventricular conduction delay, old anteroseptal infarct, left axis deviation      Outpatient Encounter Prescriptions as of 08/03/2011  Medication Sig Dispense Refill  . alum & mag hydroxide-simeth (MYLANTA) 200-200-20 MG/5ML suspension Take by mouth as needed.        Marland Kitchen aspirin 81 MG tablet Take 81 mg by mouth every other day.        Marland Kitchen BAYER BREEZE 2 TEST DISK TEST UP TO 4 TIMES PER DAY  100 each  2  . Fe-Succ Ac-C-Thre Ac-B12-FA (FERREX 150 FORTE PLUS) 50-100 MG CAPS Take 1 tablet by mouth at bedtime.  90 each  3  . furosemide (LASIX) 40 MG tablet Take 40 mg by mouth 2 (two) times daily. Take 80 mg in the am if your weight is 145 or over.      Marland Kitchen glipiZIDE (GLUCOTROL) 10 MG tablet TAKE 1 TABLET  TWICE DAILY  60 tablet  9  . ipratropium (ATROVENT) 0.03 % nasal spray 2 sprays by Nasal route every 12 (twelve) hours as needed.  30 mL  1  . isosorbide mononitrate (IMDUR) 30 MG 24 hr tablet TAKE 1 TABLET EVERY DAY  34 tablet  9  . levothyroxine (SYNTHROID, LEVOTHROID) 25 MCG tablet TAKE 1 TABLET EVERY DAY  90 tablet  3  . LIDODERM 5 %       . losartan (COZAAR) 50 MG tablet Take 1 tablet (50 mg total) by mouth daily.  30 tablet  5  . metoprolol tartrate (LOPRESSOR) 25 MG tablet TAKE 1/2 TABLET BY MOUTH TWICE A DAY  30 tablet  11  . Multiple Vitamin (MULTIVITAMIN) tablet Take 1 tablet by mouth daily after breakfast.        . Psyllium 30.9 % POWD Take by mouth at bedtime.        . ranitidine (ZANTAC) 75 MG tablet Take 75 mg by mouth at bedtime.        . simethicone (MYLICON) 80 MG chewable tablet Chew 80 mg by mouth as needed.        . warfarin (COUMADIN) 2.5 MG tablet Take 1 tablet (2.5 mg total) by mouth daily.  30  tablet  11  . warfarin (COUMADIN) 5 MG tablet Take 5 mg by mouth once a week.           Review of Systems  Constitutional: Negative.   HENT: Negative.   Eyes: Negative.   Respiratory: Positive for shortness of breath.   Cardiovascular: Positive for leg swelling.  Gastrointestinal: Negative.   Musculoskeletal: Negative.   Skin: Negative.   Neurological: Negative.   Hematological: Negative.   Psychiatric/Behavioral: Positive for sleep disturbance.  All other systems reviewed and are negative.    BP 102/64  Pulse 72  Ht 5\' 8"  (1.727 m)  Wt 146 lb 12 oz (66.565 kg)  BMI 22.31 kg/m2   Physical Exam  Nursing note and vitals reviewed. Constitutional: He is oriented to person, place, and time. He appears well-developed and well-nourished.  HENT:  Head: Normocephalic.  Nose: Nose normal.  Mouth/Throat: Oropharynx is clear and moist.  Eyes: Conjunctivae are normal. Pupils are equal, round, and reactive to light.  Neck: Normal range of motion. Neck supple. No JVD  present.  Cardiovascular: Normal rate, regular rhythm, S1 normal, S2 normal, normal heart sounds and intact distal pulses.  Exam reveals no gallop and no friction rub.   No murmur heard.       woody edema to below the knees bilaterally, Trace to 1 +  Pulmonary/Chest: Effort normal. No respiratory distress. He has decreased breath sounds. He has no wheezes. He has no rales. He exhibits no tenderness.  Abdominal: Soft. Bowel sounds are normal. He exhibits no distension. There is no tenderness.  Musculoskeletal: Normal range of motion. He exhibits edema. He exhibits no tenderness.  Lymphadenopathy:    He has no cervical adenopathy.  Neurological: He is alert and oriented to person, place, and time. Coordination normal.  Skin: Skin is warm and dry. No rash noted. No erythema.  Psychiatric: He has a normal mood and affect. His behavior is normal. Judgment and thought content normal.           Assessment and Plan

## 2011-08-03 NOTE — Assessment & Plan Note (Signed)
Rate controlled;  On warfarin 

## 2011-08-03 NOTE — Assessment & Plan Note (Signed)
Edema secondary to fluid overload and pulmonary HTN. Improving. Will continue diuresis. BMP early next week. Goal weight at home is 136 to 137 pounds. Currently 139 1/2 lbs

## 2011-08-03 NOTE — Assessment & Plan Note (Signed)
Currently with no symptoms of angina. No further workup at this time. Continue current medication regimen. 

## 2011-08-08 ENCOUNTER — Ambulatory Visit (INDEPENDENT_AMBULATORY_CARE_PROVIDER_SITE_OTHER): Payer: Medicare Other | Admitting: *Deleted

## 2011-08-08 DIAGNOSIS — I2789 Other specified pulmonary heart diseases: Secondary | ICD-10-CM

## 2011-08-08 DIAGNOSIS — I272 Pulmonary hypertension, unspecified: Secondary | ICD-10-CM

## 2011-08-09 LAB — BASIC METABOLIC PANEL
BUN: 29 mg/dL (ref 10–36)
CO2: 25 mmol/L (ref 20–32)
Calcium: 8.7 mg/dL (ref 8.6–10.2)
Chloride: 93 mmol/L — ABNORMAL LOW (ref 97–108)
GFR calc Af Amer: 51 mL/min/{1.73_m2} — ABNORMAL LOW (ref >59)
Glucose: 222 mg/dL — ABNORMAL HIGH (ref 65–99)

## 2011-08-12 ENCOUNTER — Other Ambulatory Visit: Payer: Self-pay | Admitting: Cardiovascular Disease

## 2011-08-12 MED ORDER — FUROSEMIDE 40 MG PO TABS: 40.0000 mg | ORAL_TABLET | Freq: Two times a day (BID) | ORAL | Status: DC

## 2011-08-12 NOTE — Telephone Encounter (Signed)
Refill sent to [facility] for [facility]urosemide.

## 2011-08-12 NOTE — Telephone Encounter (Signed)
Dylan Perkins called this morning and since Dr. Mariah Milling has increased the frequency of him taking the lasix he said he is running out and needs refill called into [facility].  Please call patient back and confirm that medication was called in.

## 2011-08-18 ENCOUNTER — Ambulatory Visit (INDEPENDENT_AMBULATORY_CARE_PROVIDER_SITE_OTHER): Payer: Medicare Other | Admitting: Internal Medicine

## 2011-08-18 DIAGNOSIS — Z7901 Long term (current) use of anticoagulants: Secondary | ICD-10-CM

## 2011-08-18 DIAGNOSIS — Z5181 Encounter for therapeutic drug level monitoring: Secondary | ICD-10-CM

## 2011-08-18 DIAGNOSIS — I4891 Unspecified atrial fibrillation: Secondary | ICD-10-CM

## 2011-08-18 LAB — POCT INR: INR: 2.8

## 2011-08-18 NOTE — Patient Instructions (Signed)
Continue current dose, check in 4 weeks  

## 2011-09-05 ENCOUNTER — Encounter: Payer: Self-pay | Admitting: Cardiovascular Disease

## 2011-09-05 ENCOUNTER — Ambulatory Visit (INDEPENDENT_AMBULATORY_CARE_PROVIDER_SITE_OTHER): Payer: Medicare Other | Admitting: Cardiovascular Disease

## 2011-09-05 DIAGNOSIS — I10 Essential (primary) hypertension: Secondary | ICD-10-CM

## 2011-09-05 DIAGNOSIS — I4891 Unspecified atrial fibrillation: Secondary | ICD-10-CM

## 2011-09-05 DIAGNOSIS — E785 Hyperlipidemia, unspecified: Secondary | ICD-10-CM

## 2011-09-05 DIAGNOSIS — I25810 Atherosclerosis of coronary artery bypass graft(s) without angina pectoris: Secondary | ICD-10-CM

## 2011-09-05 DIAGNOSIS — R609 Edema, unspecified: Secondary | ICD-10-CM

## 2011-09-05 DIAGNOSIS — I272 Pulmonary hypertension, unspecified: Secondary | ICD-10-CM

## 2011-09-05 DIAGNOSIS — I2789 Other specified pulmonary heart diseases: Secondary | ICD-10-CM

## 2011-09-05 NOTE — Patient Instructions (Signed)
You are doing well. No medication changes were made.  Please call us if you have new issues that need to be addressed before your next appt.  Your physician wants you to follow-up in: 3 months You will receive a reminder letter in the mail two months in advance. If you don't receive a letter, please call our office to schedule the follow-up appointment.   

## 2011-09-05 NOTE — Assessment & Plan Note (Signed)
Currently with no symptoms of angina. No further workup at this time. Continue current medication regimen. 

## 2011-09-05 NOTE — Assessment & Plan Note (Signed)
Edema is significantly improved after diuresis.

## 2011-09-05 NOTE — Assessment & Plan Note (Signed)
Significant diuresis over the past 6 weeks with approximately 15 pound weight loss. He continues to have a right pleural effusion on exam with some shortness of breath and coughing while lying flat. 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 If symptoms do not improve, we could consider a thoracentesis in the future for symptom relief. We have suggested he keep his weight around 140 pounds, on his scale at home.

## 2011-09-05 NOTE — Assessment & Plan Note (Signed)
He is not interested in a statin at this time.

## 2011-09-05 NOTE — Assessment & Plan Note (Signed)
Blood pressure is well controlled on today's visit. No changes made to the medications. 

## 2011-09-05 NOTE — Progress Notes (Signed)
Patient ID: Dylan Perkins, male    DOB: [birth year], 78 y.o.   MRN: [id number]  HPI Comments: 78 yo WM with history of DM, HTN, hyperlipidemia, CAD s/p CABG and redo, s/p porcine AVR with residual stenosis, and  diagnosis of atrial fibrillation in November 2009 on coumadin therapy,   who comes in today for cardiac follow up. He has intolerance of statins H/o clot to his right eye " that has caused significant vision loss on the right.  He had a recent echocardiogram for worsening edema on 07/28/2011 Echocardiogram showed severe pulmonary hypertension. He was started on high-dose Lasix b.i.d.. Echo also showing moderate aortic valve stenosis of his bioprosthetic valve, mildly dilated left atrium  Over the past 5 weeks, he has had a 15 pound weight loss with significant improvement in his breathing, lower extremity edema. Baseline weight is now 139 pounds. He does have mild shortness of breath, coughing if he lays flat. Nonproductive.  In the past, He has avoided diuretics in the past secondary to urination problems.  Arterial dopplers in may 2010 showed no obstructive lower extremity arterial disease.     Previous EKG shows atrial fibrillation with ventricular rate 68 beats per minute, intraventricular conduction delay, old anteroseptal infarct, left axis deviation      Outpatient Encounter Prescriptions as of 09/05/2011  Medication Sig Dispense Refill  . alum & mag hydroxide-simeth (MYLANTA) 200-200-20 MG/5ML suspension Take by mouth as needed.        Marland Kitchen aspirin 81 MG tablet Take 81 mg by mouth every other day.        Marland Kitchen BAYER BREEZE 2 TEST DISK TEST UP TO 4 TIMES PER DAY  100 each  2  . Fe-Succ Ac-C-Thre Ac-B12-FA (FERREX 150 FORTE PLUS) 50-100 MG CAPS Take 1 tablet by mouth at bedtime.  90 each  3  . furosemide (LASIX) 40 MG tablet Take 1 tablet (40 mg total) by mouth 2 (two) times daily. Take 80 mg in the am if your weight is 145 or over.  90 tablet  3  . glipiZIDE (GLUCOTROL) 10 MG tablet  TAKE 1 TABLET TWICE DAILY  60 tablet  9  . ipratropium (ATROVENT) 0.03 % nasal spray 2 sprays by Nasal route every 12 (twelve) hours as needed.  30 mL  1  . isosorbide mononitrate (IMDUR) 30 MG 24 hr tablet TAKE 1 TABLET EVERY DAY  34 tablet  9  . levothyroxine (SYNTHROID, LEVOTHROID) 25 MCG tablet TAKE 1 TABLET EVERY DAY  90 tablet  3  . LIDODERM 5 %       . losartan (COZAAR) 25 MG tablet Take 1 tablet (25 mg total) by mouth daily.  90 tablet  4  . metoprolol tartrate (LOPRESSOR) 25 MG tablet TAKE 1/2 TABLET BY MOUTH TWICE A DAY  30 tablet  11  . Multiple Vitamin (MULTIVITAMIN) tablet Take 1 tablet by mouth daily after breakfast.        . Psyllium 30.9 % POWD Take by mouth at bedtime.        . ranitidine (ZANTAC) 75 MG tablet Take 75 mg by mouth at bedtime.        . simethicone (MYLICON) 80 MG chewable tablet Chew 80 mg by mouth as needed.        . warfarin (COUMADIN) 2.5 MG tablet Take 1 tablet (2.5 mg total) by mouth daily.  30 tablet  11  . warfarin (COUMADIN) 5 MG tablet Take 5 mg by mouth once a week.  Review of Systems  Constitutional: Negative.   HENT: Negative.   Eyes: Negative.   Respiratory: Positive for cough and shortness of breath.   Gastrointestinal: Negative.   Musculoskeletal: Negative.   Skin: Negative.   Neurological: Negative.   Hematological: Negative.   Psychiatric/Behavioral: Positive for sleep disturbance.  All other systems reviewed and are negative.    BP 121/77  Pulse 65  Ht 5\' 8"  (1.727 m)  Wt 147 lb (66.679 kg)  BMI 22.35 kg/m2   Physical Exam  Nursing note and vitals reviewed. Constitutional: He is oriented to person, place, and time. He appears well-developed and well-nourished.  HENT:  Head: Normocephalic.  Nose: Nose normal.  Mouth/Throat: Oropharynx is clear and moist.  Eyes: Conjunctivae are normal. Pupils are equal, round, and reactive to light.  Neck: Normal range of motion. Neck supple. No JVD present.  Cardiovascular:  Normal rate, S1 normal, S2 normal and intact distal pulses.  An irregularly irregular rhythm present. Exam reveals no gallop and no friction rub.   Murmur heard.  Crescendo systolic murmur is present with a grade of 2/6       edema to below the knees bilaterally, Trace b/l  Pulmonary/Chest: Effort normal. No respiratory distress. He has decreased breath sounds in the right lower field. He has no wheezes. He has no rales. He exhibits no tenderness.  Abdominal: Soft. Bowel sounds are normal. He exhibits no distension. There is no tenderness.  Musculoskeletal: Normal range of motion. He exhibits edema. He exhibits no tenderness.  Lymphadenopathy:    He has no cervical adenopathy.  Neurological: He is alert and oriented to person, place, and time. Coordination normal.  Skin: Skin is warm and dry. No rash noted. No erythema.  Psychiatric: He has a normal mood and affect. His behavior is normal. Judgment and thought content normal.           Assessment and Plan

## 2011-09-05 NOTE — Assessment & Plan Note (Signed)
We did talk about his atrial fibrillation. We could attempt cardioversion though I suspect with moderate aortic valve stenosis and a mildly dilated left atrium, his rhythm may not hold. He has been in atrial fibrillation since 2009. I suggested that if he would like to improve his breathing, we should consider thoracentesis first.

## 2011-09-15 ENCOUNTER — Ambulatory Visit (INDEPENDENT_AMBULATORY_CARE_PROVIDER_SITE_OTHER): Payer: Medicare Other | Admitting: Internal Medicine

## 2011-09-15 DIAGNOSIS — Z7901 Long term (current) use of anticoagulants: Secondary | ICD-10-CM

## 2011-09-15 DIAGNOSIS — I4891 Unspecified atrial fibrillation: Secondary | ICD-10-CM

## 2011-09-15 DIAGNOSIS — Z5181 Encounter for therapeutic drug level monitoring: Secondary | ICD-10-CM

## 2011-09-15 LAB — POCT INR: INR: 2.3

## 2011-09-15 NOTE — Patient Instructions (Signed)
Continue current dose, check in 4 weeks  

## 2011-09-21 ENCOUNTER — Emergency Department: Payer: Self-pay | Admitting: Emergency Medicine

## 2011-09-21 ENCOUNTER — Telehealth: Payer: Self-pay | Admitting: Internal Medicine

## 2011-09-21 LAB — PROTIME-INR
INR: 1.8
Prothrombin Time: 21.3 secs — ABNORMAL HIGH (ref 11.5–14.7)

## 2011-09-21 NOTE — Telephone Encounter (Signed)
Tried getting hospital notes, and per medical records patient haven't been discharged yet and notes won't be available until after discharge.

## 2011-09-21 NOTE — Telephone Encounter (Signed)
Triage Record Num: [id number] Operator: Di Kindle Patient Name: Dylan Perkins Call Date & Time: 09/21/2011 10:10:26AM Patient Phone: [phone] PCP: Tillman Abide Patient Gender: Male PCP Fax : [phone] Patient DOB: 1918-06-04 Practice Name: [facility] Reason for Call: OFFICE PLEASE Caller: Hale/Patient is calling with a question about Coumadin.The medication was written by Tillman Abide I.. Pt calling following ED [facility] 09/21/11 for nosebleed, states nose is packed, has appt 09/23/11. With recent increase in bleeding, pt did not take dosage 2.5 mg daily of Coumadin 09/20/11, calling for recommendations for use of coumadin. Please call. OFFICE PLease Protocol(s) Used: Medication Questions - Adult Recommended Outcome per Protocol: Call Provider within 4 Hours Reason for Outcome: Recurrence of a symptom(s) or illness post prescribed medication treatment AND provider instructed patient to call if symptom(s) returned. Care Advice: ~ 09/21/2011 10:16:28AM Page 1 of 1 [email or web address]

## 2011-09-21 NOTE — Telephone Encounter (Signed)
Please get the ER records Okay to hold today  Will need to check what his INR was before giving further instructions If INR >2.5, would hold until his visit on Friday

## 2011-09-21 NOTE — Telephone Encounter (Signed)
Fax sent to [facility] for notes

## 2011-09-22 NOTE — Telephone Encounter (Signed)
I will review and see him tomorrow

## 2011-09-22 NOTE — Telephone Encounter (Signed)
Sorry he hadn't left the ED then, records on your desk

## 2011-09-22 NOTE — Telephone Encounter (Signed)
Will try to reach him there

## 2011-09-22 NOTE — Telephone Encounter (Signed)
I checked the hospitals and he is not an inpatient See if you can just get the protime result from the ER He has appt tomorrow

## 2011-09-23 ENCOUNTER — Encounter: Payer: Self-pay | Admitting: Internal Medicine

## 2011-09-23 ENCOUNTER — Ambulatory Visit (INDEPENDENT_AMBULATORY_CARE_PROVIDER_SITE_OTHER): Payer: Medicare Other | Admitting: Internal Medicine

## 2011-09-23 VITALS — BP 138/70 | HR 91 | Temp 98.0°F | Wt 147.0 lb

## 2011-09-23 DIAGNOSIS — I2789 Other specified pulmonary heart diseases: Secondary | ICD-10-CM

## 2011-09-23 DIAGNOSIS — E785 Hyperlipidemia, unspecified: Secondary | ICD-10-CM

## 2011-09-23 DIAGNOSIS — I272 Pulmonary hypertension, unspecified: Secondary | ICD-10-CM

## 2011-09-23 DIAGNOSIS — I10 Essential (primary) hypertension: Secondary | ICD-10-CM

## 2011-09-23 DIAGNOSIS — E119 Type 2 diabetes mellitus without complications: Secondary | ICD-10-CM

## 2011-09-23 DIAGNOSIS — I4891 Unspecified atrial fibrillation: Secondary | ICD-10-CM

## 2011-09-23 DIAGNOSIS — N400 Enlarged prostate without lower urinary tract symptoms: Secondary | ICD-10-CM

## 2011-09-23 LAB — LIPID PANEL
HDL: 45.9 mg/dL (ref >39.00)
LDL Cholesterol: 59 mg/dL (ref 0–99)
Total CHOL/HDL Ratio: 3
Triglycerides: 82 mg/dL (ref 0.0–149.0)

## 2011-09-23 NOTE — Assessment & Plan Note (Signed)
BP Readings from Last 3 Encounters:  09/23/11 138/70  09/05/11 121/77  08/03/11 102/64   Good control

## 2011-09-23 NOTE — Assessment & Plan Note (Signed)
Voiding okay 

## 2011-09-23 NOTE — Assessment & Plan Note (Signed)
Lab Results  Component Value Date   HGBA1C 7.8* 05/27/2011   Control off some in past week but seems to be okay in general Check A1c and lipid

## 2011-09-23 NOTE — Assessment & Plan Note (Signed)
Doing better with increased diuresis Normal LV function

## 2011-09-23 NOTE — Progress Notes (Signed)
Subjective:    Patient ID: Dylan Perkins, male    DOB: March 13, 1918, 78 y.o.   MRN: [id number]  HPI Seen in ER at [facility] 2 days ago for nosebleed Has balloon in place--going to ENT on Monday to remove and probably cauterize bleeding vessel [facility]) INR only 1.8---stopped the coumadin though Urged him to restart Put on augmentin to prevent infection  Recent echo with Dr Mariah Milling Severe pulm HTN with right sided dysfunction LV EF is normal though---no left side heart failure Ongoing DOE Leg swelling has improved  Sugars are high Checks once a day Stable ~140 still last week 98-234 in past week  Current Outpatient Prescriptions on File Prior to Visit  Medication Sig Dispense Refill  . alum & mag hydroxide-simeth (MYLANTA) 200-200-20 MG/5ML suspension Take by mouth as needed.        Marland Kitchen aspirin 81 MG tablet Take 81 mg by mouth every other day.        Marland Kitchen BAYER BREEZE 2 TEST DISK TEST UP TO 4 TIMES PER DAY  100 each  2  . Fe-Succ Ac-C-Thre Ac-B12-FA (FERREX 150 FORTE PLUS) 50-100 MG CAPS Take 1 tablet by mouth at bedtime.  90 each  3  . furosemide (LASIX) 40 MG tablet Take 1 tablet (40 mg total) by mouth 2 (two) times daily. Take 80 mg in the am if your weight is 145 or over.  90 tablet  3  . glipiZIDE (GLUCOTROL) 10 MG tablet TAKE 1 TABLET TWICE DAILY  60 tablet  9  . ipratropium (ATROVENT) 0.03 % nasal spray 2 sprays by Nasal route every 12 (twelve) hours as needed.  30 mL  1  . isosorbide mononitrate (IMDUR) 30 MG 24 hr tablet TAKE 1 TABLET EVERY DAY  34 tablet  9  . levothyroxine (SYNTHROID, LEVOTHROID) 25 MCG tablet TAKE 1 TABLET EVERY DAY  90 tablet  3  . LIDODERM 5 %       . losartan (COZAAR) 25 MG tablet Take 1 tablet (25 mg total) by mouth daily.  90 tablet  4  . metoprolol tartrate (LOPRESSOR) 25 MG tablet TAKE 1/2 TABLET BY MOUTH TWICE A DAY  30 tablet  11  . Multiple Vitamin (MULTIVITAMIN) tablet Take 1 tablet by mouth daily after breakfast.        . Psyllium 30.9 % POWD Take by  mouth at bedtime.        . ranitidine (ZANTAC) 75 MG tablet Take 75 mg by mouth at bedtime.        . simethicone (MYLICON) 80 MG chewable tablet Chew 80 mg by mouth as needed.        . warfarin (COUMADIN) 2.5 MG tablet Take 1 tablet (2.5 mg total) by mouth daily.  30 tablet  11  . warfarin (COUMADIN) 5 MG tablet Take 5 mg by mouth once a week.          Allergies  Allergen Reactions  . Lisinopril     REACTION: cough    Past Medical History  Diagnosis Date  . Allergic rhinitis   . Anemia     NOS  . CAD (coronary artery disease)   . DM II (diabetes mellitus, type II), controlled   . HLD (hyperlipidemia)   . HTN (hypertension)   . OA (osteoarthritis)   . BPH (benign prostatic hypertrophy)   . Diverticulosis of colon     with repeated bleeds  . Atrial fibrillation or flutter 11/09  . Depression   . CHF (congestive  heart failure)     Past Surgical History  Procedure Date  . Tonsillectomy and adenoidectomy 1933  . Coronary artery bypass graft 1995  . Coronary artery bypass graft 1999    Redo  . Hemorrhoid surgery 1974  . Transurethral resection of prostate 1993  . Nasal stenosis repair 1996  . Aortic valve replacement 1999    pig valve  . Total shoulder replacement 1992    right  . Retinal vitrectomy     Left  . Cataract extraction 1999    Family History  Problem Relation Age of Onset  . Cancer Mother     (male)  . Parkinsonism Maternal Grandfather   . Mental retardation Son     History   Social History  . Marital Status: Married    Spouse Name: N/A    Number of Children: 5  . Years of Education: N/A   Occupational History  . 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 Smoking status: Former Smoker    Quit date: 07/18/1950  . Smokeless tobacco: Never Used  . Alcohol Use: Yes  . Drug Use: No  . Sexually Active: Not on file   Other Topics Concern  . Not on file   Social History Narrative   Has living willWife has  health care POA---then son, NickRequests DNR and order doneWould not want a feeding tube   Review of Systems Sleep is variable--up in night regularly. Does nap in day Appetite is fine Weight is stable    Objective:   Physical Exam  Constitutional: He appears well-developed. No distress.  Neck: Normal range of motion. Neck supple.  Cardiovascular: Normal rate and normal heart sounds.  Exam reveals no gallop.   No murmur heard.      Irregular Faint pulse on left, absent on right  Pulmonary/Chest: Effort normal and breath sounds normal. No respiratory distress. He has no wheezes. He has no rales.  Musculoskeletal: He exhibits edema. He exhibits no tenderness.       1+ tense edema  Lymphadenopathy:    He has no cervical adenopathy.  Skin:       Dry eschar on small lesion right calf No foot lesions  Psychiatric: He has a normal mood and affect. His behavior is normal.          Assessment & Plan:

## 2011-09-23 NOTE — Progress Notes (Signed)
Addended by: Alvina Chou on: 09/23/2011 10:21 AM   Modules accepted: Orders

## 2011-09-23 NOTE — Assessment & Plan Note (Signed)
Good rate control Asked him to resume coumadin

## 2011-09-30 ENCOUNTER — Ambulatory Visit (INDEPENDENT_AMBULATORY_CARE_PROVIDER_SITE_OTHER): Payer: Medicare Other | Admitting: Family Medicine

## 2011-09-30 DIAGNOSIS — Z7901 Long term (current) use of anticoagulants: Secondary | ICD-10-CM

## 2011-09-30 DIAGNOSIS — Z5181 Encounter for therapeutic drug level monitoring: Secondary | ICD-10-CM

## 2011-09-30 DIAGNOSIS — I4891 Unspecified atrial fibrillation: Secondary | ICD-10-CM

## 2011-09-30 NOTE — Patient Instructions (Signed)
Continue 2.5 mg daily(has only been back on Coumadin x 1 week) take a 5mg  today then resume 2.5mg  daily.recheck in 1 week

## 2011-10-07 ENCOUNTER — Ambulatory Visit (INDEPENDENT_AMBULATORY_CARE_PROVIDER_SITE_OTHER): Payer: Medicare Other | Admitting: Internal Medicine

## 2011-10-07 DIAGNOSIS — Z5181 Encounter for therapeutic drug level monitoring: Secondary | ICD-10-CM

## 2011-10-07 DIAGNOSIS — I4891 Unspecified atrial fibrillation: Secondary | ICD-10-CM

## 2011-10-07 DIAGNOSIS — Z7901 Long term (current) use of anticoagulants: Secondary | ICD-10-CM

## 2011-10-07 NOTE — Patient Instructions (Signed)
5 mg next 3 days then check on Monday

## 2011-10-10 ENCOUNTER — Ambulatory Visit (INDEPENDENT_AMBULATORY_CARE_PROVIDER_SITE_OTHER): Payer: Medicare Other | Admitting: Internal Medicine

## 2011-10-10 DIAGNOSIS — I4891 Unspecified atrial fibrillation: Secondary | ICD-10-CM

## 2011-10-10 NOTE — Patient Instructions (Signed)
2 mg daily, 2 weeks

## 2011-10-13 ENCOUNTER — Ambulatory Visit: Payer: Medicare Other

## 2011-10-24 ENCOUNTER — Ambulatory Visit (INDEPENDENT_AMBULATORY_CARE_PROVIDER_SITE_OTHER): Payer: Medicare Other | Admitting: Internal Medicine

## 2011-10-24 ENCOUNTER — Encounter: Payer: Self-pay | Admitting: Internal Medicine

## 2011-10-24 VITALS — BP 120/60 | HR 76 | Temp 98.1°F | Wt 146.0 lb

## 2011-10-24 DIAGNOSIS — Z7901 Long term (current) use of anticoagulants: Secondary | ICD-10-CM

## 2011-10-24 DIAGNOSIS — I4891 Unspecified atrial fibrillation: Secondary | ICD-10-CM

## 2011-10-24 DIAGNOSIS — Z5181 Encounter for therapeutic drug level monitoring: Secondary | ICD-10-CM

## 2011-10-24 DIAGNOSIS — R631 Polydipsia: Secondary | ICD-10-CM

## 2011-10-24 LAB — POCT URINALYSIS DIPSTICK
Glucose, UA: NEGATIVE
Ketones, UA: NEGATIVE
Leukocytes, UA: NEGATIVE
Spec Grav, UA: 1.01
Urobilinogen, UA: NEGATIVE

## 2011-10-24 LAB — POCT INR: INR: 2

## 2011-10-24 NOTE — Patient Instructions (Signed)
Continue current dose, check in 4 weeks  

## 2011-10-24 NOTE — Assessment & Plan Note (Addendum)
Doesn't seem to be related to the diabetes---no glycosuria SG 1.010 so could be related to concentration but not if sodium normal Will just observe

## 2011-10-24 NOTE — Progress Notes (Signed)
Subjective:    Patient ID: Dylan Perkins, male    DOB: 04/29/1918, 78 y.o.   MRN: [id number]  HPI Having trouble with his sugars Notes that he is thirsty all the time---worried that it is from his sugars Checks every morning Runs 114-179  Only rare over 200  Mouth chronically dry Takes lasix 80 qAM and occ needs repeat dose at lunch if weight up Thirst is quenched by wate  Current Outpatient Prescriptions on File Prior to Visit  Medication Sig Dispense Refill  . alum & mag hydroxide-simeth (MYLANTA) 200-200-20 MG/5ML suspension Take by mouth as needed.        Marland Kitchen aspirin 81 MG tablet Take 81 mg by mouth every other day.        Marland Kitchen BAYER BREEZE 2 TEST DISK TEST UP TO 4 TIMES PER DAY  100 each  2  . Fe-Succ Ac-C-Thre Ac-B12-FA (FERREX 150 FORTE PLUS) 50-100 MG CAPS Take 1 tablet by mouth at bedtime.  90 each  3  . furosemide (LASIX) 40 MG tablet Take 1 tablet (40 mg total) by mouth 2 (two) times daily. Take 80 mg in the am if your weight is 145 or over.  90 tablet  3  . glipiZIDE (GLUCOTROL) 10 MG tablet TAKE 1 TABLET TWICE DAILY  60 tablet  9  . ipratropium (ATROVENT) 0.03 % nasal spray 2 sprays by Nasal route every 12 (twelve) hours as needed.  30 mL  1  . isosorbide mononitrate (IMDUR) 30 MG 24 hr tablet TAKE 1 TABLET EVERY DAY  34 tablet  9  . levothyroxine (SYNTHROID, LEVOTHROID) 25 MCG tablet TAKE 1 TABLET EVERY DAY  90 tablet  3  . LIDODERM 5 %       . losartan (COZAAR) 25 MG tablet Take 1 tablet (25 mg total) by mouth daily.  90 tablet  4  . metoprolol tartrate (LOPRESSOR) 25 MG tablet TAKE 1/2 TABLET BY MOUTH TWICE A DAY  30 tablet  11  . Multiple Vitamin (MULTIVITAMIN) tablet Take 1 tablet by mouth daily after breakfast.        . Psyllium 30.9 % POWD Take by mouth at bedtime.        . ranitidine (ZANTAC) 75 MG tablet Take 75 mg by mouth at bedtime.        . simethicone (MYLICON) 80 MG chewable tablet Chew 80 mg by mouth as needed.        . warfarin (COUMADIN) 2.5 MG tablet Take 1  tablet (2.5 mg total) by mouth daily.  30 tablet  11  . warfarin (COUMADIN) 5 MG tablet Take 5 mg by mouth once a week.          Allergies  Allergen Reactions  . Lisinopril     REACTION: cough    Past Medical History  Diagnosis Date  . Allergic rhinitis   . Anemia     NOS  . CAD (coronary artery disease)   . DM II (diabetes mellitus, type II), controlled   . HLD (hyperlipidemia)   . HTN (hypertension)   . OA (osteoarthritis)   . BPH (benign prostatic hypertrophy)   . Diverticulosis of colon     with repeated bleeds  . Atrial fibrillation or flutter 11/09  . Depression   . CHF (congestive heart failure)     Past Surgical History  Procedure Date  . Tonsillectomy and adenoidectomy 1933  . Coronary artery bypass graft 1995  . Coronary artery bypass graft 1999  Redo  . Hemorrhoid surgery 1974  . Transurethral resection of prostate 1993  . Nasal stenosis repair 1996  . Aortic valve replacement 1999    pig valve  . Total shoulder replacement 1992    right  . Retinal vitrectomy     Left  . Cataract extraction 1999    Family History  Problem Relation Age of Onset  . Cancer Mother     (male)  . Parkinsonism Maternal Grandfather   . Mental retardation Son     History   Social History  . Marital Status: Married    Spouse Name: N/A    Number of Children: 5  . Years of Education: N/A   Occupational History  . 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 Smoking status: Former Smoker    Quit date: 07/18/1950  . Smokeless tobacco: Never Used  . Alcohol Use: Yes  . Drug Use: No  . Sexually Active: Not on file   Other Topics Concern  . Not on file   Social History Narrative   Has living willWife has health care POA---then son, NickRequests DNR and order doneWould not want a feeding tube   Review of Systems Appetite is okay Tries to be careful with eating     Objective:   Physical Exam  Constitutional: He appears  well-developed and well-nourished. No distress.  HENT:  Mouth/Throat: Oropharynx is clear and moist. No oropharyngeal exudate.       No oral lesions  Musculoskeletal: He exhibits edema.       1+ tense edema  Skin:       Calf ulcer with eschar on right anterior          Assessment & Plan:

## 2011-10-27 ENCOUNTER — Telehealth: Payer: Self-pay | Admitting: Internal Medicine

## 2011-10-27 DIAGNOSIS — M545 Low back pain: Secondary | ICD-10-CM

## 2011-10-27 NOTE — Telephone Encounter (Signed)
Need therapy on back, legs, shoulder, all over, per patient he has arthritis

## 2011-10-27 NOTE — Telephone Encounter (Signed)
What is his direct concern for PT to address? (like balance, stamina, new weakness.Marland KitchenMarland KitchenMarland Kitchen)

## 2011-10-27 NOTE — Telephone Encounter (Signed)
Pt is wanting an order for Physical Therapy over at [facility] ??

## 2011-11-03 ENCOUNTER — Telehealth: Payer: Self-pay

## 2011-11-03 NOTE — Telephone Encounter (Signed)
Pt seen 10/24/11. FBS between 150-180 for several weeks. On 10/31/11 FBS 155;  11/01/11 FBS 84;  11/02/11 FBS 81; 11/03/11 FBS 77. Pt feels unusual, woozy and SOB on and off for long time; no chest pain, no h/a, no fever and no sweats. Pt taking Glipizide 10 mg twice daily. No changes in diet. Dr Para March suggest pt eat snack and then wait for Dr Karle Starch instructions. Pt to call back if condition changes or worsens. Pt uses [facility] if pharmacy needed and pt can be reached at [phone].

## 2011-11-03 NOTE — Telephone Encounter (Signed)
Spoke with patient and advised results, pt will call if anything changes. 

## 2011-11-03 NOTE — Telephone Encounter (Signed)
These blood sugars are okay I am concerned about any symptoms though If he continues to not feel right when meal is due and sugar under 100, I will have to cut the glipizide dose

## 2011-11-07 ENCOUNTER — Other Ambulatory Visit: Payer: Self-pay | Admitting: Internal Medicine

## 2011-11-21 ENCOUNTER — Telehealth: Payer: Self-pay

## 2011-11-21 ENCOUNTER — Ambulatory Visit (INDEPENDENT_AMBULATORY_CARE_PROVIDER_SITE_OTHER): Payer: Medicare Other | Admitting: Internal Medicine

## 2011-11-21 DIAGNOSIS — I4891 Unspecified atrial fibrillation: Secondary | ICD-10-CM

## 2011-11-21 DIAGNOSIS — Z7901 Long term (current) use of anticoagulants: Secondary | ICD-10-CM

## 2011-11-21 DIAGNOSIS — Z5181 Encounter for therapeutic drug level monitoring: Secondary | ICD-10-CM

## 2011-11-21 LAB — POCT INR: INR: 2.7

## 2011-11-21 NOTE — Telephone Encounter (Signed)
Form signed.

## 2011-11-21 NOTE — Patient Instructions (Signed)
Continue 2.5 mg daily, recheck 4 weeks 

## 2011-11-21 NOTE — Telephone Encounter (Signed)
Patient notified as instructed by telephone form is at front desk for pick up.

## 2011-11-21 NOTE — Telephone Encounter (Signed)
Permanent disability parking placard form left for completion. Form is in Dr Karle Starch in box.Please call [phone] when form ready for pick up.

## 2011-11-25 ENCOUNTER — Telehealth: Payer: Self-pay | Admitting: Internal Medicine

## 2011-11-25 ENCOUNTER — Encounter: Payer: Self-pay | Admitting: Internal Medicine

## 2011-11-25 ENCOUNTER — Ambulatory Visit (INDEPENDENT_AMBULATORY_CARE_PROVIDER_SITE_OTHER): Payer: Medicare Other | Admitting: Internal Medicine

## 2011-11-25 VITALS — BP 120/80 | HR 58 | Temp 97.9°F | Wt 144.0 lb

## 2011-11-25 DIAGNOSIS — K922 Gastrointestinal hemorrhage, unspecified: Secondary | ICD-10-CM

## 2011-11-25 LAB — CBC WITH DIFFERENTIAL/PLATELET
Basophils Relative: 0.3 % (ref 0.0–3.0)
Eosinophils Relative: 2 % (ref 0.0–5.0)
Hemoglobin: 11.1 g/dL — ABNORMAL LOW (ref 13.0–17.0)
Lymphocytes Relative: 38.2 % (ref 12.0–46.0)
MCHC: 33.7 g/dL (ref 30.0–36.0)
Monocytes Relative: 12 % (ref 3.0–12.0)
Neutro Abs: 2.2 10*3/uL (ref 1.4–7.7)
Neutrophils Relative %: 47.5 % (ref 43.0–77.0)
RBC: 3.24 Mil/uL — ABNORMAL LOW (ref 4.22–5.81)
WBC: 4.7 10*3/uL (ref 4.5–10.5)

## 2011-11-25 NOTE — Telephone Encounter (Signed)
Will evaluate hemodynamics and check to see if there is actually blood

## 2011-11-25 NOTE — Telephone Encounter (Signed)
Caller: [name]/Patient; PCP: Tillman Abide I.; CB#: [phone];  Call regarding Black Stools Last Night Has History of Diverticulitis; Mild dizzyiness at times over past few years. Uses cane. Takes Iron but stools not normally black. Coumaden 2.5 mgs daily. INR 2.7 checked on 11/21/11- per pt. Triage and Care advice per Gastrointestinal Bleeding Protocol and appnt scheduled for today 11/25/11 @ 1200.

## 2011-11-25 NOTE — Progress Notes (Signed)
Subjective:    Patient ID: Dylan Perkins, male    DOB: [birth year], 78 y.o.   MRN: [id number]  HPI Feels good Noted black stools last night Usually goes 1-2 per day No stool this AM  On iron regularly but this was different than usual  No nausea or vomiting Appetite is fine  Current Outpatient Prescriptions on File Prior to Visit  Medication Sig Dispense Refill  . alum & mag hydroxide-simeth (MYLANTA) 200-200-20 MG/5ML suspension Take by mouth as needed.        Marland Kitchen aspirin 81 MG tablet Take 81 mg by mouth every other day.        Marland Kitchen BAYER BREEZE 2 TEST DISK TEST UP TO 4 TIMES PER DAY  100 each  2  . ferrous sulfate ([facility] IRON) 325 (65 FE) MG tablet Take 325 mg by mouth daily with breakfast.      . furosemide (LASIX) 40 MG tablet Take 1 tablet (40 mg total) by mouth 2 (two) times daily. Take 80 mg in the am if your weight is 145 or over.  90 tablet  3  . glipiZIDE (GLUCOTROL) 10 MG tablet TAKE 1 TABLET TWICE DAILY  60 tablet  9  . ipratropium (ATROVENT) 0.03 % nasal spray 2 sprays by Nasal route every 12 (twelve) hours as needed.  30 mL  1  . isosorbide mononitrate (IMDUR) 30 MG 24 hr tablet TAKE 1 TABLET EVERY DAY  34 tablet  9  . levothyroxine (SYNTHROID, LEVOTHROID) 25 MCG tablet TAKE 1 TABLET EVERY DAY  90 tablet  3  . LIDODERM 5 %       . losartan (COZAAR) 25 MG tablet Take 1 tablet (25 mg total) by mouth daily.  90 tablet  4  . metoprolol tartrate (LOPRESSOR) 25 MG tablet TAKE 1/2 TABLET BY MOUTH TWICE A DAY  30 tablet  11  . Multiple Vitamin (MULTIVITAMIN) tablet Take 1 tablet by mouth daily after breakfast.        . ranitidine (ZANTAC) 75 MG tablet Take 75 mg by mouth at bedtime.        Marland Kitchen warfarin (COUMADIN) 2.5 MG tablet Take 1 tablet (2.5 mg total) by mouth daily.  30 tablet  11  . warfarin (COUMADIN) 5 MG tablet Take 5 mg by mouth once a week.          Allergies  Allergen Reactions  . Lisinopril     REACTION: cough    Past Medical History  Diagnosis Date  . Allergic  rhinitis   . Anemia     NOS  . CAD (coronary artery disease)   . DM II (diabetes mellitus, type II), controlled   . HLD (hyperlipidemia)   . HTN (hypertension)   . OA (osteoarthritis)   . BPH (benign prostatic hypertrophy)   . Diverticulosis of colon     with repeated bleeds  . Atrial fibrillation or flutter 11/09  . Depression   . CHF (congestive heart failure)     Past Surgical History  Procedure Date  . Tonsillectomy and adenoidectomy 1933  . Coronary artery bypass graft 1995  . Coronary artery bypass graft 1999    Redo  . Hemorrhoid surgery 1974  . Transurethral resection of prostate 1993  . Nasal stenosis repair 1996  . Aortic valve replacement 1999    pig valve  . Total shoulder replacement 1992    right  . Retinal vitrectomy     Left  . Cataract extraction 1999  Family History  Problem Relation Age of Onset  . Cancer Mother     (male)  . Parkinsonism Maternal Grandfather   . Mental retardation Son     History   Social History  . Marital Status: Married    Spouse Name: N/A    Number of Children: 5  . Years of Education: N/A   Occupational History  . 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 Smoking status: Former Smoker    Quit date: 07/18/1950  . Smokeless tobacco: Never Used  . Alcohol Use: Yes  . Drug Use: No  . Sexually Active: Not on file   Other Topics Concern  . Not on file   Social History Narrative   Has living willWife has health care POA---then son, NickRequests DNR and order doneWould not want a feeding tube   Review of Systems Has had strawberries--no apparent exacerbation of divertilitis No abdominal pain No fever    Objective:   Physical Exam  Constitutional: He appears well-developed and well-nourished. No distress.  Abdominal: Soft. Bowel sounds are normal. He exhibits no distension and no mass. There is no tenderness. There is no rebound and no guarding.  Genitourinary:       No  mass Some apparent blood mixed in stool and it is heme positive          Assessment & Plan:

## 2011-11-25 NOTE — Assessment & Plan Note (Addendum)
Unclear if this is upper or lower Appears to be very mild He looks fine Had diverticular bleed in past but this isn't typical of that---though still most likely source  Recent protime (5/6) was good INR 2.7 No symptoms  Will check CBC Hold coumadin for 2 days If bleeding persists, will set up with GI

## 2011-11-25 NOTE — Patient Instructions (Addendum)
Please hold your warfarin(coumadin) today and tomorrow. If the bleeding continues,don't restart the warfarin and  let me know Monday morning and I will set up an appointment with a GI specialist.  If the bleeding stops, restart the coumadin Call 911 for evaluation if the bleeding is worse or you don't feel right

## 2011-11-28 ENCOUNTER — Telehealth: Payer: Self-pay | Admitting: Internal Medicine

## 2011-11-28 DIAGNOSIS — K922 Gastrointestinal hemorrhage, unspecified: Secondary | ICD-10-CM

## 2011-11-28 NOTE — Telephone Encounter (Signed)
Caller: Tom/Patient; PCP: Tillman Abide I.; CB#: [phone]; ; ; Call regarding Patient States He Was Told To Call With Update for Dr Alphonsus Sias and His Stools Are Still Dark.;  Pt is calling back as instructed on Friday 09/25/11. Pt was seen at the office for dark stools that tested hem positive. He is calling back to report he is still having them daily 2x/day. No other changes. He did hold his Coumadin for 2 days as instructed and restarted it last night. Per Dr. Vassie Moselle notes pt needs a G.I referral . Rn called back line and was instructed to send message. Info sent to office.

## 2011-11-29 ENCOUNTER — Other Ambulatory Visit: Payer: Self-pay | Admitting: *Deleted

## 2011-11-29 ENCOUNTER — Telehealth: Payer: Self-pay | Admitting: Internal Medicine

## 2011-11-29 MED ORDER — WARFARIN SODIUM 2.5 MG PO TABS: 2.5000 mg | ORAL_TABLET | Freq: Every day | ORAL | Status: DC

## 2011-11-29 NOTE — Telephone Encounter (Signed)
[facility] Appt made with Amy Esterwood for 11/30/2011 at 1:15 patient aware.

## 2011-11-29 NOTE — Telephone Encounter (Signed)
Pt has had dark stools. Requesting pt be seen. Pt scheduled for appt 11/30/11@1 :30pm. [place] to notify pt of appt date and time.

## 2011-11-30 ENCOUNTER — Ambulatory Visit (INDEPENDENT_AMBULATORY_CARE_PROVIDER_SITE_OTHER): Payer: Medicare Other | Admitting: Physician Assistant

## 2011-11-30 ENCOUNTER — Encounter: Payer: Self-pay | Admitting: Physician Assistant

## 2011-11-30 ENCOUNTER — Other Ambulatory Visit (INDEPENDENT_AMBULATORY_CARE_PROVIDER_SITE_OTHER): Payer: Medicare Other

## 2011-11-30 VITALS — BP 120/74 | HR 60 | Ht 68.0 in | Wt 149.0 lb

## 2011-11-30 DIAGNOSIS — K921 Melena: Secondary | ICD-10-CM

## 2011-11-30 DIAGNOSIS — K5730 Diverticulosis of large intestine without perforation or abscess without bleeding: Secondary | ICD-10-CM

## 2011-11-30 DIAGNOSIS — K5790 Diverticulosis of intestine, part unspecified, without perforation or abscess without bleeding: Secondary | ICD-10-CM

## 2011-11-30 DIAGNOSIS — Z7901 Long term (current) use of anticoagulants: Secondary | ICD-10-CM

## 2011-11-30 LAB — CBC WITH DIFFERENTIAL/PLATELET
Basophils Relative: 0.3 % (ref 0.0–3.0)
Eosinophils Absolute: 0.1 10*3/uL (ref 0.0–0.7)
Eosinophils Relative: 2 % (ref 0.0–5.0)
Lymphocytes Relative: 25.4 % (ref 12.0–46.0)
MCV: 102.7 fl — ABNORMAL HIGH (ref 78.0–100.0)
Monocytes Absolute: 0.6 10*3/uL (ref 0.1–1.0)
Neutrophils Relative %: 61.2 % (ref 43.0–77.0)
Platelets: 157 10*3/uL (ref 150.0–400.0)
RBC: 2.98 Mil/uL — ABNORMAL LOW (ref 4.22–5.81)
WBC: 5.1 10*3/uL (ref 4.5–10.5)

## 2011-11-30 MED ORDER — ESOMEPRAZOLE MAGNESIUM 40 MG PO CPDR: 40.0000 mg | DELAYED_RELEASE_CAPSULE | Freq: Every day | ORAL | Status: DC

## 2011-11-30 NOTE — Progress Notes (Signed)
Reviewed and agree with management. Vlad Mayberry D. Aviella Disbrow, M.D., FACG  

## 2011-11-30 NOTE — Patient Instructions (Signed)
We have given you samples, Nexium, take 1 capsule 30 min before breakfast. Take all the samples then stop.  Please go to the basement level to have your labs drawn.  You will go to [facility] office lab weekly starting on Wednesday 12-07-2011.  You will go weekly for at least a month. They will tell you each time you go if you are to come back the next week.

## 2011-11-30 NOTE — Progress Notes (Signed)
Subjective:    Patient ID: Dylan Perkins, male    DOB: [birth year], 78 y.o.   MRN: [id number]  HPI Dylan Perkins is a very pleasant 78 year old white male, new to GI today number referred by Dr. Alphonsus Sias  with recent complaints of dark stools.. Patient is on chronic Coumadin for atrial fib/flutter over the past few years. He relates history of recurrent diverticular bleeding. He says his first episode was in 1952 he was transfused 6 or 7 units of blood and was in the hospital for many days at that time. He said he had several recurrent episodes of the last was around 2007 when he was hospitalized briefly at [facility]. He says he has undergone colonoscopy and upper endoscopy at [facility] probably in 2005 or 2006 and has never been found to have anything other than diverticulosis. His internist at that time was a Dr. Genelle Gather..  Patient states that his current episode started last Friday, 11/25/2011 and says it was entirely different than his diverticular bleeds of previous at which point he is all had always gushed "" by red blood. He says this episode he had dark stools with small pieces of clot noted on the stool. He had about 2 bowel movements every day and says that the bleeding cleared by Monday 513. This morning he says he had a very normal-appearing "beautiful' bowel movement. He says he did not have any associated abdominal pain or cramping no nausea or vomiting. He said he did feel a little bit weaker woozy on Friday and Saturday but has felt fine over the past 2 days. He saw Dr. Bea Laura and 510 his Coumadin was held for 2 days and he is restarted. His INR was 2.6 on the 10th. Looking back at his labs he had a hemoglobin of 11.8 hematocrit of 34.4 in November of 2012 I. and on 11/25/2011 hemoglobin was 11.1 hematocrit 33 MCV of 101.  Patient has been on long-term iron supplementation of and states that this was started several years ago after one of his bleeding episodes.  Patient relates that he has many serious  health problems; primarily cardiac and has been told that he shouldn't be sedated and therefore is not interested in having any endoscopic evaluation if there is any way to avoid it.    Review of Systems  Constitutional: Negative.   HENT: Negative.   Eyes: Negative.   Respiratory: Negative.   Cardiovascular: Negative.   Gastrointestinal: Positive for blood in stool.  Genitourinary: Negative.   Musculoskeletal: Positive for joint swelling and arthralgias.  Neurological: Negative.   Hematological: Negative.   Psychiatric/Behavioral: Negative.    Outpatient Prescriptions Prior to Visit  Medication Sig Dispense Refill  . alum & mag hydroxide-simeth (MYLANTA) 200-200-20 MG/5ML suspension Take by mouth as needed.        Marland Kitchen aspirin 81 MG tablet Take 81 mg by mouth every other day.        Marland Kitchen BAYER BREEZE 2 TEST DISK TEST UP TO 4 TIMES PER DAY  100 each  2  . calcium carbonate (TUMS) 500 MG chewable tablet Chew 1 tablet by mouth daily.      Marland Kitchen docusate sodium (COLACE) 100 MG capsule Take 100 mg by mouth 2 (two) times daily.      . ferrous sulfate ([employer] IRON) 325 (65 FE) MG tablet Take 325 mg by mouth daily with breakfast.      . furosemide (LASIX) 40 MG tablet Take 1 tablet (40 mg total) by mouth 2 (two)  times daily. Take 80 mg in the am if your weight is 145 or over.  90 tablet  3  . glipiZIDE (GLUCOTROL) 10 MG tablet TAKE 1 TABLET TWICE DAILY  60 tablet  9  . ipratropium (ATROVENT) 0.03 % nasal spray 2 sprays by Nasal route every 12 (twelve) hours as needed.  30 mL  1  . isosorbide mononitrate (IMDUR) 30 MG 24 hr tablet TAKE 1 TABLET EVERY DAY  34 tablet  9  . levothyroxine (SYNTHROID, LEVOTHROID) 25 MCG tablet TAKE 1 TABLET EVERY DAY  90 tablet  3  . LIDODERM 5 %       . losartan (COZAAR) 25 MG tablet Take 1 tablet (25 mg total) by mouth daily.  90 tablet  4  . metoprolol tartrate (LOPRESSOR) 25 MG tablet TAKE 1/2 TABLET BY MOUTH TWICE A DAY  30 tablet  11  . Multiple Vitamin (MULTIVITAMIN)  tablet Take 1 tablet by mouth daily after breakfast.        . warfarin (COUMADIN) 2.5 MG tablet Take 1 tablet (2.5 mg total) by mouth daily.  30 tablet  11  . ranitidine (ZANTAC) 75 MG tablet Take 75 mg by mouth at bedtime.        Marland Kitchen warfarin (COUMADIN) 5 MG tablet Take 5 mg by mouth once a week.         Allergies  Allergen Reactions  . Lisinopril     REACTION: cough   Patient Active Problem List  Diagnoses  . UNSPECIFIED HYPOTHYROIDISM  . DIABETES MELLITUS, TYPE II  . HYPERLIPIDEMIA  . ANEMIA-NOS  . DEPRESSION  . HYPERTENSION  . CORONARY ARTERY DISEASE  . ATRIAL FIBRILLATION  . ATRIAL FLUTTER  . ALLERGIC RHINITIS  . DIVERTICULOSIS, COLON  . BENIGN PROSTATIC HYPERTROPHY  . ACTINIC KERATOSIS  . OSTEOARTHRITIS  . BACK PAIN, LUMBAR  . EDEMA  . CAD, ARTERY BYPASS GRAFT  . Pulmonary HTN  . Polydipsia  . GI bleed      History   Social History  . Marital Status: Married    Spouse Name: N/A    Number of Children: 5  . Years of Education: N/A   Occupational History  . 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 Smoking status: Former Smoker    Quit date: 07/18/1950  . Smokeless tobacco: Never Used  . Alcohol Use: Yes     one daily   . Drug Use: No  . Sexually Active: Not on file   Other Topics Concern  . Not on file   Social History Narrative   Has living willWife has health care POA---then son, NickRequests DNR and order doneWould not want a feeding tubeDaily caffeine     Objective:   Physical Exam elderly white male frail appearing in no acute distress, accompanied by his wife. Blood pressure 120/74 pulse 60 height 5 foot 8 weight 149. HEENT; nontraumatic normocephalic EOMI PERRLA sclera anicteric conjunctiva pink,Neck; Supple positive JVD, Cardiovascular; irregular rate and rhythm with S1-S2 positive systolic murmur, Pulmonary; decreased breath sounds left base, Abdomen; soft, nontender ,nondistended no palpable mass or  hepatosplenomegaly bowel sounds are active no bruit, Rectal; exam scant brown stool which is trace Hemoccult positive no external or internal lesion felt, Extremities; 2+ edema below the knee, Psych; mood and affect normal and appropriate        Assessment & Plan:  #9 78 year old male with recent self-limited low-grade GI bleeding in setting of chronic anticoagulation. It is unclear at  this time whether this represented a very low-grade diverticular bleed versus possible upper GI bleed. #2 Mild anemia secondary to above #3 onset diabetes #4 history of atrial fib/flutter #5 coronary artery disease status post CABG #6 pulmonary hypertension #7 hypo- thyroidism Plan; Long discussion with patient and his wife today regarding conservative management with close observation versus endoscopic evaluation. Patient does not want to undergo endoscopic evaluation at this time and I think that is reasonable given his age and multiple comorbidities. We'll start Nexium 40 mg by mouth daily times one month, samples given Repeat CBC today and will also check iron studies Stop oral iron for now so there will be no confusion will be no confusion should recurrent dark stools occur Plan repeat hemoglobin and hematocrit every week over the next several weeks to assure stability. Patient and his wife are advised that should he have any evidence of recurrent bleeding that they should call on that same day and will need to come in for evaluation. At that point would need to rediscuss need for endoscopic workup.

## 2011-12-01 ENCOUNTER — Telehealth: Payer: Self-pay | Admitting: *Deleted

## 2011-12-01 NOTE — Telephone Encounter (Signed)
Please check his INR at that time also

## 2011-12-01 NOTE — Telephone Encounter (Signed)
Pt called re an appointment for labs next week, he was a little confused re instructions on when to come in. Per [facility] labs he is to come back in 1 week for H&H labs. Do you want him to have inr checked as well, I did not see an specific instructions re his coumadin and next inr check. I scheduled lab appt for 5/22, I just wanted to know if your wanted inr checked then.

## 2011-12-07 ENCOUNTER — Other Ambulatory Visit (INDEPENDENT_AMBULATORY_CARE_PROVIDER_SITE_OTHER): Payer: Medicare Other

## 2011-12-07 ENCOUNTER — Encounter: Payer: Self-pay | Admitting: Cardiovascular Disease

## 2011-12-07 ENCOUNTER — Ambulatory Visit (INDEPENDENT_AMBULATORY_CARE_PROVIDER_SITE_OTHER): Payer: Medicare Other | Admitting: Cardiovascular Disease

## 2011-12-07 ENCOUNTER — Other Ambulatory Visit: Payer: Self-pay | Admitting: *Deleted

## 2011-12-07 VITALS — BP 143/65 | HR 61 | Ht 73.0 in | Wt 147.0 lb

## 2011-12-07 DIAGNOSIS — I10 Essential (primary) hypertension: Secondary | ICD-10-CM

## 2011-12-07 DIAGNOSIS — D649 Anemia, unspecified: Secondary | ICD-10-CM

## 2011-12-07 DIAGNOSIS — K5790 Diverticulosis of intestine, part unspecified, without perforation or abscess without bleeding: Secondary | ICD-10-CM

## 2011-12-07 DIAGNOSIS — K5730 Diverticulosis of large intestine without perforation or abscess without bleeding: Secondary | ICD-10-CM

## 2011-12-07 DIAGNOSIS — E785 Hyperlipidemia, unspecified: Secondary | ICD-10-CM

## 2011-12-07 DIAGNOSIS — I5032 Chronic diastolic (congestive) heart failure: Secondary | ICD-10-CM

## 2011-12-07 DIAGNOSIS — I4891 Unspecified atrial fibrillation: Secondary | ICD-10-CM

## 2011-12-07 DIAGNOSIS — Z7901 Long term (current) use of anticoagulants: Secondary | ICD-10-CM

## 2011-12-07 DIAGNOSIS — K921 Melena: Secondary | ICD-10-CM

## 2011-12-07 DIAGNOSIS — R079 Chest pain, unspecified: Secondary | ICD-10-CM

## 2011-12-07 DIAGNOSIS — R609 Edema, unspecified: Secondary | ICD-10-CM

## 2011-12-07 LAB — CBC
HCT: 32.2 % — ABNORMAL LOW (ref 39.0–52.0)
MCV: 103.7 fl — ABNORMAL HIGH (ref 78.0–100.0)
Platelets: 178 10*3/uL (ref 150.0–400.0)
RBC: 3.1 Mil/uL — ABNORMAL LOW (ref 4.22–5.81)
WBC: 5.4 10*3/uL (ref 4.5–10.5)

## 2011-12-07 MED ORDER — FUROSEMIDE 80 MG PO TABS: 80.0000 mg | ORAL_TABLET | Freq: Two times a day (BID) | ORAL | Status: DC

## 2011-12-07 MED ORDER — ATENOLOL 25 MG PO TABS: 25.0000 mg | ORAL_TABLET | Freq: Every day | ORAL | Status: DC

## 2011-12-07 NOTE — Assessment & Plan Note (Signed)
He does not want any statins. We suggested red yeast rice.

## 2011-12-07 NOTE — Progress Notes (Signed)
Patient ID: Dylan Perkins, male    DOB: 15-Jun-1918, 78 y.o.   MRN: [id number]  HPI Comments: 78 yo WM with history of DM, HTN, hyperlipidemia, CAD s/p CABG and redo, s/p porcine AVR with residual stenosis, and  diagnosis of atrial fibrillation in November 2009 on coumadin therapy,   who comes in today for cardiac follow up. He has intolerance of statins H/o clot to his right eye " that has caused significant vision loss on the right.  He had a  echocardiogram for worsening edema on 07/28/2011 Echocardiogram showed severe pulmonary hypertension. He was started on high-dose Lasix b.i.d.. Echo also showing moderate aortic valve stenosis of his bioprosthetic valve, mildly dilated left atrium  Since his last clinic visit, his weight has been relatively stable at 145 pounds. He continues to have a sore on his leg that has not healed. He has mild continued edema. Also with a cough and shortness of breath when he lies flat. Because of this, he sleeps on several pillows or in a recliner. He does not feel that his weight is very high and reports that today is a bad day with his edema. In the past, He has avoided diuretics in the past secondary to urination problems.  Arterial dopplers in may 2010 showed no obstructive lower extremity arterial disease.     Previous EKG shows atrial fibrillation with ventricular rate 77 beats per minute, intraventricular conduction delay, old anteroseptal infarct, left axis deviation      Outpatient Encounter Prescriptions as of 12/07/2011  Medication Sig Dispense Refill  . alum & mag hydroxide-simeth (MYLANTA) 200-200-20 MG/5ML suspension Take by mouth as needed.        Marland Kitchen aspirin 81 MG tablet Take 81 mg by mouth every other day.        . calcium carbonate (TUMS) 500 MG chewable tablet Chew 1 tablet by mouth daily as needed.       . docusate sodium (COLACE) 100 MG capsule Take 100 mg by mouth 2 (two) times daily.      . furosemide (LASIX) 40 MG tablet Take 1 tablet (40 mg  total) by mouth 2 (two) times daily. Take 80 mg in the am if your weight is 145 or over.  90 tablet  3  . glipiZIDE (GLUCOTROL) 10 MG tablet TAKE 1 TABLET TWICE DAILY  60 tablet  9  . Glucose Blood (BAYER BREEZE 2 TEST) DISK       . ipratropium (ATROVENT) 0.03 % nasal spray 2 sprays by Nasal route every 12 (twelve) hours as needed.  30 mL  1  . isosorbide mononitrate (IMDUR) 30 MG 24 hr tablet TAKE 1 TABLET EVERY DAY  34 tablet  9  . levothyroxine (SYNTHROID, LEVOTHROID) 25 MCG tablet TAKE 1 TABLET EVERY DAY  90 tablet  3  . LIDODERM 5 %       . losartan (COZAAR) 25 MG tablet Take 1 tablet (25 mg total) by mouth daily.  90 tablet  4  . metoprolol tartrate (LOPRESSOR) 25 MG tablet TAKE 1/2 TABLET BY MOUTH TWICE A DAY  30 tablet  11  . Multiple Vitamin (MULTIVITAMIN) tablet Take 1 tablet by mouth daily after breakfast.        . ranitidine (ZANTAC) 150 MG capsule Take 150 mg by mouth daily as needed.       . warfarin (COUMADIN) 2.5 MG tablet Take 1 tablet (2.5 mg total) by mouth daily.  30 tablet  11  . DISCONTD: BAYER BREEZE  2 TEST DISK TEST UP TO 4 TIMES PER DAY  100 each  2  . esomeprazole (NEXIUM) 40 MG capsule Take 1 capsule (40 mg total) by mouth daily before breakfast.  25 capsule  0  . DISCONTD: ferrous sulfate ([facility]S IRON) 325 (65 FE) MG tablet Take 325 mg by mouth daily with breakfast.        Review of Systems  Constitutional: Negative.   HENT: Negative.   Eyes: Negative.   Respiratory: Positive for cough and shortness of breath.   Cardiovascular: Negative.   Gastrointestinal: Negative.   Musculoskeletal: Negative.   Skin: Negative.   Neurological: Negative.   Hematological: Negative.   Psychiatric/Behavioral: Positive for sleep disturbance.  All other systems reviewed and are negative.    BP 143/65  Pulse 61  Ht 6\' 1"  (1.854 m)  Wt 147 lb (66.679 kg)  BMI 19.39 kg/m2  Physical Exam  Nursing note and vitals reviewed. Constitutional: He is oriented to person, place, and  time. He appears well-developed and well-nourished.  HENT:  Head: Normocephalic.  Nose: Nose normal.  Mouth/Throat: Oropharynx is clear and moist.  Eyes: Conjunctivae are normal. Pupils are equal, round, and reactive to light.  Neck: Normal range of motion. Neck supple. No JVD present.  Cardiovascular: Normal rate, S1 normal, S2 normal and intact distal pulses.  An irregularly irregular rhythm present. Exam reveals no gallop and no friction rub.   Murmur heard.  Crescendo systolic murmur is present with a grade of 2/6       edema to below the knees bilaterally, Trace b/l  Pulmonary/Chest: Effort normal. No respiratory distress. He has decreased breath sounds in the right lower field. He has no wheezes. He has no rales. He exhibits no tenderness.  Abdominal: Soft. Bowel sounds are normal. He exhibits no distension. There is no tenderness.  Musculoskeletal: Normal range of motion. He exhibits edema. He exhibits no tenderness.  Lymphadenopathy:    He has no cervical adenopathy.  Neurological: He is alert and oriented to person, place, and time. Coordination normal.  Skin: Skin is warm and dry. No rash noted. No erythema.  Psychiatric: He has a normal mood and affect. His behavior is normal. Judgment and thought content normal.           Assessment and Plan

## 2011-12-07 NOTE — Assessment & Plan Note (Signed)
His weight continues to be several pounds high. Goal weight is 140, he is currently 145 pounds. He is skipping some of his Lasix doses. His legs have significant edema still, persistent right pleural effusion causing shortness of breath and cough when supine. We have recommended he push his weight to 140 pounds approximately to see if this will help his effusion resolved. If his effusion persists, he may require thoracentesis but with mild fluid overload, this would likely recur.

## 2011-12-07 NOTE — Patient Instructions (Addendum)
You are doing well. Please continue lasix 80 mg twice a day with goal weight 140 lbs  Hold metoprolol Start atenolol one a day  Try Red Yeast Rice for cholesterol  Please call us if you have new issues that need to be addressed before your next appt.  Your physician wants you to follow-up in: 3 months.  You will receive a reminder letter in the mail two months in advance. If you don't receive a letter, please call our office to schedule the follow-up appointment.

## 2011-12-07 NOTE — Assessment & Plan Note (Signed)
Blood pressure is well controlled on today's visit. No changes made to the medications. 

## 2011-12-07 NOTE — Assessment & Plan Note (Signed)
Continued getting edema today. Goal weight 140 pounds. Instructed him to increase his Lasix to 80 mg twice a day.

## 2011-12-07 NOTE — Assessment & Plan Note (Signed)
Rate is relatively well controlled. Currently on warfarin.

## 2011-12-14 ENCOUNTER — Other Ambulatory Visit (INDEPENDENT_AMBULATORY_CARE_PROVIDER_SITE_OTHER): Payer: Medicare Other

## 2011-12-14 DIAGNOSIS — I4891 Unspecified atrial fibrillation: Secondary | ICD-10-CM

## 2011-12-14 DIAGNOSIS — Z7901 Long term (current) use of anticoagulants: Secondary | ICD-10-CM

## 2011-12-14 DIAGNOSIS — Z5181 Encounter for therapeutic drug level monitoring: Secondary | ICD-10-CM

## 2011-12-14 DIAGNOSIS — D649 Anemia, unspecified: Secondary | ICD-10-CM

## 2011-12-14 LAB — CBC WITH DIFFERENTIAL/PLATELET
Basophils Absolute: 0 10*3/uL (ref 0.0–0.1)
Basophils Relative: 0.4 % (ref 0.0–3.0)
Eosinophils Absolute: 0.1 10*3/uL (ref 0.0–0.7)
Eosinophils Relative: 1.6 % (ref 0.0–5.0)
HCT: 32.3 % — ABNORMAL LOW (ref 39.0–52.0)
Hemoglobin: 10.6 g/dL — ABNORMAL LOW (ref 13.0–17.0)
Lymphocytes Relative: 33 % (ref 12.0–46.0)
Lymphs Abs: 1.5 10*3/uL (ref 0.7–4.0)
MCHC: 33 g/dL (ref 30.0–36.0)
MCV: 104.9 fl — ABNORMAL HIGH (ref 78.0–100.0)
Monocytes Absolute: 0.4 10*3/uL (ref 0.1–1.0)
Monocytes Relative: 9.4 % (ref 3.0–12.0)
Neutro Abs: 2.6 10*3/uL (ref 1.4–7.7)
Neutrophils Relative %: 55.6 % (ref 43.0–77.0)
Platelets: 138 10*3/uL — ABNORMAL LOW (ref 150.0–400.0)
RBC: 3.08 Mil/uL — ABNORMAL LOW (ref 4.22–5.81)
RDW: 15.1 % — ABNORMAL HIGH (ref 11.5–14.6)
WBC: 4.6 10*3/uL (ref 4.5–10.5)

## 2011-12-15 ENCOUNTER — Other Ambulatory Visit: Payer: Self-pay | Admitting: Internal Medicine

## 2011-12-15 DIAGNOSIS — K922 Gastrointestinal hemorrhage, unspecified: Secondary | ICD-10-CM

## 2011-12-16 ENCOUNTER — Other Ambulatory Visit: Payer: Self-pay | Admitting: *Deleted

## 2011-12-16 DIAGNOSIS — D649 Anemia, unspecified: Secondary | ICD-10-CM

## 2011-12-19 ENCOUNTER — Ambulatory Visit: Payer: Medicare Other

## 2011-12-19 ENCOUNTER — Ambulatory Visit (INDEPENDENT_AMBULATORY_CARE_PROVIDER_SITE_OTHER): Payer: Medicare Other | Admitting: Internal Medicine

## 2011-12-19 DIAGNOSIS — I4891 Unspecified atrial fibrillation: Secondary | ICD-10-CM

## 2011-12-19 LAB — POCT INR: INR: 3.2

## 2011-12-19 NOTE — Patient Instructions (Signed)
Hold 1 dose then Continue 2.5 mg daily, recheck 2 weeks

## 2011-12-20 ENCOUNTER — Telehealth: Payer: Self-pay | Admitting: Internal Medicine

## 2011-12-20 NOTE — Telephone Encounter (Signed)
Pt is having a biopsy done on his calf this coming Friday 12/23/11 and is taking coumadin. He is wanting to know if he should back off his coumadin meds before his biopsy.

## 2011-12-21 ENCOUNTER — Ambulatory Visit: Payer: Medicare Other

## 2011-12-21 ENCOUNTER — Other Ambulatory Visit: Payer: Medicare Other

## 2011-12-21 NOTE — Telephone Encounter (Signed)
.  left message to have patient return my call.  

## 2011-12-21 NOTE — Telephone Encounter (Signed)
Does he mean with the gastroenterologist looking at his stomach or colon? If so, yes----he should stop the coumadin until after the procedure

## 2011-12-22 NOTE — Telephone Encounter (Signed)
Yes, have him stop the coumadin till after the biopsy

## 2011-12-22 NOTE — Telephone Encounter (Signed)
Spoke with patient and he's having a biopsy done in [place] tomorrow on his right lower calf, pt thinks it maybe a skin cancer because he had one taking off ago and that's what that one was. Per pt he's a bleeder and thought he should stop his coumadin. Please advise

## 2011-12-22 NOTE — Telephone Encounter (Signed)
Spoke with patient and advised results   

## 2012-01-02 ENCOUNTER — Ambulatory Visit: Payer: Medicare Other

## 2012-01-04 ENCOUNTER — Ambulatory Visit (INDEPENDENT_AMBULATORY_CARE_PROVIDER_SITE_OTHER): Payer: Medicare Other | Admitting: Internal Medicine

## 2012-01-04 DIAGNOSIS — I4891 Unspecified atrial fibrillation: Secondary | ICD-10-CM

## 2012-01-04 DIAGNOSIS — Z5181 Encounter for therapeutic drug level monitoring: Secondary | ICD-10-CM

## 2012-01-04 DIAGNOSIS — Z7901 Long term (current) use of anticoagulants: Secondary | ICD-10-CM

## 2012-01-04 LAB — POCT INR: INR: 2.6

## 2012-01-04 NOTE — Patient Instructions (Signed)
Continue 2.5 mg daily, recheck 4 weeks 

## 2012-01-13 ENCOUNTER — Telehealth: Payer: Self-pay | Admitting: *Deleted

## 2012-01-13 NOTE — Telephone Encounter (Signed)
Message copied by Daphine Deutscher on Fri Jan 13, 2012  3:12 PM ------      Message from: Daphine Deutscher      Created: Fri Dec 16, 2011  8:43 AM       Call and remind patient have labs done at [facility] for AE(CBC)

## 2012-01-13 NOTE — Telephone Encounter (Signed)
Spoke with patient and reminded him of labs due next week. 

## 2012-01-16 ENCOUNTER — Other Ambulatory Visit: Payer: Medicare Other

## 2012-01-16 DIAGNOSIS — D649 Anemia, unspecified: Secondary | ICD-10-CM

## 2012-01-16 LAB — CBC WITH DIFFERENTIAL/PLATELET
Basophils Relative: 0.5 % (ref 0.0–3.0)
Eosinophils Relative: 0.9 % (ref 0.0–5.0)
HCT: 32.2 % — ABNORMAL LOW (ref 39.0–52.0)
Hemoglobin: 10.8 g/dL — ABNORMAL LOW (ref 13.0–17.0)
Lymphs Abs: 1.8 10*3/uL (ref 0.7–4.0)
MCV: 102.5 fl — ABNORMAL HIGH (ref 78.0–100.0)
Monocytes Absolute: 0.5 10*3/uL (ref 0.1–1.0)
Monocytes Relative: 10.5 % (ref 3.0–12.0)
Neutro Abs: 2.8 10*3/uL (ref 1.4–7.7)
Platelets: 129 10*3/uL — ABNORMAL LOW (ref 150.0–400.0)
WBC: 5.2 10*3/uL (ref 4.5–10.5)

## 2012-01-17 ENCOUNTER — Other Ambulatory Visit: Payer: Self-pay | Admitting: *Deleted

## 2012-01-17 DIAGNOSIS — D649 Anemia, unspecified: Secondary | ICD-10-CM

## 2012-01-23 ENCOUNTER — Other Ambulatory Visit: Payer: Self-pay | Admitting: Internal Medicine

## 2012-01-25 ENCOUNTER — Ambulatory Visit (INDEPENDENT_AMBULATORY_CARE_PROVIDER_SITE_OTHER): Payer: Medicare Other | Admitting: Internal Medicine

## 2012-01-25 ENCOUNTER — Encounter: Payer: Self-pay | Admitting: Internal Medicine

## 2012-01-25 VITALS — BP 128/70 | HR 72 | Temp 98.6°F | Ht 68.0 in | Wt 146.0 lb

## 2012-01-25 DIAGNOSIS — F329 Major depressive disorder, single episode, unspecified: Secondary | ICD-10-CM

## 2012-01-25 DIAGNOSIS — I4891 Unspecified atrial fibrillation: Secondary | ICD-10-CM

## 2012-01-25 DIAGNOSIS — Z7901 Long term (current) use of anticoagulants: Secondary | ICD-10-CM

## 2012-01-25 DIAGNOSIS — E119 Type 2 diabetes mellitus without complications: Secondary | ICD-10-CM

## 2012-01-25 DIAGNOSIS — Z5181 Encounter for therapeutic drug level monitoring: Secondary | ICD-10-CM

## 2012-01-25 DIAGNOSIS — I5032 Chronic diastolic (congestive) heart failure: Secondary | ICD-10-CM

## 2012-01-25 DIAGNOSIS — I509 Heart failure, unspecified: Secondary | ICD-10-CM

## 2012-01-25 DIAGNOSIS — I10 Essential (primary) hypertension: Secondary | ICD-10-CM

## 2012-01-25 LAB — BASIC METABOLIC PANEL
BUN: 39 mg/dL — ABNORMAL HIGH (ref 6–23)
CO2: 30 mEq/L (ref 19–32)
Calcium: 8.8 mg/dL (ref 8.4–10.5)
Chloride: 98 mEq/L (ref 96–112)
Creatinine, Ser: 1.5 mg/dL (ref 0.4–1.5)
Glucose, Bld: 219 mg/dL — ABNORMAL HIGH (ref 70–99)

## 2012-01-25 LAB — POCT INR: INR: 2.2

## 2012-01-25 NOTE — Progress Notes (Signed)
Subjective:    Patient ID: Dylan Perkins, male    DOB: Mar 16, 1918, 78 y.o.   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 Mostly under 100 now Had hypoglycemia on bid glipizide so back to just daily now  Weight is generally around 140# at home Chronic DOE Hasn't given up any activities recently No chest pain occ palpitations---esp noticeable when he goes to bed at night  No recurrence of rectal bleeding Reviewed GI note Hgb has been stable On double antiacid Rx  Still frustrated and depressed about poor vision Getting injections still  Current Outpatient Prescriptions on File Prior to Visit  Medication Sig Dispense Refill  . alum & mag hydroxide-simeth (MYLANTA) 200-200-20 MG/5ML suspension Take by mouth as needed.        Marland Kitchen aspirin 81 MG tablet Take 81 mg by mouth every other day.        Marland Kitchen atenolol (TENORMIN) 25 MG tablet Take 1 tablet (25 mg total) by mouth daily.  90 tablet  6  . BAYER BREEZE 2 TEST DISK TEST UP TO 4 TIMES PER DAY  100 each  3  . calcium carbonate (TUMS) 500 MG chewable tablet Chew 1 tablet by mouth daily as needed.       . docusate sodium (COLACE) 100 MG capsule Take 100 mg by mouth 2 (two) times daily.      Marland Kitchen esomeprazole (NEXIUM) 40 MG capsule Take 1 capsule (40 mg total) by mouth daily before breakfast.  25 capsule  0  . furosemide (LASIX) 80 MG tablet Take 1 tablet (80 mg total) by mouth 2 (two) times daily. Take 80 mg in the am if your weight is 145 or over.  180 tablet  3  . glipiZIDE (GLUCOTROL) 10 MG tablet TAKE 1 TABLET TWICE DAILY  60 tablet  9  . ipratropium (ATROVENT) 0.03 % nasal spray 2 sprays by Nasal route every 12 (twelve) hours as needed.  30 mL  1  . isosorbide mononitrate (IMDUR) 30 MG 24 hr tablet TAKE 1 TABLET EVERY DAY  34 tablet  9  . levothyroxine (SYNTHROID, LEVOTHROID) 25 MCG tablet TAKE 1 TABLET EVERY DAY  90  tablet  3  . LIDODERM 5 %       . losartan (COZAAR) 25 MG tablet Take 1 tablet (25 mg total) by mouth daily.  90 tablet  4  . Multiple Vitamin (MULTIVITAMIN) tablet Take 1 tablet by mouth daily after breakfast.        . ranitidine (ZANTAC) 150 MG capsule Take 150 mg by mouth daily as needed.       . warfarin (COUMADIN) 2.5 MG tablet Take 1 tablet (2.5 mg total) by mouth daily.  30 tablet  11    Allergies  Allergen Reactions  . Lisinopril     REACTION: cough    Past Medical History  Diagnosis Date  . Allergic rhinitis   . Anemia     NOS  . CAD (coronary artery disease)   . DM II (diabetes mellitus, type II), controlled   . HLD (hyperlipidemia)   . HTN (hypertension)   . OA (osteoarthritis)   . BPH (benign prostatic hypertrophy)   . Diverticulosis of colon     with repeated bleeds  . Atrial fibrillation or flutter 11/09  . Depression   . CHF (congestive heart failure)   . Thyroid disease   .  GI bleed 2013    Non determined    Past Surgical History  Procedure Date  . Tonsillectomy and adenoidectomy 1933  . Coronary artery bypass graft 1995  . Coronary artery bypass graft 1999    Redo  . Hemorrhoid surgery 1974  . Transurethral resection of prostate 1993  . Nasal stenosis repair 1996  . Aortic valve replacement 1999    pig valve  . Total shoulder replacement 1992    right  . Retinal vitrectomy     Left  . Cataract extraction 1999    Family History  Problem Relation Age of Onset  . Cancer Mother     (male)  . Parkinsonism Maternal Grandfather   . Mental retardation Son   . Colon cancer Neg Hx     History   Social History  . Marital Status: Married    Spouse Name: N/A    Number of Children: 5  . Years of Education: N/A   Occupational History  . 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 Smoking status: Former Smoker    Quit date: 07/18/1950  . Smokeless tobacco: Never Used  . Alcohol Use: Yes     one daily   .  Drug Use: No  . Sexually Active: Not on file   Other Topics Concern  . Not on file   Social History Narrative   Has living willWife has health care POA---then son, NickRequests DNR and order doneWould not want a feeding tubeDaily caffeine    Review of Systems Appetite is okay Sleeps okay Some anhedonia but still enjoys "just sitting around" and exercise class     Objective:   Physical Exam  Constitutional: He appears well-developed. No distress.  Neck: Normal range of motion.  Cardiovascular: Normal rate.  Exam reveals no gallop.   Murmur heard.      Irregular Faint systolic murmur  Pulmonary/Chest: Effort normal. No respiratory distress. He has no wheezes. He has no rales.       ?dullness and decreased breath sounds at very base on right  Musculoskeletal: He exhibits edema.       Trace pedal edema Pulses not palpable  Lymphadenopathy:    He has no cervical adenopathy.  Skin:       No foot lesions          Assessment & Plan:

## 2012-01-25 NOTE — Assessment & Plan Note (Signed)
Good rate control Continues on coumadin 

## 2012-01-25 NOTE — Assessment & Plan Note (Signed)
Frustrated with his restrictions No MDD though so no Rx

## 2012-01-25 NOTE — Assessment & Plan Note (Signed)
BP Readings from Last 3 Encounters:  01/25/12 128/70  12/07/11 143/65  11/30/11 120/74  good control No changes

## 2012-01-25 NOTE — Assessment & Plan Note (Signed)
NYHA class 2 symptoms by history Mild fluid overload but home weights stable Will check renal No increase diuretic unless weight goes up from here

## 2012-01-25 NOTE — Patient Instructions (Signed)
Continue 2.5 mg daily, recheck 4 weeks 

## 2012-01-25 NOTE — Assessment & Plan Note (Signed)
Lab Results  Component Value Date   HGBA1C 8.7* 09/23/2011   Running under 100 fasting almost every day Hypoglycemia with increased meds Goal A1c under 9%

## 2012-01-30 ENCOUNTER — Encounter: Payer: Self-pay | Admitting: *Deleted

## 2012-02-01 ENCOUNTER — Ambulatory Visit: Payer: Medicare Other

## 2012-02-22 ENCOUNTER — Ambulatory Visit (INDEPENDENT_AMBULATORY_CARE_PROVIDER_SITE_OTHER): Payer: Medicare Other | Admitting: Family Medicine

## 2012-02-22 DIAGNOSIS — Z5181 Encounter for therapeutic drug level monitoring: Secondary | ICD-10-CM

## 2012-02-22 DIAGNOSIS — I4891 Unspecified atrial fibrillation: Secondary | ICD-10-CM

## 2012-02-22 DIAGNOSIS — Z7901 Long term (current) use of anticoagulants: Secondary | ICD-10-CM

## 2012-02-22 NOTE — Patient Instructions (Signed)
Continue current dose, check in 4 weeks  

## 2012-03-09 ENCOUNTER — Ambulatory Visit (INDEPENDENT_AMBULATORY_CARE_PROVIDER_SITE_OTHER): Payer: Medicare Other | Admitting: Cardiovascular Disease

## 2012-03-09 ENCOUNTER — Encounter: Payer: Self-pay | Admitting: Cardiovascular Disease

## 2012-03-09 ENCOUNTER — Ambulatory Visit: Payer: Self-pay | Admitting: Cardiovascular Disease

## 2012-03-09 VITALS — BP 122/70 | HR 78 | Ht 68.0 in | Wt 145.5 lb

## 2012-03-09 DIAGNOSIS — I4891 Unspecified atrial fibrillation: Secondary | ICD-10-CM

## 2012-03-09 DIAGNOSIS — I5032 Chronic diastolic (congestive) heart failure: Secondary | ICD-10-CM

## 2012-03-09 DIAGNOSIS — R0602 Shortness of breath: Secondary | ICD-10-CM

## 2012-03-09 DIAGNOSIS — I25810 Atherosclerosis of coronary artery bypass graft(s) without angina pectoris: Secondary | ICD-10-CM

## 2012-03-09 DIAGNOSIS — I509 Heart failure, unspecified: Secondary | ICD-10-CM | POA: Insufficient documentation

## 2012-03-09 DIAGNOSIS — R609 Edema, unspecified: Secondary | ICD-10-CM

## 2012-03-09 DIAGNOSIS — I10 Essential (primary) hypertension: Secondary | ICD-10-CM

## 2012-03-09 MED ORDER — METOLAZONE 5 MG PO TABS: 5.0000 mg | ORAL_TABLET | Freq: Every day | ORAL | Status: DC

## 2012-03-09 NOTE — Assessment & Plan Note (Addendum)
Severe pulmonary hypertension. He has woody edema, pleural effusion worse on the right which would explain his cough when supine in bed. We have suggested he take his Lasix twice a day religiously, probably requiring 80 mg twice a day depending on his by mouth fluid intake. We have given him metolazone to take for any weight gain, worsening cough or shortness of breath. I suggested he take this possibly up to 3 times per week 30 minutes prior to his a.m. Lasix. If he continues to have worsening symptoms, we would change Lasix to torsemide 40 mg twice a day with metolazone in a.m. (2.5 mg). Typically we need to run his creatinine high for symptom relief.

## 2012-03-09 NOTE — Assessment & Plan Note (Signed)
Will pursue aggressive diuresis to minimize worsening of his effusion. If symptoms get worse with worsening cough or shortness of breath, mainly to evaluate for thoracentesis.

## 2012-03-09 NOTE — Assessment & Plan Note (Signed)
Woody edema likely secondary to underlying pulmonary hypertension. We'll need to continue aggressive diuresis.

## 2012-03-09 NOTE — Assessment & Plan Note (Signed)
Currently with no symptoms of angina. No further workup at this time. Continue current medication regimen. 

## 2012-03-09 NOTE — Patient Instructions (Addendum)
You have extra fluid in your legs and lungs. Please take metolazone one pill 30 minutes before the morning furosemide up to three times a week for edema or shortness of breath  We will schedule you for a chest xray today  Please call us if you have new issues that need to be addressed before your next appt.  Your physician wants you to follow-up in: 3 months.  You will receive a reminder letter in the mail two months in advance. If you don't receive a letter, please call our office to schedule the follow-up appointment.

## 2012-03-09 NOTE — Assessment & Plan Note (Signed)
Rate is relatively well controlled on his chronic medications.

## 2012-03-09 NOTE — Progress Notes (Signed)
Patient ID: Dylan Perkins, male    DOB: 12-Aug-1917, 78 y.o.   MRN: [id number]  HPI Comments: 78 yo WM with history of DM, HTN, hyperlipidemia, CAD s/p CABG and redo, s/p porcine AVR with moderate residual stenosis, and  diagnosis of atrial fibrillation in November 2009 on coumadin therapy,    intolerance of statins, who presents for routine followup.  H/o clot to his right eye " that has caused significant vision loss on the right.   He had a  echocardiogram for worsening edema on 07/28/2011 Echocardiogram showed severe pulmonary hypertension. He was started on high-dose Lasix b.i.d.. Echo also showing moderate aortic valve stenosis of his bioprosthetic valve, mildly dilated left atrium  He reports having significant lower extremity edema and shortness of breath, inability to lay supine secondary to coughing. He has been cutting back on his Lasix sometimes taking 40 mg, typically taking some dose twice a day. He does not like his way to do less than 140 for uncertain reasons. Continues to have woody edema and ulcers of his lower extremities.  Chest x-ray today shows right-sided pleural effusion, worse on the right than the left.    EKG shows atrial fibrillation with ventricular rate 78 beats per minute, intraventricular conduction delay, old anteroseptal infarct, left axis deviation      Outpatient Encounter Prescriptions as of 03/09/2012  Medication Sig Dispense Refill  . alum & mag hydroxide-simeth (MYLANTA) 200-200-20 MG/5ML suspension Take by mouth as needed.        Marland Kitchen aspirin 81 MG tablet Take 81 mg by mouth every other day.        Marland Kitchen atenolol (TENORMIN) 25 MG tablet Take 1 tablet (25 mg total) by mouth daily.  90 tablet  6  . BAYER BREEZE 2 TEST DISK TEST UP TO 4 TIMES PER DAY  100 each  3  . calcium carbonate (TUMS) 500 MG chewable tablet Chew 1 tablet by mouth daily as needed.       . docusate sodium (COLACE) 100 MG capsule Take 100 mg by mouth 2 (two) times daily.      . furosemide  (LASIX) 80 MG tablet Take 1 tablet (80 mg total) by mouth 2 (two) times daily. Take 80 mg in the am if your weight is 145 or over.  180 tablet  3  . glipiZIDE (GLUCOTROL) 10 MG tablet TAKE 1 TABLET TWICE DAILY  60 tablet  9  . ipratropium (ATROVENT) 0.03 % nasal spray 2 sprays by Nasal route every 12 (twelve) hours as needed.  30 mL  1  . isosorbide mononitrate (IMDUR) 30 MG 24 hr tablet TAKE 1 TABLET EVERY DAY  34 tablet  9  . LIDODERM 5 %       . losartan (COZAAR) 25 MG tablet Take 1 tablet (25 mg total) by mouth daily.  90 tablet  4  . Multiple Vitamin (MULTIVITAMIN) tablet Take 1 tablet by mouth daily after breakfast.        . ranitidine (ZANTAC) 150 MG capsule Take 150 mg by mouth daily as needed.       . warfarin (COUMADIN) 2.5 MG tablet Take 1 tablet (2.5 mg total) by mouth daily.  30 tablet  11  . DISCONTD: esomeprazole (NEXIUM) 40 MG capsule Take 1 capsule (40 mg total) by mouth daily before breakfast.  25 capsule  0  . DISCONTD: levothyroxine (SYNTHROID, LEVOTHROID) 25 MCG tablet TAKE 1 TABLET EVERY DAY  90 tablet  3    Review of  Systems  Constitutional: Negative.   HENT: Negative.   Eyes: Negative.   Respiratory: Positive for cough and shortness of breath.   Cardiovascular: Positive for leg swelling.  Gastrointestinal: Negative.   Musculoskeletal: Negative.   Skin: Negative.   Neurological: Negative.   Hematological: Negative.   Psychiatric/Behavioral: Positive for disturbed wake/sleep cycle.  All other systems reviewed and are negative.    BP 122/70  Pulse 78  Ht 5\' 8"  (1.727 m)  Wt 145 lb 8 oz (65.998 kg)  BMI 22.12 kg/m2  Physical Exam  Nursing note and vitals reviewed. Constitutional: He is oriented to person, place, and time. He appears well-developed and well-nourished.  HENT:  Head: Normocephalic.  Nose: Nose normal.  Mouth/Throat: Oropharynx is clear and moist.  Eyes: Conjunctivae are normal. Pupils are equal, round, and reactive to light.  Neck: Normal  range of motion. Neck supple. No JVD present.  Cardiovascular: Normal rate, S1 normal, S2 normal and intact distal pulses.  An irregularly irregular rhythm present. Exam reveals no gallop and no friction rub.   Murmur heard.  Crescendo systolic murmur is present with a grade of 2/6       edema to below the knees bilaterally, 1+ pitting, woody  Pulmonary/Chest: Effort normal. No respiratory distress. He has decreased breath sounds in the right lower field. He has no wheezes. He has no rales. He exhibits no tenderness.  Abdominal: Soft. Bowel sounds are normal. He exhibits no distension. There is no tenderness.  Musculoskeletal: Normal range of motion. He exhibits edema. He exhibits no tenderness.  Lymphadenopathy:    He has no cervical adenopathy.  Neurological: He is alert and oriented to person, place, and time. Coordination normal.  Skin: Skin is warm and dry. No rash noted. No erythema.  Psychiatric: He has a normal mood and affect. His behavior is normal. Judgment and thought content normal.           Assessment and Plan

## 2012-03-15 ENCOUNTER — Telehealth: Payer: Self-pay

## 2012-03-15 ENCOUNTER — Other Ambulatory Visit: Payer: Self-pay

## 2012-03-15 ENCOUNTER — Other Ambulatory Visit (INDEPENDENT_AMBULATORY_CARE_PROVIDER_SITE_OTHER): Payer: Medicare Other

## 2012-03-15 DIAGNOSIS — R531 Weakness: Secondary | ICD-10-CM

## 2012-03-15 DIAGNOSIS — R5383 Other fatigue: Secondary | ICD-10-CM

## 2012-03-15 NOTE — Telephone Encounter (Signed)
Pt calling for CXR results. I explained results were also sent to PCP, but that it did show some fluid overload, per Dr. Mariah Milling, and should continue with diuresis as planned. Pt confirms he is taking metolazone and lasix as prescribed, but weight has gone from 145 pounds (home weight) to 135 pounds (home weight) in 6 days. He says he feels "washed out".  He admits sob and edema have improved.  He decreased lasix yesterday to 40 mg BID (instead of 80 mg BID). He took 80 mg lasix this am as usual, but I advised him to hold PM dose of lasix altogether (since it sounds as if he may be dry).  There is not a recent BMP in chart, so I advised him to come in for this lab work so we can assess kidney fxn and know how to further help pt. Understanding verb.

## 2012-03-16 LAB — BASIC METABOLIC PANEL
BUN/Creatinine Ratio: 24 — ABNORMAL HIGH (ref 10–22)
CO2: 25 mmol/L (ref 19–28)
Chloride: 89 mmol/L — ABNORMAL LOW (ref 97–108)
GFR calc Af Amer: 47 mL/min/{1.73_m2} — ABNORMAL LOW (ref >59)
Sodium: 131 mmol/L — ABNORMAL LOW (ref 134–144)

## 2012-03-16 NOTE — Telephone Encounter (Signed)
I called pt with preliminary BMP results.  Creat=1.48.  He says he "feels pretty good today". He denies worsening sob or edema She continues to take lasix 80 mg BID I advised him, d/t elevated creatinine and low home weight (135 pounds) to decrease lasix to 1/2 tablet BID.  He verb. Understanding and will call us should he develop worsening sob or edema. He has appt for coumadin check next week and will update [place] at that time as well.

## 2012-03-16 NOTE — Telephone Encounter (Signed)
FYI See below 

## 2012-03-19 ENCOUNTER — Other Ambulatory Visit: Payer: Self-pay | Admitting: Internal Medicine

## 2012-03-19 NOTE — Telephone Encounter (Signed)
He had fluid overload at 145 Creatinine not very high, slight Similar to last month before 10lb weight loss Would tell him to aim for weight in the mid to high 130s If weight trending up, go back on lasis 80 mg BID, For weight over 140, restart metolazone Ulcers on legs will heal with less edema, weight less than 140, goal 135 to 138

## 2012-03-20 NOTE — Telephone Encounter (Signed)
Pt informed Understanding verb 

## 2012-03-21 ENCOUNTER — Ambulatory Visit (INDEPENDENT_AMBULATORY_CARE_PROVIDER_SITE_OTHER): Payer: Medicare Other | Admitting: Internal Medicine

## 2012-03-21 ENCOUNTER — Ambulatory Visit: Payer: Medicare Other

## 2012-03-21 DIAGNOSIS — I4891 Unspecified atrial fibrillation: Secondary | ICD-10-CM

## 2012-03-21 DIAGNOSIS — Z5181 Encounter for therapeutic drug level monitoring: Secondary | ICD-10-CM

## 2012-03-21 DIAGNOSIS — Z7901 Long term (current) use of anticoagulants: Secondary | ICD-10-CM

## 2012-03-21 LAB — POCT INR: INR: 3.1

## 2012-03-21 NOTE — Patient Instructions (Addendum)
Decrease to  2.5 mg daily, except Wed, Sat 1.25 recheck 2 weeks

## 2012-03-23 ENCOUNTER — Telehealth: Payer: Self-pay | Admitting: *Deleted

## 2012-03-23 NOTE — Telephone Encounter (Signed)
Spoke with patient's wife and she will have patient get labs next week at [facility]

## 2012-03-23 NOTE — Telephone Encounter (Signed)
Message copied by Dylan Perkins on Fri Mar 23, 2012 11:23 AM ------      Message from: Dylan Perkins      Created: Tue Jan 17, 2012  2:54 PM       Call and remind patient CBC due for DB

## 2012-04-03 ENCOUNTER — Telehealth: Payer: Self-pay | Admitting: *Deleted

## 2012-04-03 ENCOUNTER — Other Ambulatory Visit: Payer: Self-pay | Admitting: *Deleted

## 2012-04-03 DIAGNOSIS — D649 Anemia, unspecified: Secondary | ICD-10-CM

## 2012-04-03 NOTE — Telephone Encounter (Signed)
Spoke with patient and he is going to get labs tomorrow

## 2012-04-03 NOTE — Telephone Encounter (Signed)
Message copied by Daphine Deutscher on Tue Apr 03, 2012  1:29 PM ------      Message from: Daphine Deutscher      Created: Fri Mar 23, 2012 11:25 AM       Did patient get CBC doe DB

## 2012-04-04 ENCOUNTER — Ambulatory Visit (INDEPENDENT_AMBULATORY_CARE_PROVIDER_SITE_OTHER): Payer: Medicare Other | Admitting: Internal Medicine

## 2012-04-04 DIAGNOSIS — Z5181 Encounter for therapeutic drug level monitoring: Secondary | ICD-10-CM

## 2012-04-04 DIAGNOSIS — I4891 Unspecified atrial fibrillation: Secondary | ICD-10-CM

## 2012-04-04 DIAGNOSIS — D649 Anemia, unspecified: Secondary | ICD-10-CM

## 2012-04-04 DIAGNOSIS — Z7901 Long term (current) use of anticoagulants: Secondary | ICD-10-CM

## 2012-04-04 LAB — POCT INR: INR: 1.8

## 2012-04-04 LAB — CBC WITH DIFFERENTIAL/PLATELET
Basophils Absolute: 0 10*3/uL (ref 0.0–0.1)
Eosinophils Relative: 8.7 % — ABNORMAL HIGH (ref 0.0–5.0)
Lymphocytes Relative: 25.5 % (ref 12.0–46.0)
Monocytes Relative: 9.4 % (ref 3.0–12.0)
Platelets: 160 10*3/uL (ref 150.0–400.0)
RDW: 14.6 % (ref 11.5–14.6)
WBC: 5.4 10*3/uL (ref 4.5–10.5)

## 2012-04-04 NOTE — Patient Instructions (Addendum)
INCREASE to  2.5 mg daily, except  1.25MG  SAT recheck 2 weeks

## 2012-04-09 ENCOUNTER — Other Ambulatory Visit: Payer: Self-pay | Admitting: *Deleted

## 2012-04-09 MED ORDER — IPRATROPIUM BROMIDE 0.03 % NA SOLN: 2.0000 | Freq: Two times a day (BID) | NASAL | Status: DC | PRN

## 2012-04-12 ENCOUNTER — Ambulatory Visit (INDEPENDENT_AMBULATORY_CARE_PROVIDER_SITE_OTHER): Payer: Medicare Other | Admitting: Internal Medicine

## 2012-04-12 ENCOUNTER — Encounter: Payer: Self-pay | Admitting: Internal Medicine

## 2012-04-12 VITALS — BP 130/70 | HR 68 | Temp 98.3°F | Wt 142.0 lb

## 2012-04-12 DIAGNOSIS — M19019 Primary osteoarthritis, unspecified shoulder: Secondary | ICD-10-CM

## 2012-04-12 DIAGNOSIS — L89309 Pressure ulcer of unspecified buttock, unspecified stage: Secondary | ICD-10-CM

## 2012-04-12 DIAGNOSIS — L8990 Pressure ulcer of unspecified site, unspecified stage: Secondary | ICD-10-CM

## 2012-04-12 MED ORDER — TRAMADOL HCL 50 MG PO TABS: 50.0000 mg | ORAL_TABLET | Freq: Every evening | ORAL | Status: DC | PRN

## 2012-04-12 NOTE — Patient Instructions (Signed)
Please ask your pharmacist for Endit cream, or use thick application of thick zinc oxide cream over the sore area until it heals over

## 2012-04-12 NOTE — Assessment & Plan Note (Signed)
Has been there for about 4 weeks Now has stable eschar Discussed more occlusive creams but not clearly needed

## 2012-04-12 NOTE — Progress Notes (Signed)
Subjective:    Patient ID: Dylan Perkins, male    DOB: Jul 15, 1918, 78 y.o.   MRN: [id number]  HPI Still having shoulder problems Ready for a cortisone shot Really bothers him at night He has had shoulder replacement though (right)  Has sore on back he wants checked  Right below the tailbone Relates to when he was very constipated and got sore on buttocks No discharge Has improved over time with a variety of OTC creams---feels his antibiotic cream has worked the best Current Outpatient Prescriptions on File Prior to Visit  Medication Sig Dispense Refill  . alum & mag hydroxide-simeth (MYLANTA) 200-200-20 MG/5ML suspension Take by mouth as needed.        Marland Kitchen aspirin 81 MG tablet Take 81 mg by mouth every other day.        Marland Kitchen atenolol (TENORMIN) 25 MG tablet Take 1 tablet (25 mg total) by mouth daily.  90 tablet  6  . BAYER BREEZE 2 TEST DISK TEST UP TO 4 TIMES PER DAY  100 each  3  . calcium carbonate (TUMS) 500 MG chewable tablet Chew 1 tablet by mouth daily as needed.       . docusate sodium (COLACE) 100 MG capsule Take 100 mg by mouth 2 (two) times daily.      Marland Kitchen glipiZIDE (GLUCOTROL) 10 MG tablet Take 10 mg by mouth 2 (two) times daily before a meal.       . ipratropium (ATROVENT) 0.03 % nasal spray Place 2 sprays into the nose every 12 (twelve) hours as needed.  30 mL  1  . isosorbide mononitrate (IMDUR) 30 MG 24 hr tablet Take 1 tablet (30 mg total) by mouth daily.  30 tablet  1  . levothyroxine (SYNTHROID, LEVOTHROID) 25 MCG tablet Take 25 mcg by mouth daily.       Marland Kitchen LIDODERM 5 % as needed.       Marland Kitchen losartan (COZAAR) 25 MG tablet Take 1 tablet (25 mg total) by mouth daily.  90 tablet  4  . metolazone (ZAROXOLYN) 5 MG tablet Take 1 tablet (5 mg total) by mouth daily.  30 tablet  6  . Multiple Vitamin (MULTIVITAMIN) tablet Take 1 tablet by mouth daily after breakfast.        . ranitidine (ZANTAC) 150 MG capsule Take 150 mg by mouth daily as needed.       . warfarin (COUMADIN) 2.5 MG  tablet Take 1 tablet (2.5 mg total) by mouth daily.  30 tablet  11  . DISCONTD: furosemide (LASIX) 80 MG tablet Take 1 tablet (80 mg total) by mouth 2 (two) times daily. Take 80 mg in the am if your weight is 145 or over.  180 tablet  3    Allergies  Allergen Reactions  . Lisinopril     REACTION: cough    Past Medical History  Diagnosis Date  . Allergic rhinitis   . Anemia     NOS  . CAD (coronary artery disease)   . DM II (diabetes mellitus, type II), controlled   . HLD (hyperlipidemia)   . HTN (hypertension)   . OA (osteoarthritis)   . BPH (benign prostatic hypertrophy)   . Diverticulosis of colon     with repeated bleeds  . Atrial fibrillation or flutter 11/09  . Depression   . CHF (congestive heart failure)   . Thyroid disease   . GI bleed 2013    Non determined    Past Surgical History  Procedure  Date  . Tonsillectomy and adenoidectomy 1933  . Coronary artery bypass graft 1995  . Coronary artery bypass graft 1999    Redo  . Hemorrhoid surgery 1974  . Transurethral resection of prostate 1993  . Nasal stenosis repair 1996  . Aortic valve replacement 1999    pig valve  . Total shoulder replacement 1992    right  . Retinal vitrectomy     Left  . Cataract extraction 1999  . Carcinoma     head    Family History  Problem Relation Age of Onset  . Cancer Mother     (male)  . Parkinsonism Maternal Grandfather   . Mental retardation Son   . Colon cancer Neg Hx     History   Social History  . Marital Status: Married    Spouse Name: N/A    Number of Children: 5  . Years of Education: N/A   Occupational History  . 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 Smoking status: Former Smoker    Quit date: 07/18/1950  . Smokeless tobacco: Never Used  . Alcohol Use: Yes     one daily   . Drug Use: No  . Sexually Active: Not on file   Other Topics Concern  . Not on file   Social History Narrative   Has living willWife  has health care POA---then son, NickRequests DNR and order doneWould not want a feeding tubeDaily caffeine       Review of Systems No fever Has felt okay    Objective:   Physical Exam  Constitutional: He appears well-developed and well-nourished. No distress.  Skin:       Mild redness on convex buttocks (middle) Small ulcer with stable eschar on the left buttock No sig warmth or tenderness  Psychiatric: He has a normal mood and affect. His behavior is normal.          Assessment & Plan:

## 2012-04-12 NOTE — Assessment & Plan Note (Addendum)
Limited abduction and external rotation especially Some relief with capsaicin and other OTC creams  Will give Rx for tramadol--may benefit from it especially at night

## 2012-04-18 ENCOUNTER — Ambulatory Visit (INDEPENDENT_AMBULATORY_CARE_PROVIDER_SITE_OTHER): Payer: Medicare Other | Admitting: Internal Medicine

## 2012-04-18 DIAGNOSIS — Z5181 Encounter for therapeutic drug level monitoring: Secondary | ICD-10-CM

## 2012-04-18 DIAGNOSIS — Z7901 Long term (current) use of anticoagulants: Secondary | ICD-10-CM

## 2012-04-18 DIAGNOSIS — I4891 Unspecified atrial fibrillation: Secondary | ICD-10-CM

## 2012-04-18 LAB — POCT INR: INR: 2.1

## 2012-04-18 NOTE — Patient Instructions (Addendum)
continue 2.5 mg daily, except  1.25MG  SAT recheck 4 weeks

## 2012-04-20 ENCOUNTER — Telehealth: Payer: Self-pay | Admitting: *Deleted

## 2012-04-20 NOTE — Telephone Encounter (Signed)
Spoke with patient's wife and reminded of lab due next week

## 2012-04-20 NOTE — Telephone Encounter (Signed)
Message copied by Daphine Deutscher on Fri Apr 20, 2012  8:57 AM ------      Message from: Daphine Deutscher      Created: Tue Jan 17, 2012  8:47 AM       Call and remind patient CBC due on 04/23/12 for DB

## 2012-05-02 ENCOUNTER — Other Ambulatory Visit: Payer: Self-pay | Admitting: Internal Medicine

## 2012-05-04 ENCOUNTER — Other Ambulatory Visit: Payer: Self-pay | Admitting: Internal Medicine

## 2012-05-04 MED ORDER — GLUCOSE BLOOD VI DISK: DISK | Status: DC

## 2012-05-04 NOTE — Telephone Encounter (Signed)
Patient needs his diabetic test strips filled. He states the pharmacy called [place] 2 days ago and they need [place] to call them. Pharmacy: [facility] He states the pharmacy needs a certain number on the rx to refill it.  Please call the pharmacy to find out what they need to get his test strips filled.

## 2012-05-04 NOTE — Telephone Encounter (Signed)
Spoke with patient and advised I spoke with pharmacist and gave them his diag code 250.00 for diabetes

## 2012-05-16 ENCOUNTER — Ambulatory Visit (INDEPENDENT_AMBULATORY_CARE_PROVIDER_SITE_OTHER): Payer: Medicare Other | Admitting: Internal Medicine

## 2012-05-16 DIAGNOSIS — Z7901 Long term (current) use of anticoagulants: Secondary | ICD-10-CM

## 2012-05-16 DIAGNOSIS — I4891 Unspecified atrial fibrillation: Secondary | ICD-10-CM

## 2012-05-16 DIAGNOSIS — Z5181 Encounter for therapeutic drug level monitoring: Secondary | ICD-10-CM

## 2012-05-16 LAB — POCT INR: INR: 1.9

## 2012-05-16 NOTE — Patient Instructions (Addendum)
continue 2.5 mg daily, except  1.25MG SAT recheck 4 weeks 

## 2012-05-20 ENCOUNTER — Other Ambulatory Visit: Payer: Self-pay | Admitting: Internal Medicine

## 2012-05-30 ENCOUNTER — Ambulatory Visit (INDEPENDENT_AMBULATORY_CARE_PROVIDER_SITE_OTHER): Payer: Medicare Other | Admitting: Internal Medicine

## 2012-05-30 ENCOUNTER — Encounter: Payer: Self-pay | Admitting: Internal Medicine

## 2012-05-30 VITALS — BP 120/62 | HR 62 | Temp 97.4°F | Ht 67.5 in | Wt 137.5 lb

## 2012-05-30 DIAGNOSIS — M25519 Pain in unspecified shoulder: Secondary | ICD-10-CM

## 2012-05-30 DIAGNOSIS — E119 Type 2 diabetes mellitus without complications: Secondary | ICD-10-CM

## 2012-05-30 DIAGNOSIS — F329 Major depressive disorder, single episode, unspecified: Secondary | ICD-10-CM

## 2012-05-30 DIAGNOSIS — E039 Hypothyroidism, unspecified: Secondary | ICD-10-CM

## 2012-05-30 DIAGNOSIS — F3289 Other specified depressive episodes: Secondary | ICD-10-CM

## 2012-05-30 DIAGNOSIS — D509 Iron deficiency anemia, unspecified: Secondary | ICD-10-CM

## 2012-05-30 DIAGNOSIS — M25511 Pain in right shoulder: Secondary | ICD-10-CM

## 2012-05-30 DIAGNOSIS — Z7901 Long term (current) use of anticoagulants: Secondary | ICD-10-CM

## 2012-05-30 NOTE — Progress Notes (Signed)
Patient ID: Dylan Perkins, male   DOB: May 07, 1918, 78 y.o.   MRN: [id number]  Patient Active Problem List  Diagnosis  . UNSPECIFIED HYPOTHYROIDISM  . DIABETES MELLITUS, TYPE II  . HYPERLIPIDEMIA  . Anemia, iron deficiency  . DEPRESSION  . HYPERTENSION  . CORONARY ARTERY DISEASE  . ATRIAL FIBRILLATION  . ATRIAL FLUTTER  . ALLERGIC RHINITIS  . DIVERTICULOSIS, COLON  . BENIGN PROSTATIC HYPERTROPHY  . ACTINIC KERATOSIS  . Osteoarthritis, shoulder  . BACK PAIN, LUMBAR  . EDEMA  . CAD, ARTERY BYPASS GRAFT  . Pulmonary HTN  . GI bleed  . Chronic diastolic CHF (congestive heart failure)  . Pleural effusion due to CHF (congestive heart failure)  . Pressure ulcer, buttock(707.05)  . Long term (current) use of anticoagulants    Subjective:  CC:   Chief Complaint  Patient presents with  . Establish Care    HPI:   Dylan Perkins is a 78 y.o. male who presents as a new patient to establish primary care with the chief complaint of  "I have many problems."  He is a 78 yr old male previously followed by Tillman Abide,  Lives at [facility],  Reason for change in PCP unclear.   1) Right shoulder pain,  Chronic, since his shoulder replacement surgery in 2002He has adequate ROM but lacks strength ,  History of persistent pain , wakes him up at midnight. Uses a heating pad for an hour which allows him to go back to sleep.    Does not tolerate narcotics and even tylenol gives him a hangover.  Has tried celebrex but it bothers his stomach.      2) History of rectal bleeding, diverticulosis by prior colonoscopy.    Per patient  6 months ago had one episode of black stools which was confirmed heme positive , but no colonoscopy done because of his age and the swift resolution (lasted < 48 hours) before it resolved spontaneously.  Coumadin level has been adjusted per records .   3) Progressive vision loss. First the right  Eye, then the left.  Retinal hemorrhages.  Eye doctor : [facility]eller , has done  laser surgery and 15 injections.  Some restoration of vision. Vision is better in the afternoons, than in the morning. .  Still drives his car. No recent accidents.   4) recurrent skin cancers appearing on Legs, lip and scalp, dermatologist is in [place] Dr. [facility] for years has done his Moh's procedures.    5) Diabetes mellitus managed with glipizide.  only taking it once daily before breakfast,  Rare hypoglycemic events.   6) wt loss of 17 lbs since June 2012 which he attributes to use of diuretics and resolution of LE edema.    Past Medical History  Diagnosis Date  . Allergic rhinitis   . Anemia     NOS  . CAD (coronary artery disease)   . DM II (diabetes mellitus, type II), controlled   . HLD (hyperlipidemia)   . HTN (hypertension)   . OA (osteoarthritis)   . BPH (benign prostatic hypertrophy)   . Diverticulosis of colon     with repeated bleeds  . Atrial fibrillation or flutter 11/09  . Depression   . CHF (congestive heart failure)   . Thyroid disease   . GI bleed 2013    Non determined    Past Surgical History  Procedure Date  . Tonsillectomy and adenoidectomy 1933  . Coronary artery bypass graft 1995  .  Coronary artery bypass graft 1999    Redo  . Hemorrhoid surgery 1974  . Transurethral resection of prostate 1993  . Nasal stenosis repair 1996  . Aortic valve replacement 1999    pig valve  . Total shoulder replacement 1992    right  . Retinal vitrectomy     Left  . Cataract extraction 1999  . Carcinoma     head    Family History  Problem Relation Age of Onset  . Cancer Mother     (male)  . Parkinsonism Maternal Grandfather   . Mental retardation Son   . Colon cancer Neg Hx     History   Social History  . Marital Status: Married    Spouse Name: N/A    Number of Children: 5  . Years of Education: N/A   Occupational History  . 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 Smoking status: Former  Smoker    Quit date: 07/18/1950  . Smokeless tobacco: Never Used  . Alcohol Use: Yes     Comment: one daily   . Drug Use: No  . Sexually Active: Not on file   Other Topics Concern  . Not on file   Social History Narrative   Has living willWife has health care POA---then son, NickRequests DNR and order doneWould not want a feeding tubeDaily caffeine    Allergies  Allergen Reactions  . Lisinopril     REACTION: cough    Review of Systems: Review of Systems  Constitutional: Positive for malaise/fatigue. Negative for fever.  HENT: Positive for nosebleeds. Negative for sore throat and tinnitus.   Eyes: Positive for blurred vision. Negative for pain.  Respiratory: Negative for cough and sputum production.   Cardiovascular: Negative for chest pain and leg swelling.  Gastrointestinal: Negative for heartburn, abdominal pain and blood in stool.  Genitourinary: Negative for dysuria and flank pain.  Musculoskeletal: Positive for back pain and joint pain. Negative for falls.  Neurological: Positive for focal weakness and weakness. Negative for dizziness.  Endo/Heme/Allergies: Negative for environmental allergies and polydipsia. Bruises/bleeds easily.  Psychiatric/Behavioral: Positive for depression. Negative for suicidal ideas. The patient is not nervous/anxious and does not have insomnia.     Objective:  BP 120/62  Pulse 62  Temp 97.4 F (36.3 C) (Oral)  Ht 5' 7.5" (1.715 m)  Wt 137 lb 8 oz (62.37 kg)  BMI 21.22 kg/m2  SpO2 97%  General appearance: alert, cooperative and appears stated age Ears: normal TM's and external ear canals both ears Throat: lips, mucosa, and tongue normal; teeth and gums normal Neck: no adenopathy, no carotid bruit, supple, symmetrical, trachea midline and thyroid not enlarged, symmetric, no tenderness/mass/nodules Back: symmetric, no curvature. ROM normal. No CVA tenderness. Lungs: clear to auscultation bilaterally Heart: regular rate and rhythm, S1, S2  normal, no murmur, click, rub or gallop Abdomen: soft, non-tender; bowel sounds normal; no masses,  no organomegaly Pulses: 2+ and symmetric Skin: Skin color, texture, turgor normal. No rashes or lesions Lymph nodes: Cervical, supraclavicular, and axillary nodes normal.  Assessment and Plan:  UNSPECIFIED HYPOTHYROIDISM His TSh has not been checked in over a year.  He takes low dose synthroid at 25 mcg daily.  Return for labs   Anemia, iron deficiency hgb has been unchanged with oral iron therapy.  Will check EPO and iron levels with next lab draw  DIABETES MELLITUS, TYPE II His last a1c was 7.3 in July.  Given his age I will not  change medications at this time unless his a1c is 8.0 or higher   DEPRESSION Appears dysthymic, aggravated by pain and comorbidities..  Discussed acupuncture therapy for relief of pain given his intolerance to.all medications   Updated Medication List Outpatient Encounter Prescriptions as of 05/30/2012  Medication Sig Dispense Refill  . alum & mag hydroxide-simeth (MYLANTA) 200-200-20 MG/5ML suspension Take by mouth as needed.        Marland Kitchen aspirin 81 MG tablet Take 81 mg by mouth every other day.        Marland Kitchen atenolol (TENORMIN) 25 MG tablet Take 1 tablet (25 mg total) by mouth daily.  90 tablet  6  . calcium carbonate (TUMS) 500 MG chewable tablet Chew 1 tablet by mouth daily as needed.       . docusate sodium (COLACE) 100 MG capsule Take 100 mg by mouth 2 (two) times daily.      . furosemide (LASIX) 80 MG tablet Take 40 mg by mouth 2 (two) times daily.      Marland Kitchen glipiZIDE (GLUCOTROL) 10 MG tablet Take 10 mg by mouth 2 (two) times daily before a meal.       . Glucose Blood (BAYER BREEZE 2 TEST) DISK TEST UP TO 4 TIMES PER DAY, dx: 250.00  100 each  6  . ipratropium (ATROVENT) 0.03 % nasal spray Place 2 sprays into the nose every 12 (twelve) hours as needed.  30 mL  1  . isosorbide mononitrate (IMDUR) 30 MG 24 hr tablet TAKE 1 TABLET (30 MG TOTAL) BY MOUTH DAILY.  30  tablet  11  . levothyroxine (SYNTHROID, LEVOTHROID) 25 MCG tablet Take 25 mcg by mouth daily.       Marland Kitchen LIDODERM 5 % as needed.       Marland Kitchen losartan (COZAAR) 25 MG tablet Take 1 tablet (25 mg total) by mouth daily.  90 tablet  4  . metolazone (ZAROXOLYN) 5 MG tablet Take 1 tablet (5 mg total) by mouth daily.  30 tablet  6  . Multiple Vitamin (MULTIVITAMIN) tablet Take 1 tablet by mouth daily after breakfast.        . ranitidine (ZANTAC) 150 MG capsule Take 150 mg by mouth daily as needed.       . traMADol (ULTRAM) 50 MG tablet Take 1 tablet (50 mg total) by mouth at bedtime as needed for pain.  30 tablet  1  . warfarin (COUMADIN) 2.5 MG tablet Take 1 tablet (2.5 mg total) by mouth daily.  30 tablet  11     Orders Placed This Encounter  Procedures  . Protime-INR  . Erythropoietin  . Ferritin  . Iron and TIBC  . Hemoglobin A1c  . TSH  . Comprehensive metabolic panel  . Ambulatory referral to Chiropractic    No Follow-up on file.

## 2012-05-30 NOTE — Patient Instructions (Addendum)
I will refer you to Dr. Patrici Ranks to try acupuncture for your right shoulder pain.  Return at the end of the month for nonfasting labs and coumadin check.

## 2012-06-02 DIAGNOSIS — Z7901 Long term (current) use of anticoagulants: Secondary | ICD-10-CM | POA: Insufficient documentation

## 2012-06-02 NOTE — Assessment & Plan Note (Signed)
hgb has been unchanged with oral iron therapy.  Will check EPO and iron levels with next lab draw

## 2012-06-02 NOTE — Assessment & Plan Note (Signed)
His TSh has not been checked in over a year.  He takes low dose synthroid at 25 mcg daily.  Return for labs

## 2012-06-02 NOTE — Assessment & Plan Note (Signed)
His last a1c was 7.3 in July.  Given his age I will not change medications at this time unless his a1c is 8.0 or higher

## 2012-06-02 NOTE — Assessment & Plan Note (Signed)
Appears dysthymic, aggravated by pain and comorbidities..  Discussed acupuncture therapy for relief of pain given his intolerance to.all medications

## 2012-06-11 ENCOUNTER — Ambulatory Visit (INDEPENDENT_AMBULATORY_CARE_PROVIDER_SITE_OTHER): Payer: Medicare Other | Admitting: Cardiovascular Disease

## 2012-06-11 ENCOUNTER — Encounter: Payer: Self-pay | Admitting: Cardiovascular Disease

## 2012-06-11 ENCOUNTER — Other Ambulatory Visit (INDEPENDENT_AMBULATORY_CARE_PROVIDER_SITE_OTHER): Payer: Medicare Other

## 2012-06-11 VITALS — BP 109/54 | HR 75 | Ht 67.5 in | Wt 140.2 lb

## 2012-06-11 DIAGNOSIS — I2789 Other specified pulmonary heart diseases: Secondary | ICD-10-CM

## 2012-06-11 DIAGNOSIS — I509 Heart failure, unspecified: Secondary | ICD-10-CM

## 2012-06-11 DIAGNOSIS — I272 Pulmonary hypertension, unspecified: Secondary | ICD-10-CM

## 2012-06-11 DIAGNOSIS — I5032 Chronic diastolic (congestive) heart failure: Secondary | ICD-10-CM

## 2012-06-11 DIAGNOSIS — E119 Type 2 diabetes mellitus without complications: Secondary | ICD-10-CM

## 2012-06-11 DIAGNOSIS — I25810 Atherosclerosis of coronary artery bypass graft(s) without angina pectoris: Secondary | ICD-10-CM

## 2012-06-11 DIAGNOSIS — I4891 Unspecified atrial fibrillation: Secondary | ICD-10-CM

## 2012-06-11 DIAGNOSIS — D649 Anemia, unspecified: Secondary | ICD-10-CM

## 2012-06-11 DIAGNOSIS — I10 Essential (primary) hypertension: Secondary | ICD-10-CM

## 2012-06-11 DIAGNOSIS — Z7901 Long term (current) use of anticoagulants: Secondary | ICD-10-CM

## 2012-06-11 DIAGNOSIS — D509 Iron deficiency anemia, unspecified: Secondary | ICD-10-CM

## 2012-06-11 DIAGNOSIS — E039 Hypothyroidism, unspecified: Secondary | ICD-10-CM

## 2012-06-11 DIAGNOSIS — K922 Gastrointestinal hemorrhage, unspecified: Secondary | ICD-10-CM

## 2012-06-11 DIAGNOSIS — Z5181 Encounter for therapeutic drug level monitoring: Secondary | ICD-10-CM

## 2012-06-11 DIAGNOSIS — R609 Edema, unspecified: Secondary | ICD-10-CM

## 2012-06-11 LAB — CBC WITH DIFFERENTIAL/PLATELET
Basophils Absolute: 0 10*3/uL (ref 0.0–0.1)
Eosinophils Absolute: 0 10*3/uL (ref 0.0–0.7)
HCT: 34.7 % — ABNORMAL LOW (ref 39.0–52.0)
Lymphs Abs: 2 10*3/uL (ref 0.7–4.0)
MCHC: 32.8 g/dL (ref 30.0–36.0)
MCV: 99.2 fl (ref 78.0–100.0)
Monocytes Absolute: 0.5 10*3/uL (ref 0.1–1.0)
Neutrophils Relative %: 58.4 % (ref 43.0–77.0)
Platelets: 139 10*3/uL — ABNORMAL LOW (ref 150.0–400.0)
RDW: 14.8 % — ABNORMAL HIGH (ref 11.5–14.6)
WBC: 6.1 10*3/uL (ref 4.5–10.5)

## 2012-06-11 LAB — COMPREHENSIVE METABOLIC PANEL
ALT: 38 U/L (ref 0–53)
Alkaline Phosphatase: 128 U/L — ABNORMAL HIGH (ref 39–117)
CO2: 33 mEq/L — ABNORMAL HIGH (ref 19–32)
Creatinine, Ser: 1.6 mg/dL — ABNORMAL HIGH (ref 0.4–1.5)
GFR: 44.76 mL/min — ABNORMAL LOW (ref >60.00)
Total Bilirubin: 0.6 mg/dL (ref 0.3–1.2)

## 2012-06-11 LAB — TSH: TSH: 17.67 u[IU]/mL — ABNORMAL HIGH (ref 0.35–5.50)

## 2012-06-11 LAB — PROTIME-INR
INR: 1.8 ratio — ABNORMAL HIGH (ref 0.8–1.0)
Prothrombin Time: 19.1 s — ABNORMAL HIGH (ref 10.2–12.4)

## 2012-06-11 LAB — HEMOGLOBIN A1C: Hgb A1c MFr Bld: 7.6 % — ABNORMAL HIGH (ref 4.6–6.5)

## 2012-06-11 NOTE — Assessment & Plan Note (Addendum)
I am very impressed with his current stable weight at 135 pounds. Edema has significantly improved. We have suggested he continue his current Lasix regimen twice a day with metolazone 3 times per week. Lab work is pending.

## 2012-06-11 NOTE — Assessment & Plan Note (Signed)
Blood pressure is well controlled on today's visit. No changes made to the medications. 

## 2012-06-11 NOTE — Assessment & Plan Note (Signed)
Heart rate is relatively stable. On warfarin.

## 2012-06-11 NOTE — Assessment & Plan Note (Signed)
Trace edema only. Much better than previous visits.

## 2012-06-11 NOTE — Progress Notes (Signed)
Patient ID: Dylan Perkins, male    DOB: September 30, 1917, 78 y.o.   MRN: [id number]  HPI Comments: 78 yo WM with history of DM, HTN, hyperlipidemia, CAD s/p CABG and redo, s/p porcine AVR with moderate residual stenosis, and  diagnosis of atrial fibrillation in November 2009 on coumadin therapy,    intolerance of statins, who presents for routine followup. H/o clot to his right eye " that has caused significant vision loss on the right.   Goes to exercise class, 3x/week, no aerobic, mainly stretching Previous significant lower extremity edema. Much improved on current diuretic regimen. He has been monitoring his weight. Weight in September was 140 pounds, now 135 pounds and stable. He feels much better, no significant shortness of breath. He sleeps well.  He had a  echocardiogram for worsening edema on 07/28/2011 Echocardiogram showed severe pulmonary hypertension, moderate aortic valve stenosis of his bioprosthetic valve, mildly dilated left atrium  Chest x-ray previously showed right-sided pleural effusion, worse on the right than the left.  Clinically, effusions have resolved on today's visit   EKG shows atrial fibrillation with ventricular rate 74 beats per minute, intraventricular conduction delay, old anteroseptal infarct, left axis deviation/LAFB      Outpatient Encounter Prescriptions as of 06/11/2012  Medication Sig Dispense Refill  . alum & mag hydroxide-simeth (MYLANTA) 200-200-20 MG/5ML suspension Take by mouth as needed.        Marland Kitchen aspirin 81 MG tablet Take 81 mg by mouth every other day.        Marland Kitchen atenolol (TENORMIN) 25 MG tablet Take 1 tablet (25 mg total) by mouth daily.  90 tablet  6  . calcium carbonate (TUMS) 500 MG chewable tablet Chew 1 tablet by mouth daily as needed.       . docusate sodium (COLACE) 100 MG capsule Take 100 mg by mouth 2 (two) times daily.      . furosemide (LASIX) 80 MG tablet Take 40 mg by mouth 2 (two) times daily.      Marland Kitchen glipiZIDE (GLUCOTROL) 10 MG tablet  Take 10 mg by mouth 2 (two) times daily before a meal.       . Glucose Blood (BAYER BREEZE 2 TEST) DISK TEST UP TO 4 TIMES PER DAY, dx: 250.00  100 each  6  . ipratropium (ATROVENT) 0.03 % nasal spray Place 2 sprays into the nose every 12 (twelve) hours as needed.  30 mL  1  . isosorbide mononitrate (IMDUR) 30 MG 24 hr tablet TAKE 1 TABLET (30 MG TOTAL) BY MOUTH DAILY.  30 tablet  11  . levothyroxine (SYNTHROID, LEVOTHROID) 25 MCG tablet Take 25 mcg by mouth daily.       Marland Kitchen LIDODERM 5 % as needed.       Marland Kitchen losartan (COZAAR) 25 MG tablet Take 1 tablet (25 mg total) by mouth daily.  90 tablet  4  . metolazone (ZAROXOLYN) 5 MG tablet Take 1 tablet (5 mg total) by mouth daily.  30 tablet  6  . Multiple Vitamin (MULTIVITAMIN) tablet Take 1 tablet by mouth daily after breakfast.        . ranitidine (ZANTAC) 150 MG capsule Take 150 mg by mouth daily as needed.       . traMADol (ULTRAM) 50 MG tablet Take 1 tablet (50 mg total) by mouth at bedtime as needed for pain.  30 tablet  1  . warfarin (COUMADIN) 2.5 MG tablet Take 1 tablet (2.5 mg total) by mouth daily.  30 tablet  11    Review of Systems  Constitutional: Negative.   HENT: Negative.   Eyes: Negative.   Cardiovascular: Positive for leg swelling.  Gastrointestinal: Negative.   Musculoskeletal: Positive for gait problem.  Skin: Negative.   Neurological: Negative.   Hematological: Negative.   Psychiatric/Behavioral: Positive for sleep disturbance.  All other systems reviewed and are negative.    BP 109/54  Pulse 80  Ht 5' 7.5" (1.715 m)  Wt 140 lb 4 oz (63.617 kg)  BMI 21.64 kg/m2  Physical Exam  Nursing note and vitals reviewed. Constitutional: He is oriented to person, place, and time. He appears well-developed and well-nourished.  HENT:  Head: Normocephalic.  Nose: Nose normal.  Mouth/Throat: Oropharynx is clear and moist.  Eyes: Conjunctivae normal are normal. Pupils are equal, round, and reactive to light.  Neck: Normal range  of motion. Neck supple. No JVD present.  Cardiovascular: Normal rate, S1 normal, S2 normal and intact distal pulses.  An irregularly irregular rhythm present. Exam reveals no gallop and no friction rub.   Murmur heard.  Crescendo systolic murmur is present with a grade of 2/6  Pulmonary/Chest: Effort normal. No respiratory distress. He has decreased breath sounds in the right lower field. He has no wheezes. He has no rales. He exhibits no tenderness.  Abdominal: Soft. Bowel sounds are normal. He exhibits no distension. There is no tenderness.  Musculoskeletal: Normal range of motion. He exhibits no edema and no tenderness.  Lymphadenopathy:    He has no cervical adenopathy.  Neurological: He is alert and oriented to person, place, and time. Coordination normal.  Skin: Skin is warm and dry. No rash noted. No erythema.  Psychiatric: He has a normal mood and affect. His behavior is normal. Judgment and thought content normal.           Assessment and Plan

## 2012-06-11 NOTE — Assessment & Plan Note (Signed)
Currently with no symptoms of angina. No further workup at this time. Continue current medication regimen. 

## 2012-06-11 NOTE — Patient Instructions (Addendum)
You are doing well. No medication changes were made.  Please call us if you have new issues that need to be addressed before your next appt.  Your physician wants you to follow-up in: 6 months.  You will receive a reminder letter in the mail two months in advance. If you don't receive a letter, please call our office to schedule the follow-up appointment.   

## 2012-06-12 LAB — IRON AND TIBC
%SAT: 23 % (ref 20–55)
Iron: 97 ug/dL (ref 42–165)

## 2012-06-12 MED ORDER — LEVOTHYROXINE SODIUM 50 MCG PO TABS: 50.0000 ug | ORAL_TABLET | Freq: Every day | ORAL | Status: DC

## 2012-06-12 MED ORDER — WARFARIN SODIUM 2.5 MG PO TABS: 2.5000 mg | ORAL_TABLET | Freq: Every day | ORAL | Status: DC

## 2012-06-12 MED ORDER — WARFARIN SODIUM 1 MG PO TABS: 1.0000 mg | ORAL_TABLET | ORAL | Status: DC

## 2012-06-12 NOTE — Addendum Note (Signed)
Addended by: Sherlene Shams on: 06/12/2012 05:17 PM   Modules accepted: Orders

## 2012-06-18 ENCOUNTER — Emergency Department: Payer: Self-pay | Admitting: Emergency Medicine

## 2012-06-18 LAB — CBC
HCT: 34.7 % — ABNORMAL LOW (ref 40.0–52.0)
HGB: 11.6 g/dL — ABNORMAL LOW (ref 13.0–18.0)
MCH: 33.3 pg (ref 26.0–34.0)
Platelet: 141 10*3/uL — ABNORMAL LOW (ref 150–440)
RBC: 3.49 10*6/uL — ABNORMAL LOW (ref 4.40–5.90)

## 2012-06-18 LAB — PROTIME-INR: INR: 1.8

## 2012-06-18 LAB — APTT: Activated PTT: 40.5 secs — ABNORMAL HIGH (ref 23.6–35.9)

## 2012-06-20 ENCOUNTER — Ambulatory Visit (INDEPENDENT_AMBULATORY_CARE_PROVIDER_SITE_OTHER): Payer: Medicare Other | Admitting: Adult Health

## 2012-06-20 ENCOUNTER — Encounter: Payer: Self-pay | Admitting: Adult Health

## 2012-06-20 VITALS — BP 120/78 | HR 73 | Temp 98.7°F | Ht 67.0 in | Wt 145.0 lb

## 2012-06-20 DIAGNOSIS — E119 Type 2 diabetes mellitus without complications: Secondary | ICD-10-CM

## 2012-06-20 DIAGNOSIS — E039 Hypothyroidism, unspecified: Secondary | ICD-10-CM

## 2012-06-20 DIAGNOSIS — Z7901 Long term (current) use of anticoagulants: Secondary | ICD-10-CM

## 2012-06-20 DIAGNOSIS — R040 Epistaxis: Secondary | ICD-10-CM | POA: Insufficient documentation

## 2012-06-20 LAB — PROTIME-INR: INR: 1.7 ratio — ABNORMAL HIGH (ref 0.8–1.0)

## 2012-06-20 NOTE — Patient Instructions (Addendum)
Take glipizide 10mg  tab before breakfast and one before dinner. Monitor blood sugar every morning as you have been doing. If blood glucose gets too low (below 80) then cut back to 1/2 tablet at dinner.   F/U Dr. Jenne Campus on Monday as scheduled for the nose bleed.  We will call you if your coumadin level needs adjusting.

## 2012-06-20 NOTE — Progress Notes (Signed)
Subjective:    Patient ID: Dylan Perkins, male    DOB: [birth year], 78 y.o.   MRN: [id number]  HPI  1. DM II - 78 y/o gentleman with multiple medical problems including DM, hypothyroidism, HTN, HLD, afib on coumadin tx, pulmonary HTN, valvular HD who presents to clinic with c/o elevated FBS (Tuesday & today).  2. Epistaxis -  Also reports episode of epistaxis on Monday requiring visit to ED. Pt is s/p right nare packing by Dr. Jenne Campus yesterday.  3. Chronic Anticoagulation - Reports taking coumadin 2.5 mg x 6 days and 1.5 mg x 1 day.  4. Hypothyroidism - Recent increase in synthroid   Current Outpatient Prescriptions on File Prior to Visit  Medication Sig Dispense Refill  . alum & mag hydroxide-simeth (MYLANTA) 200-200-20 MG/5ML suspension Take by mouth as needed.        Marland Kitchen aspirin 81 MG tablet Take 81 mg by mouth every other day.        Marland Kitchen atenolol (TENORMIN) 25 MG tablet Take 1 tablet (25 mg total) by mouth daily.  90 tablet  6  . calcium carbonate (TUMS) 500 MG chewable tablet Chew 1 tablet by mouth daily as needed.       . docusate sodium (COLACE) 100 MG capsule Take 100 mg by mouth 2 (two) times daily.      . furosemide (LASIX) 80 MG tablet Take 40 mg by mouth 2 (two) times daily.      Marland Kitchen glipiZIDE (GLUCOTROL) 10 MG tablet Take 10 mg by mouth 2 (two) times daily before a meal.       . Glucose Blood (BAYER BREEZE 2 TEST) DISK TEST UP TO 4 TIMES PER DAY, dx: 250.00  100 each  6  . ipratropium (ATROVENT) 0.03 % nasal spray Place 2 sprays into the nose every 12 (twelve) hours as needed.  30 mL  1  . isosorbide mononitrate (IMDUR) 30 MG 24 hr tablet TAKE 1 TABLET (30 MG TOTAL) BY MOUTH DAILY.  30 tablet  11  . levothyroxine (SYNTHROID, LEVOTHROID) 50 MCG tablet Take 1 tablet (50 mcg total) by mouth daily.  30 tablet  6  . LIDODERM 5 % as needed.       Marland Kitchen losartan (COZAAR) 25 MG tablet Take 1 tablet (25 mg total) by mouth daily.  90 tablet  4  . metolazone (ZAROXOLYN) 5 MG tablet Take 5 mg by  mouth daily. Three times a week      . Multiple Vitamin (MULTIVITAMIN) tablet Take 1 tablet by mouth daily after breakfast.        . ranitidine (ZANTAC) 150 MG capsule Take 150 mg by mouth daily as needed.       . traMADol (ULTRAM) 50 MG tablet Take 1 tablet (50 mg total) by mouth at bedtime as needed for pain.  30 tablet  1  . warfarin (COUMADIN) 1 MG tablet Take 1 tablet (1 mg total) by mouth as directed.  10 tablet  3  . warfarin (COUMADIN) 2.5 MG tablet Take 1 tablet (2.5 mg total) by mouth daily. 2.5 mg daily except Sundays and Thursdays take 3.5 mg  30 tablet  2     Review of Systems  Constitutional: Negative for fever, appetite change, fatigue and unexpected weight change.  HENT: Positive for nosebleeds.   Eyes: Positive for visual disturbance. Negative for pain.  Respiratory: Negative for cough, chest tightness and shortness of breath.   Cardiovascular: Positive for leg swelling. Negative for chest pain.  Gastrointestinal: Positive for constipation.  Genitourinary: Positive for frequency.  Neurological: Negative for dizziness, syncope, weakness and headaches.  Psychiatric/Behavioral: Negative.     BP 120/78  Pulse 73  Temp 98.7 F (37.1 C) (Oral)  Ht 5\' 7"  (1.702 m)  Wt 145 lb (65.772 kg)  BMI 22.71 kg/m2  SpO2 99%     Objective:   Physical Exam  Constitutional: Vital signs are normal. He is cooperative. No distress.  HENT:       Right nostril with packing in place  Cardiovascular: Normal rate.  An irregular rhythm present.  Murmur heard.  Systolic murmur is present       Aortic stenosis  Pulmonary/Chest: Effort normal and breath sounds normal. He has no wheezes. He has no rhonchi. He has no rales.  Abdominal: Soft. Bowel sounds are normal.  Neurological: He is alert.  Skin: Ecchymosis noted.     Psychiatric: He has a normal mood and affect. His speech is normal and behavior is normal. Judgment and thought content normal. Cognition and memory are normal.           Assessment & Plan:

## 2012-06-20 NOTE — Assessment & Plan Note (Signed)
Status post visit to the ED this past Monday (06/18/12) for nose bleed. He was referred to ENT. Saw Dr. Jenne Campus on Tuesday. Had his nose packed. Started hydrocodone-acetaminophen 5-500mg  1 tablet q 6 hrs and cephalexin 500mg  three x day (20 cap). Follow up with Dr. Jenne Campus on Monday for removal of packing. Will check PT/INR today since started on Cephalexin.

## 2012-06-20 NOTE — Assessment & Plan Note (Addendum)
Patient was started on Cephalexin in the ED on Monday. Will check PT/INR today. Patient was notified on 06/12/12 to increase coumadin dose to 2.5 mg daily except Thursday & Sunday which he should take 3.5 mg. Patient did not increase this dose. He is taking 2.5 mg x 6 days and 1.5 mg x 1 day.

## 2012-06-20 NOTE — Assessment & Plan Note (Addendum)
Hyperglycemia -  AM fasting glucose has been well controlled running in 80s - low 100s. Per patient, Blood glucose elevated Tuesday morning (203) and Wednesday (234). He has only been taking his glipizide in the morning. Instructed patient to also take before dinner. Continue to monitor blood glucose and report BG < 80. Patient has not had any significant hypoglycemic events on glipizide. Recently had his eye injected which could possibly contribute to elevated BG.

## 2012-06-20 NOTE — Assessment & Plan Note (Addendum)
Synthroid recently increased from 25 mcg to 50 mcg. Tolerating well. Repeat TSH in 6-8 weeks.

## 2012-07-05 ENCOUNTER — Other Ambulatory Visit: Payer: Self-pay | Admitting: *Deleted

## 2012-07-05 DIAGNOSIS — Z7901 Long term (current) use of anticoagulants: Secondary | ICD-10-CM

## 2012-07-09 ENCOUNTER — Other Ambulatory Visit (INDEPENDENT_AMBULATORY_CARE_PROVIDER_SITE_OTHER): Payer: Medicare Other

## 2012-07-09 DIAGNOSIS — Z7901 Long term (current) use of anticoagulants: Secondary | ICD-10-CM

## 2012-07-09 LAB — PROTIME-INR: INR: 2.2 ratio — ABNORMAL HIGH (ref 0.8–1.0)

## 2012-07-20 IMAGING — US US EXTREM LOW VENOUS*L*
1 series · 14 of 24 positions shown · non-contrast
Comparison: none

REASON FOR EXAM: tender left calf
COMMENTS:

[Series 1: us extrem low venous*left* · 14 of 25 slices shown]
[im 1/25]
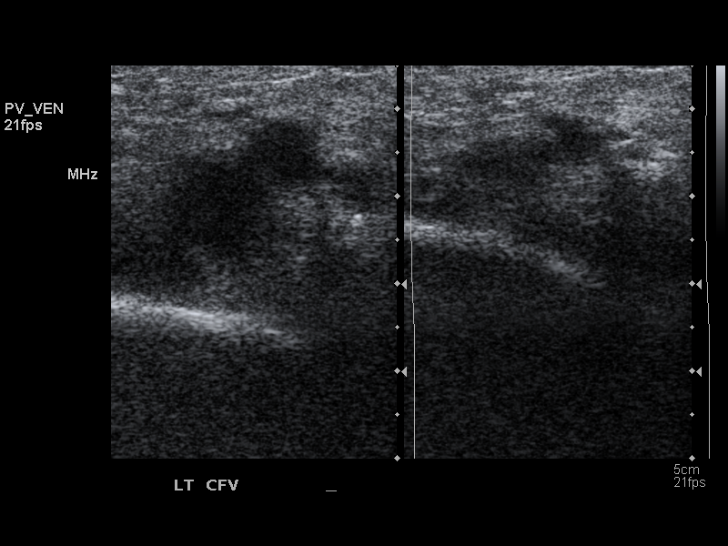
[im 3/25]
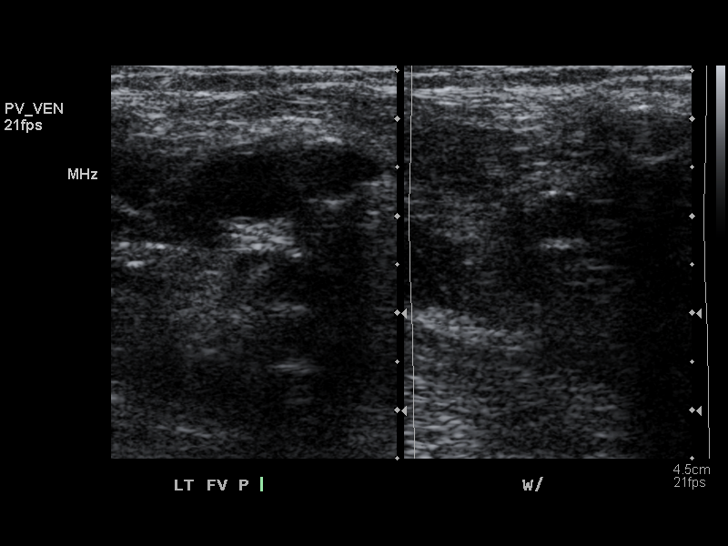
[im 5/25]
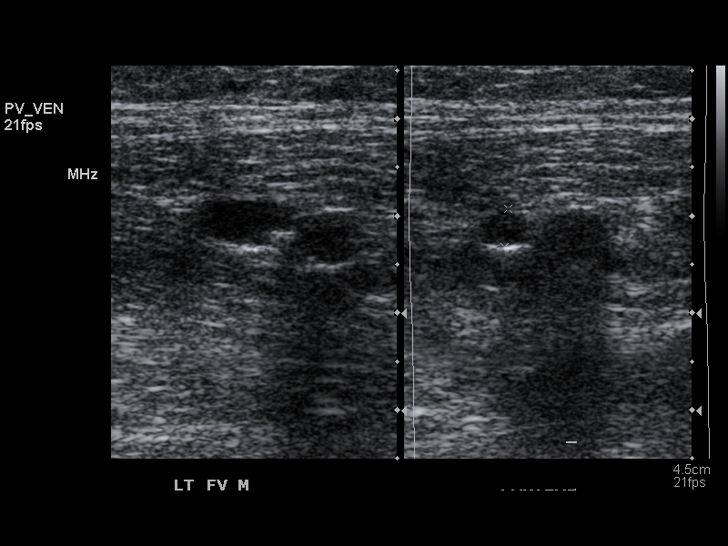
[im 7/25]
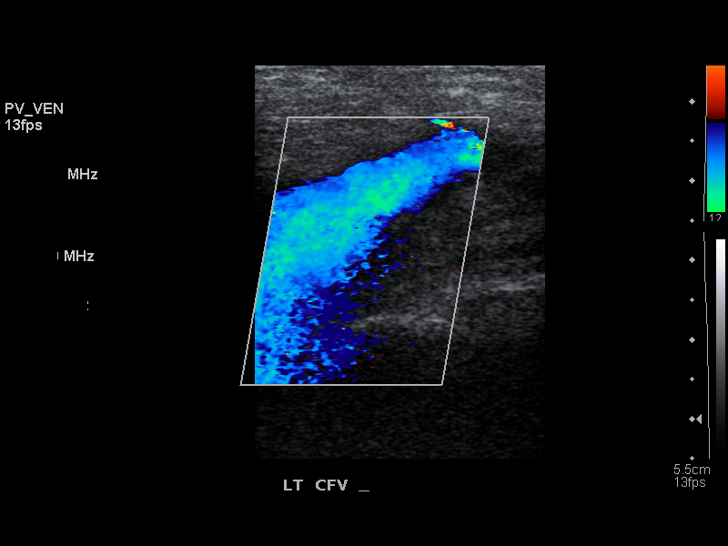
[im 8/25]
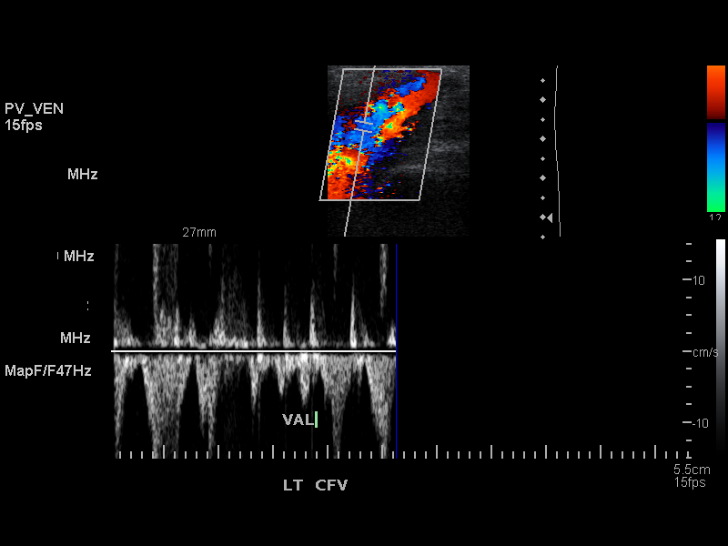
[im 10/25]
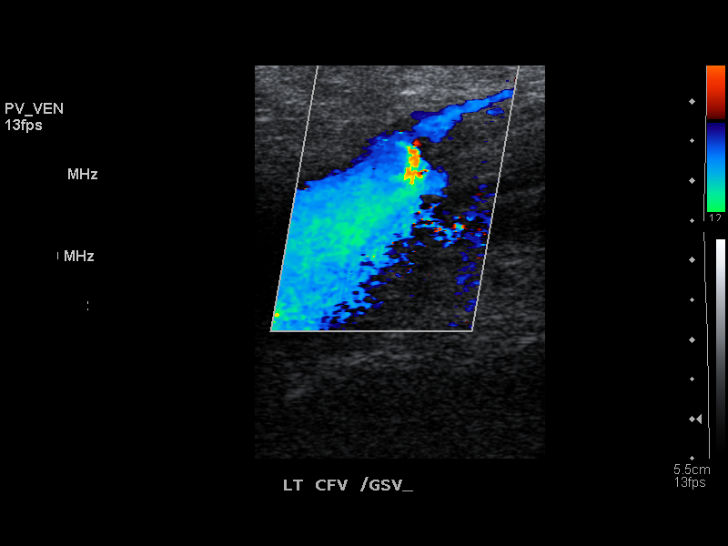
[im 12/25]
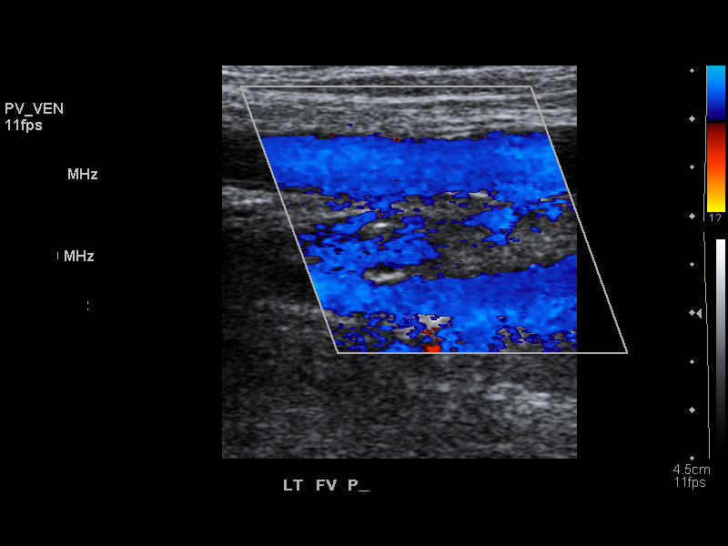
[im 13/25]
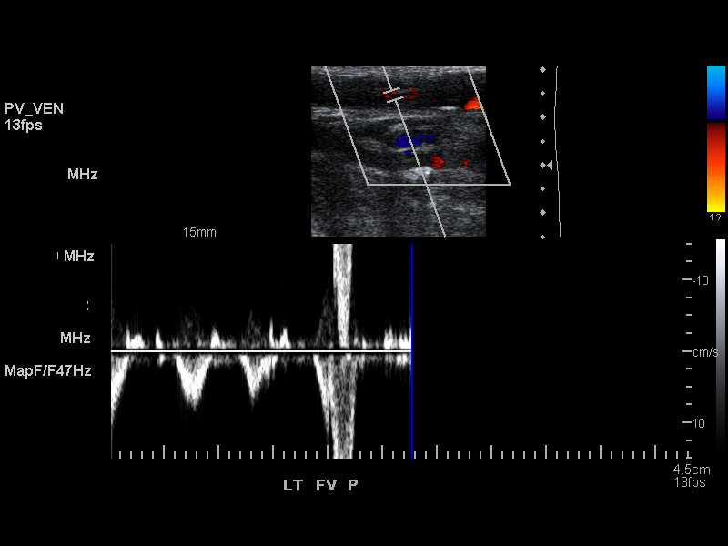
[im 15/25]
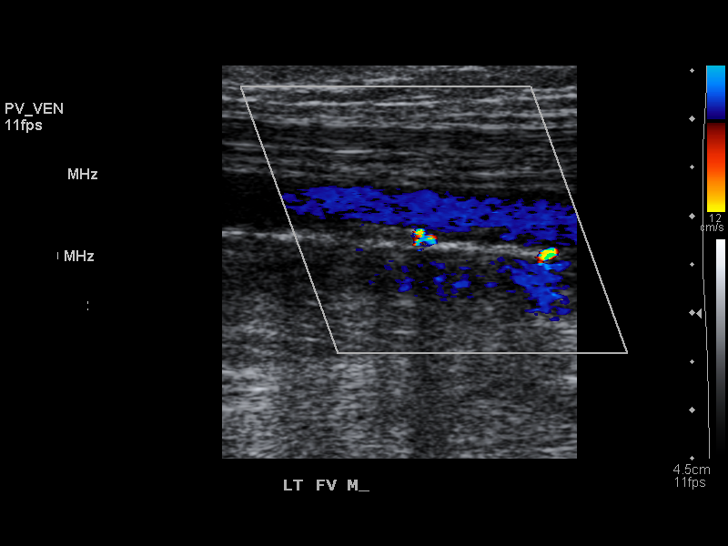
[im 17/25]
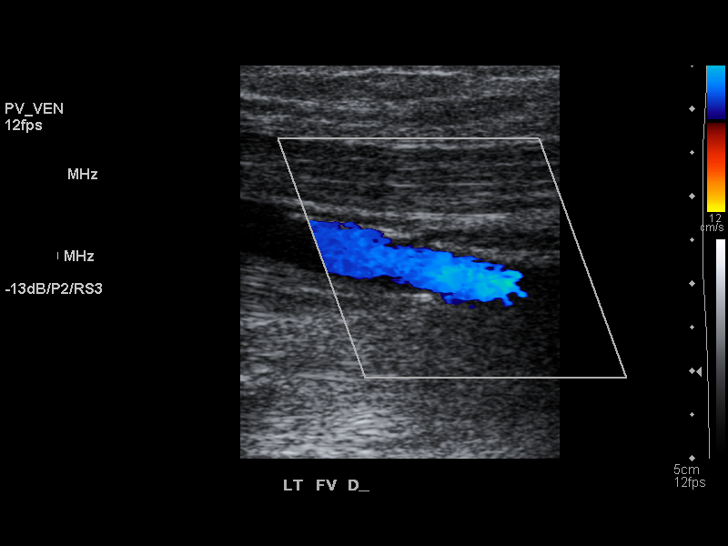
[im 19/25]
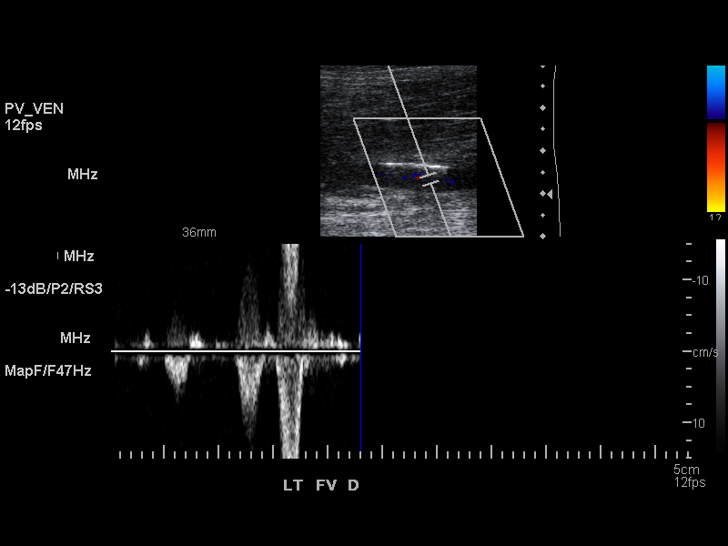
[im 20/25]
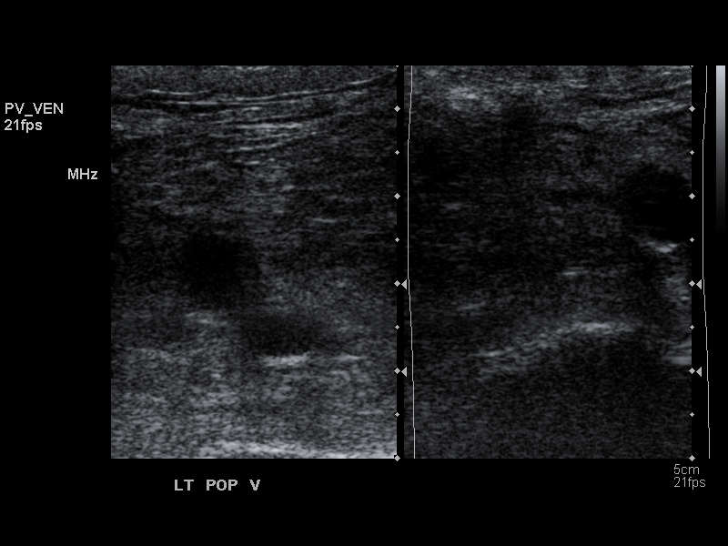
[im 22/25]
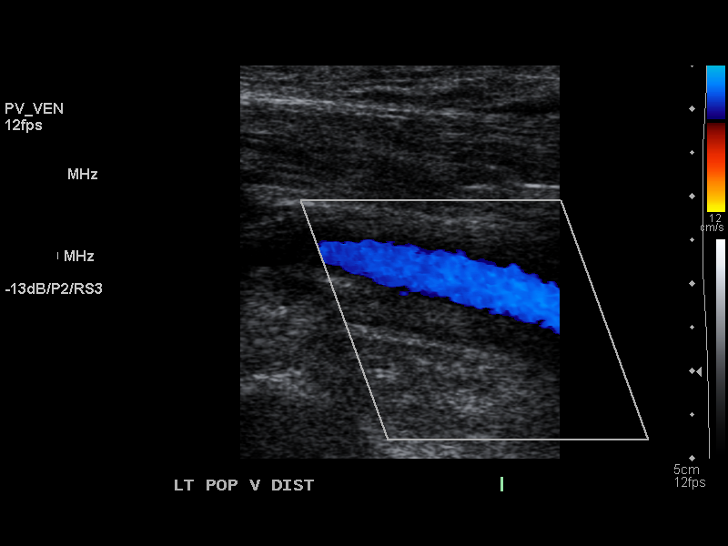
[im 25/25]
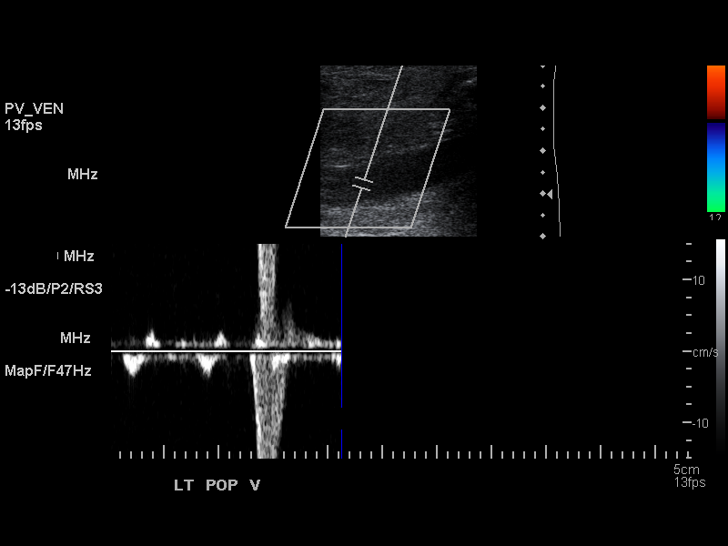

[14 of 24 positions shown; findings below may reference images not displayed]

PROCEDURE:     US  - US DOPPLER LOW EXTR LEFT  - January 25, 2011  [DATE]

RESULT:     The phasic, augmentation and Valsalva flow waveforms are normal
in appearance. There is absence of complete compressibility of the mid and
distal superficial femoral. This appears to be secondary to nonobstructive
thrombus which contains calcification and is likely chronic. Doppler
examination shows no area of occlusion or near occlusion. The popliteal is
patent.
IMPRESSION: There appears to be a small amount of chronic nonocclusive
thrombus involving the mid and distal portions of the left superficial
femoral. The common femoral and popliteal show no thrombus or other acute
change.

## 2012-07-21 ENCOUNTER — Other Ambulatory Visit: Payer: Self-pay | Admitting: Internal Medicine

## 2012-07-27 ENCOUNTER — Ambulatory Visit: Payer: Medicare Other | Admitting: Internal Medicine

## 2012-07-30 ENCOUNTER — Other Ambulatory Visit: Payer: Self-pay | Admitting: *Deleted

## 2012-07-30 MED ORDER — GLUCOSE BLOOD VI DISK: DISK | Status: DC

## 2012-07-31 ENCOUNTER — Telehealth: Payer: Self-pay | Admitting: General Practice

## 2012-07-31 MED ORDER — GLUCOSE BLOOD VI DISK: DISK | Status: AC

## 2012-07-31 NOTE — Telephone Encounter (Signed)
Pt would like to get the results from his 11/25 blood labwork. Please advise.

## 2012-08-02 ENCOUNTER — Telehealth: Payer: Self-pay | Admitting: General Practice

## 2012-08-02 NOTE — Telephone Encounter (Signed)
Pt called stating he needs a CMN for his medication faxed to his insurance company to verify his meds. [facility] had been called and no form has been faxed to our office.

## 2012-08-02 NOTE — Telephone Encounter (Signed)
Called and spoke to Wales at [facility] and was advised that the office would need to call [phone] and get this straightened out in order for script to be processed. Called and spoke to Misha at [facility]' help desk and was advised that Alvino Chapel should have called and gotten this cleared up and she will call and discuss this with her. Misha was able to fix the problem and patient should be able to get his script now. Misha said that Medicare will be sending a CMN for the doctor to complete and fax back to them prior to patient needing a refill on this. This is a requirement now by Medicare for diabetic supplies. Patient advised that problem has been cleared up and he should be able to pick his script up now.

## 2012-08-07 ENCOUNTER — Other Ambulatory Visit: Payer: Self-pay | Admitting: Internal Medicine

## 2012-08-08 ENCOUNTER — Other Ambulatory Visit (INDEPENDENT_AMBULATORY_CARE_PROVIDER_SITE_OTHER): Payer: Medicare Other

## 2012-08-08 DIAGNOSIS — Z7901 Long term (current) use of anticoagulants: Secondary | ICD-10-CM

## 2012-08-08 DIAGNOSIS — I4891 Unspecified atrial fibrillation: Secondary | ICD-10-CM

## 2012-08-08 DIAGNOSIS — Z5181 Encounter for therapeutic drug level monitoring: Secondary | ICD-10-CM

## 2012-08-08 LAB — PROTIME-INR: INR: 3.2 ratio — ABNORMAL HIGH (ref 0.8–1.0)

## 2012-08-09 ENCOUNTER — Other Ambulatory Visit: Payer: Medicare Other

## 2012-08-13 ENCOUNTER — Telehealth: Payer: Self-pay | Admitting: Internal Medicine

## 2012-08-13 ENCOUNTER — Other Ambulatory Visit (INDEPENDENT_AMBULATORY_CARE_PROVIDER_SITE_OTHER): Payer: Medicare Other

## 2012-08-13 DIAGNOSIS — I4891 Unspecified atrial fibrillation: Secondary | ICD-10-CM

## 2012-08-13 DIAGNOSIS — Z7901 Long term (current) use of anticoagulants: Secondary | ICD-10-CM

## 2012-08-13 DIAGNOSIS — Z5181 Encounter for therapeutic drug level monitoring: Secondary | ICD-10-CM

## 2012-08-13 LAB — PROTIME-INR: Prothrombin Time: 14.3 s — ABNORMAL HIGH (ref 10.2–12.4)

## 2012-08-13 NOTE — Telephone Encounter (Signed)
Why is he skipping days and doubling days?  He now needs another INR drawn

## 2012-08-13 NOTE — Telephone Encounter (Signed)
I had left a message to speak with pt regarding his regimen and he never returned call. Called pt today and he advised that a week ago he was taking 3.5  On Sunday 2.5 on Mon-wed 3.5 on Thursday 2.5 on Friday and Saturday.   Per pt he has not taken any coumadin on Tuesday, Friday ,Saturday. Thinks he might have doubled the meds last night.

## 2012-08-13 NOTE — Addendum Note (Signed)
Addended by: Sherlene Shams on: 08/13/2012 03:41 PM   Modules accepted: Orders

## 2012-08-13 NOTE — Telephone Encounter (Signed)
HIs INR was high last week; you left him a message per [facility].  Needs to stop coumadin and teel [place] his regimen.

## 2012-08-13 NOTE — Telephone Encounter (Signed)
Angelica Chessman @ [facility] [phone] called Mr Turko had skin cancer removed to left cheek  7 to 9 stiches   Pt thinks he may have taken an extra coumadin last night And Angelica Chessman stated it is bleeding from suture line moderate bleeding Mandy applied compression dressing and she also [place]  But she wanted dr Darrick Huntsman to be aware of the extra coumadin

## 2012-08-13 NOTE — Telephone Encounter (Signed)
Pt stated he would be in today to give his INR.

## 2012-08-28 ENCOUNTER — Telehealth: Payer: Self-pay

## 2012-08-28 NOTE — Telephone Encounter (Signed)
Pt scheduled appt with Dr Alphonsus Sias on 08/30/12 at 2 pm per [facility]. Pt wants to come today for PT/INR; advised pt our coumadin clinic is available on Mon and Thur. Spoke with Dr Karle Starch assistant who said Dr Alphonsus Sias said will discuss with pt on 08/30/12 and do blood ck at that time. Pt said has been over one week since had a nose bleed. Advised pt if condition changes or worsens prior to appt for pt to go to UC if needed. Pt said he felt OK today but voiced understanding.

## 2012-08-30 ENCOUNTER — Ambulatory Visit: Payer: Medicare Other | Admitting: Internal Medicine

## 2012-09-03 ENCOUNTER — Ambulatory Visit (INDEPENDENT_AMBULATORY_CARE_PROVIDER_SITE_OTHER): Payer: Medicare Other | Admitting: Internal Medicine

## 2012-09-03 ENCOUNTER — Encounter: Payer: Self-pay | Admitting: Internal Medicine

## 2012-09-03 ENCOUNTER — Ambulatory Visit: Payer: Medicare Other | Admitting: General Practice

## 2012-09-03 VITALS — BP 120/68 | HR 81 | Temp 98.2°F | Ht 67.5 in | Wt 148.0 lb

## 2012-09-03 DIAGNOSIS — I4891 Unspecified atrial fibrillation: Secondary | ICD-10-CM

## 2012-09-03 DIAGNOSIS — E039 Hypothyroidism, unspecified: Secondary | ICD-10-CM

## 2012-09-03 DIAGNOSIS — E119 Type 2 diabetes mellitus without complications: Secondary | ICD-10-CM

## 2012-09-03 DIAGNOSIS — I5032 Chronic diastolic (congestive) heart failure: Secondary | ICD-10-CM

## 2012-09-03 DIAGNOSIS — I509 Heart failure, unspecified: Secondary | ICD-10-CM

## 2012-09-03 LAB — TSH: TSH: 12.96 u[IU]/mL — ABNORMAL HIGH (ref 0.35–5.50)

## 2012-09-03 NOTE — Assessment & Plan Note (Signed)
Lab Results  Component Value Date   HGBA1C 7.6* 06/11/2012   Reasonable control Recheck next time

## 2012-09-03 NOTE — Assessment & Plan Note (Signed)
Good rate control On coumadin---due for recheck

## 2012-09-03 NOTE — Patient Instructions (Signed)
Take 1 1/2 tablets of coumadin today and then continue 2.5 mg daily, except  1.25MG  SAT recheck 4 weeks.

## 2012-09-03 NOTE — Progress Notes (Signed)
Subjective:    Patient ID: Dylan Perkins, male    DOB: 1918-05-03, 79 y.o.   MRN: [id number]  HPI Here with wife Wants to come back here Wants to get the POC testing for his protimes  Recently had skin cancer removed from left chin---had bleeding Now with new lesion on right cheek He stopped the coumadin for a few days when the bleeding was bad Now back on the 2.5 daily for some time  Does note some strong heart beat when he first gets into bed Not fast or irregular No chest pain  Monitors sugars Did rise after cortisone shot in ankle Stable lately between 100-150 in AM No clinical hypoglycemia  Has been on higher thyroid dose for a while Did have one day where he "froze up"---thought it might be related to his thyroid  Current Outpatient Prescriptions on File Prior to Visit  Medication Sig Dispense Refill  . alum & mag hydroxide-simeth (MYLANTA) 200-200-20 MG/5ML suspension Take by mouth as needed.        Marland Kitchen aspirin 81 MG tablet Take 81 mg by mouth every other day.        Marland Kitchen atenolol (TENORMIN) 25 MG tablet Take 1 tablet (25 mg total) by mouth daily.  90 tablet  6  . calcium carbonate (TUMS) 500 MG chewable tablet Chew 1 tablet by mouth daily as needed.       . docusate sodium (COLACE) 100 MG capsule Take 100 mg by mouth 2 (two) times daily.      . furosemide (LASIX) 80 MG tablet Take 40 mg by mouth 2 (two) times daily.      Marland Kitchen glipiZIDE (GLUCOTROL) 10 MG tablet TAKE 1 TABLET TWICE DAILY  60 tablet  9  . Glucose Blood (BAYER BREEZE 2 TEST) DISK TEST UP TO 4 TIMES PER DAY, dx: 250.00  100 each  6  . HYDROcodone-acetaminophen (VICODIN) 5-500 MG per tablet Take 1 tablet by mouth every 6 (six) hours as needed.       Marland Kitchen ipratropium (ATROVENT) 0.03 % nasal spray Place 2 sprays into the nose every 12 (twelve) hours as needed.  30 mL  1  . isosorbide mononitrate (IMDUR) 30 MG 24 hr tablet TAKE 1 TABLET (30 MG TOTAL) BY MOUTH DAILY.  30 tablet  11  . levothyroxine (SYNTHROID, LEVOTHROID) 50  MCG tablet Take 1 tablet (50 mcg total) by mouth daily.  30 tablet  6  . LIDODERM 5 % as needed.       Marland Kitchen losartan (COZAAR) 25 MG tablet Take 1 tablet (25 mg total) by mouth daily.  90 tablet  4  . metolazone (ZAROXOLYN) 5 MG tablet Take 5 mg by mouth daily. Three times a week      . Multiple Vitamin (MULTIVITAMIN) tablet Take 1 tablet by mouth daily after breakfast.        . ranitidine (ZANTAC) 150 MG capsule Take 150 mg by mouth daily as needed.       . traMADol (ULTRAM) 50 MG tablet Take 1 tablet (50 mg total) by mouth at bedtime as needed for pain.  30 tablet  1  . warfarin (COUMADIN) 1 MG tablet Take 1 tablet (1 mg total) by mouth as directed.  10 tablet  3  . warfarin (COUMADIN) 1 MG tablet Take 1 mg by mouth as directed. Patient takes 2.5 mg x 6 days and 1.5 mg x 1 day      . warfarin (COUMADIN) 2.5 MG tablet Take 1 tablet (  2.5 mg total) by mouth daily. 2.5 mg daily except Sundays and Thursdays take 3.5 mg  30 tablet  2   No current facility-administered medications on file prior to visit.    Allergies  Allergen Reactions  . Lisinopril     REACTION: cough    Past Medical History  Diagnosis Date  . Allergic rhinitis   . Anemia     NOS  . CAD (coronary artery disease)   . DM II (diabetes mellitus, type II), controlled   . HLD (hyperlipidemia)   . HTN (hypertension)   . OA (osteoarthritis)   . BPH (benign prostatic hypertrophy)   . Diverticulosis of colon     with repeated bleeds  . Atrial fibrillation or flutter 11/09  . Depression   . CHF (congestive heart failure)   . Thyroid disease   . GI bleed 2013    Non determined  . Pulmonary hypertension 2013    On Echo 07/2011  . Aortic stenosis   . Heart murmur     Past Surgical History  Procedure Laterality Date  . Tonsillectomy and adenoidectomy  1933  . Coronary artery bypass graft  1995  . Coronary artery bypass graft  1999    Redo  . Hemorrhoid surgery  1974  . Transurethral resection of prostate  1993  . Nasal  stenosis repair  1996  . Aortic valve replacement  1999    pig valve  . Total shoulder replacement  1992    right  . Retinal vitrectomy      Left  . Cataract extraction  1999  . Carcinoma      head    Family History  Problem Relation Age of Onset  . Cancer Mother     (male)  . Parkinsonism Maternal Grandfather   . Mental retardation Son   . Colon cancer Neg Hx     History   Social History  . Marital Status: Married    Spouse Name: N/A    Number of Children: 5  . Years of Education: N/A   Occupational History  . 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 Smoking status: Former Smoker    Quit date: 07/18/1950  . Smokeless tobacco: Never Used  . Alcohol Use: Yes     Comment: one daily   . Drug Use: No  . Sexually Active: Not on file   Other Topics Concern  . Not on file   Social History Narrative   Has living will   Wife has health care POA---then son, Weston Brass   Requests DNR and order done   Would not want a feeding tube   Daily caffeine    Review of Systems Appetite remains good Has lost vision in right eye---very depressing to him No regular anhedonia Reading a lot with Kindle Some tremor with eating---affects him some    Objective:   Physical Exam  Constitutional: He appears well-developed and well-nourished. No distress.  Neck: Normal range of motion. Neck supple. No thyromegaly present.  Cardiovascular: Normal rate.  Exam reveals no gallop.   No murmur heard. Irregular Faint pulses in feet  Pulmonary/Chest: Effort normal and breath sounds normal. No respiratory distress. He has no wheezes. He has no rales.  Musculoskeletal: He exhibits edema.  1-2+ tense edema in calves Slight edema in feet  Lymphadenopathy:    He has no cervical adenopathy.  Psychiatric: He has a normal mood and affect. His behavior is normal.  Assessment & Plan:

## 2012-09-03 NOTE — Assessment & Plan Note (Signed)
Dose increased Due for labs again

## 2012-09-03 NOTE — Assessment & Plan Note (Signed)
Some edema that would be better with leg elevation No pulmonary findings

## 2012-09-06 ENCOUNTER — Encounter: Payer: Self-pay | Admitting: *Deleted

## 2012-09-07 ENCOUNTER — Telehealth: Payer: Self-pay | Admitting: General Practice

## 2012-09-11 ENCOUNTER — Ambulatory Visit (INDEPENDENT_AMBULATORY_CARE_PROVIDER_SITE_OTHER): Payer: Medicare Other | Admitting: Family Medicine

## 2012-09-11 ENCOUNTER — Encounter: Payer: Self-pay | Admitting: Family Medicine

## 2012-09-11 ENCOUNTER — Ambulatory Visit (INDEPENDENT_AMBULATORY_CARE_PROVIDER_SITE_OTHER)
Admission: RE | Admit: 2012-09-11 | Discharge: 2012-09-11 | Disposition: A | Discharge status: Home / Self Care | Payer: Medicare Other | Source: Ambulatory Visit | Attending: Family Medicine | Admitting: Family Medicine

## 2012-09-11 VITALS — BP 108/72 | HR 82 | Temp 97.9°F | Ht 67.5 in | Wt 143.0 lb

## 2012-09-11 DIAGNOSIS — M545 Low back pain: Secondary | ICD-10-CM

## 2012-09-11 LAB — POCT URINALYSIS DIPSTICK
Bilirubin, UA: NEGATIVE
Ketones, UA: NEGATIVE
Spec Grav, UA: 1.01
pH, UA: 7.5

## 2012-09-11 NOTE — Patient Instructions (Addendum)
Urine culture sent to look for infection  Xray now  Use heat as needed - 10 minutes at a time Try to walk slowly Try miralax otc for constipation

## 2012-09-11 NOTE — Progress Notes (Signed)
Subjective:    Patient ID: Dylan Perkins, male    DOB: 06-07-1918, 79 y.o.   MRN: [id number]  HPI Here with back pain   Started on Sunday - in very low back -right in the middle (no flank pain) There is some positional component - bed is very uncomfortable - and then ends up sitting in a chair to sleep Has never been quite this bad  Went over some speed bumps in his golf cart this weekend - and this may have aggrivated it  Pain is not radiating into either leg  No focal numbness or weakness   Is constipated and tends to be bloated  No burning to urinate No blood in urine  Urine does have an odor Probably does not drink enough water  No hx of uti or prostate infectoin  Does have bph   Has hx of deg disc dz Takes pain med   Also ua pos for blood today- will cx that  Denies urine hesitancy/frequency   Patient Active Problem List  Diagnosis  . Unspecified hypothyroidism  . DIABETES MELLITUS, TYPE II  . HYPERLIPIDEMIA  . Anemia, iron deficiency  . DEPRESSION  . HYPERTENSION  . CORONARY ARTERY DISEASE  . ATRIAL FIBRILLATION  . ATRIAL FLUTTER  . ALLERGIC RHINITIS  . DIVERTICULOSIS, COLON  . BENIGN PROSTATIC HYPERTROPHY  . ACTINIC KERATOSIS  . Osteoarthritis, shoulder  . BACK PAIN, LUMBAR  . EDEMA  . CAD, ARTERY BYPASS GRAFT  . Pulmonary HTN  . Chronic diastolic CHF (congestive heart failure)  . Pleural effusion due to CHF (congestive heart failure)  . Pressure ulcer, buttock(707.05)  . Long term (current) use of anticoagulants  . Diabetes  . Abnormal urinalysis   Past Medical History  Diagnosis Date  . Allergic rhinitis   . Anemia     NOS  . CAD (coronary artery disease)   . DM II (diabetes mellitus, type II), controlled   . HLD (hyperlipidemia)   . HTN (hypertension)   . OA (osteoarthritis)   . BPH (benign prostatic hypertrophy)   . Diverticulosis of colon     with repeated bleeds  . Atrial fibrillation or flutter 11/09  . Depression   . CHF (congestive  heart failure)   . Thyroid disease   . GI bleed 2013    Non determined  . Pulmonary hypertension 2013    On Echo 07/2011  . Aortic stenosis   . Heart murmur    Past Surgical History  Procedure Laterality Date  . Tonsillectomy and adenoidectomy  1933  . Coronary artery bypass graft  1995  . Coronary artery bypass graft  1999    Redo  . Hemorrhoid surgery  1974  . Transurethral resection of prostate  1993  . Nasal stenosis repair  1996  . Aortic valve replacement  1999    pig valve  . Total shoulder replacement  1992    right  . Retinal vitrectomy      Left  . Cataract extraction  1999  . Carcinoma      head   History  Substance Use Topics  . Smoking status: Former Smoker    Quit date: 07/18/1950  . Smokeless tobacco: Never Used  . Alcohol Use: Yes     Comment: one daily    Family History  Problem Relation Age of Onset  . Cancer Mother     (male)  . Parkinsonism Maternal Grandfather   . Mental retardation Son   . Colon cancer Neg Hx  Allergies  Allergen Reactions  . Lisinopril     REACTION: cough   Current Outpatient Prescriptions on File Prior to Visit  Medication Sig Dispense Refill  . alum & mag hydroxide-simeth (MYLANTA) 200-200-20 MG/5ML suspension Take by mouth as needed.        Marland Kitchen aspirin 81 MG tablet Take 81 mg by mouth every other day.        Marland Kitchen atenolol (TENORMIN) 25 MG tablet Take 1 tablet (25 mg total) by mouth daily.  90 tablet  6  . calcium carbonate (TUMS) 500 MG chewable tablet Chew 1 tablet by mouth daily as needed.       . docusate sodium (COLACE) 100 MG capsule Take 100 mg by mouth 2 (two) times daily.      . ferrous sulfate 325 (65 FE) MG tablet Take 325 mg by mouth daily with breakfast.      . furosemide (LASIX) 80 MG tablet Take 40 mg by mouth 2 (two) times daily.      Marland Kitchen glipiZIDE (GLUCOTROL) 10 MG tablet TAKE 1 TABLET TWICE DAILY  60 tablet  9  . Glucose Blood (BAYER BREEZE 2 TEST) DISK TEST UP TO 4 TIMES PER DAY, dx: 250.00  100 each   6  . HYDROcodone-acetaminophen (VICODIN) 5-500 MG per tablet Take 1 tablet by mouth every 6 (six) hours as needed.       Marland Kitchen ipratropium (ATROVENT) 0.03 % nasal spray Place 2 sprays into the nose every 12 (twelve) hours as needed.  30 mL  1  . isosorbide mononitrate (IMDUR) 30 MG 24 hr tablet TAKE 1 TABLET (30 MG TOTAL) BY MOUTH DAILY.  30 tablet  11  . levothyroxine (SYNTHROID, LEVOTHROID) 50 MCG tablet Take 1 tablet (50 mcg total) by mouth daily.  30 tablet  6  . LIDODERM 5 % as needed.       Marland Kitchen losartan (COZAAR) 25 MG tablet Take 1 tablet (25 mg total) by mouth daily.  90 tablet  4  . metolazone (ZAROXOLYN) 5 MG tablet Take 5 mg by mouth daily. Three times a week      . Multiple Vitamin (MULTIVITAMIN) tablet Take 1 tablet by mouth daily after breakfast.        . ranitidine (ZANTAC) 150 MG capsule Take 150 mg by mouth daily as needed.       . traMADol (ULTRAM) 50 MG tablet Take 1 tablet (50 mg total) by mouth at bedtime as needed for pain.  30 tablet  1  . warfarin (COUMADIN) 1 MG tablet Take 1 tablet (1 mg total) by mouth as directed.  10 tablet  3  . warfarin (COUMADIN) 1 MG tablet Take 1 mg by mouth as directed. Patient takes 2.5 mg x 6 days and 1.5 mg x 1 day      . warfarin (COUMADIN) 2.5 MG tablet Take 1 tablet (2.5 mg total) by mouth daily. 2.5 mg daily except Sundays and Thursdays take 3.5 mg  30 tablet  2   No current facility-administered medications on file prior to visit.    Review of Systems Review of Systems  Constitutional: Negative for fever, appetite change,  and unexpected weight change.  Eyes: Negative for pain and visual disturbance.  Respiratory: Negative for cough and shortness of breath.   Cardiovascular: Negative for cp or palpitations    Gastrointestinal: Negative for nausea, diarrhea and pos for constipation, neg for blood in stool  Genitourinary: Negative for urgency and frequency. neg for flank pain pos  for odor in urine  Skin: Negative for pallor or rash    Neurological: Negative for weakness, light-headedness, numbness and headaches.  Hematological: Negative for adenopathy. Does not bruise/bleed easily.  Psychiatric/Behavioral: Negative for dysphoric mood. The patient is not nervous/anxious.         Objective:   Physical Exam  Constitutional: He appears well-developed and well-nourished. No distress.  Frail appearing elderly male with walker  HENT:  Head: Normocephalic and atraumatic.  Eyes: Conjunctivae and EOM are normal. Pupils are equal, round, and reactive to light. No scleral icterus.  Neck: Normal range of motion. Neck supple. No thyromegaly present.  Cardiovascular: Normal rate.  Exam reveals no gallop.   Pulmonary/Chest: Effort normal and breath sounds normal. No respiratory distress. He has no wheezes.  Abdominal: Soft. Bowel sounds are normal. He exhibits no distension and no mass. There is no tenderness.  No suprapubic tenderness or fullness    Musculoskeletal: He exhibits tenderness. He exhibits no edema.       Lumbar back: He exhibits decreased range of motion, tenderness, bony tenderness and spasm. He exhibits no swelling.  No cva tenderness   Thoracolumbar scoliosis noted  Tender over LS lower  Some muscular spasm on the L  Bent knee raise is nl  Ambulates very slowly with walker-uncomfortable  Lymphadenopathy:    He has no cervical adenopathy.  Neurological: He is alert. He has normal reflexes. No sensory deficit. He exhibits normal muscle tone.  Skin: Skin is warm and dry. No rash noted. No erythema. No pallor.  Psychiatric: He has a normal mood and affect.          Assessment & Plan:

## 2012-09-11 NOTE — Assessment & Plan Note (Signed)
Hx of disc dz and scoliosis  Diff incl this/ LS comp fx from trauma/ or urinary infx  Will cx urine Xray now

## 2012-09-11 NOTE — Assessment & Plan Note (Signed)
cx pending Low back pain and malodorous urine Hx of bph- pt denies voiding sympt

## 2012-09-13 ENCOUNTER — Telehealth: Payer: Self-pay | Admitting: Family Medicine

## 2012-09-13 LAB — URINE CULTURE

## 2012-09-13 MED ORDER — TRAMADOL HCL 50 MG PO TABS: 50.0000 mg | ORAL_TABLET | Freq: Three times a day (TID) | ORAL | Status: DC | PRN

## 2012-09-13 NOTE — Telephone Encounter (Signed)
Pt notified Rx was sent and to try instead of vicodin, Rx called in as prescribed

## 2012-09-13 NOTE — Telephone Encounter (Signed)
Message copied by Judy Pimple on Thu Sep 13, 2012  2:01 PM ------      Message from: Shon Millet      Created: Thu Sep 13, 2012 12:01 PM       appt scheduled for 09/17/12 with Dr. Hortencia Pilar but pt said he isn't taking any tramadol so he would like a Rx sent for it if you think it would help ------

## 2012-09-13 NOTE — Telephone Encounter (Signed)
Px written for call in  Please Watch out for sedation or dizziness with it - it looks like he has had this before  Try this instead of the vicodin he is taking and see if it is more effective

## 2012-09-14 ENCOUNTER — Telehealth: Payer: Self-pay

## 2012-09-14 NOTE — Telephone Encounter (Signed)
If miralax is not helping otc once daily- try twice - it can take a while to work Ok to try some mineral oil-follow directions on the bottle  If worse or no imp by Monday- update and if vomiting or worsening abd pain call earlier or seek care

## 2012-09-14 NOTE — Telephone Encounter (Signed)
Pt notified of Dr. Tower's comments and verbalized understanding  

## 2012-09-14 NOTE — Telephone Encounter (Signed)
Pt has appt on 09/17/12 to see Dr Patsy Lager; but pt has not had BM since 09/09/12. Laxative not helping and pt needs relief; pt wants to know if OK to take mineral oil and if so how much. Pt said Tramadol helping back pain but makes pt very drowsy. Advised to be careful when moving around not to fall.[facility].Pt request call back.

## 2012-09-17 ENCOUNTER — Encounter: Payer: Self-pay | Admitting: Family Medicine

## 2012-09-17 ENCOUNTER — Ambulatory Visit (INDEPENDENT_AMBULATORY_CARE_PROVIDER_SITE_OTHER): Payer: Medicare Other | Admitting: Family Medicine

## 2012-09-17 VITALS — BP 126/74 | HR 90 | Temp 97.9°F | Ht 67.5 in | Wt 141.0 lb

## 2012-09-17 DIAGNOSIS — M549 Dorsalgia, unspecified: Secondary | ICD-10-CM

## 2012-09-17 MED ORDER — LIDOCAINE 5 % EX PTCH: MEDICATED_PATCH | CUTANEOUS | Status: DC

## 2012-09-17 NOTE — Patient Instructions (Addendum)
REFERRAL: GO THE THE FRONT ROOM AT THE ENTRANCE OF OUR CLINIC, NEAR CHECK IN. ASK FOR Dylan Perkins. SHE WILL HELP YOU SET UP YOUR REFERRAL. DATE: TIME:  

## 2012-09-17 NOTE — Progress Notes (Signed)
Nature conservation officer at [facility] [address] [place] [address] Phone: [phone] Fax: [phone]  Date:  09/17/2012   Name:  Dylan Perkins   DOB:  Aug 20, 1917   MRN:  [id number] Gender: male Age: 79 y.o.  Primary Physician:  Tillman Abide, MD  Evaluating MD: Hannah Beat, MD   Chief Complaint: Back Pain   History of Present Illness:  Dylan Perkins is a 79 y.o. pleasant patient who presents with the following:  [place] - a week ago Saturday, felta speed bump, felt something. And Sunday a week ago, hardly a week ago. Maybe a little better. Rest helps. Took pain pills. Has some constipation. He is mostly having pain on the LEFT side in the erector spinae complex. No alteration of sensation or strength. At baseline he walks with a rolling walker. He has tried some tramadol, but this does make him slightly dizzy and does not feel like himself. With either this or the Vicodin he feels as if he has some altered mentation even with the half pill, and he has been quite constipated.  Some slow urination. This is not new, and it is been present for many months to years.  Dg Lumbar Spine Complete  09/11/2012  *RADIOLOGY REPORT*  Clinical Data: Low back pain, scoliosis  LUMBAR SPINE - COMPLETE 4+ VIEW  Comparison: None.  Findings: Five lumbar-type vertebral bodies.  Lumbar levoscoliosis.  Mild superior endplate compression deformities at L1 and L2, age indeterminate but favored to be chronic.  No retropulsion.  Moderate multilevel degenerative changes.  Vascular calcifications  IMPRESSION: Mild superior endplate compression deformities at L1 and L2, age indeterminate but favored to be chronic.  Moderate multilevel degenerative changes.   Original Report Authenticated By: Charline Bills, M.D.     Independently reviewed by myself and agree. The compression deformities or not new however.  Prior MRI findings reviewed from last MRI on Dec 09, 2009: Independent films are not  available for my review. Partial compression fractures of L2 and L1 are noted in 2011 MRI. Ventral impression on the CSF thecal sac at L2-3, L4-5 and L5-S1. At L2-3 there is mild encroachment upon the bilateral neural foramina. Mild disc bulging. At L3-4 mild disc bulging as well. Mild encroachment upon bilateral neural foramina L3-4. At L4-5 there is a broad based annular disc bulge. There is reduction of the AP dimension of the thecal sac. There is transverse dimensionals final count narrowing due to facet joint hypertrophy and ligamentum flavum hypertrophy. There is also mild encroachment upon bilateral neural foramen at L4-5.  Patient Active Problem List  Diagnosis  . Unspecified hypothyroidism  . DIABETES MELLITUS, TYPE II  . HYPERLIPIDEMIA  . Anemia, iron deficiency  . DEPRESSION  . HYPERTENSION  . CORONARY ARTERY DISEASE  . ATRIAL FIBRILLATION  . ATRIAL FLUTTER  . ALLERGIC RHINITIS  . DIVERTICULOSIS, COLON  . BENIGN PROSTATIC HYPERTROPHY  . ACTINIC KERATOSIS  . Osteoarthritis, shoulder  . BACK PAIN, LUMBAR  . EDEMA  . CAD, ARTERY BYPASS GRAFT  . Pulmonary HTN  . Chronic diastolic CHF (congestive heart failure)  . Pleural effusion due to CHF (congestive heart failure)  . Pressure ulcer, buttock(707.05)  . Long term (current) use of anticoagulants  . Diabetes  . Abnormal urinalysis    Past Medical History  Diagnosis Date  . Allergic rhinitis   . Anemia     NOS  . CAD (coronary artery disease)   . DM II (diabetes mellitus, type II), controlled   .  HLD (hyperlipidemia)   . HTN (hypertension)   . OA (osteoarthritis)   . BPH (benign prostatic hypertrophy)   . Diverticulosis of colon     with repeated bleeds  . Atrial fibrillation or flutter 11/09  . Depression   . CHF (congestive heart failure)   . Thyroid disease   . GI bleed 2013    Non determined  . Pulmonary hypertension 2013    On Echo 07/2011  . Aortic stenosis   . Heart murmur     Past Surgical History    Procedure Laterality Date  . Tonsillectomy and adenoidectomy  1933  . Coronary artery bypass graft  1995  . Coronary artery bypass graft  1999    Redo  . Hemorrhoid surgery  1974  . Transurethral resection of prostate  1993  . Nasal stenosis repair  1996  . Aortic valve replacement  1999    pig valve  . Total shoulder replacement  1992    right  . Retinal vitrectomy      Left  . Cataract extraction  1999  . Carcinoma      head    History   Social History  . Marital Status: Married    Spouse Name: N/A    Number of Children: 5  . Years of Education: N/A   Occupational History  . 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 Smoking status: Former Smoker    Quit date: 07/18/1950  . Smokeless tobacco: Never Used  . Alcohol Use: Yes     Comment: one daily   . Drug Use: No  . Sexually Active: Not on file   Other Topics Concern  . Not on file   Social History Narrative   Has living will   Wife has health care POA---then son, Weston Brass   Requests DNR and order done   Would not want a feeding tube   Daily caffeine     Family History  Problem Relation Age of Onset  . Cancer Mother     (male)  . Parkinsonism Maternal Grandfather   . Mental retardation Son   . Colon cancer Neg Hx     Allergies  Allergen Reactions  . Lisinopril     REACTION: cough    Medication list has been reviewed and updated.  Outpatient Prescriptions Prior to Visit  Medication Sig Dispense Refill  . alum & mag hydroxide-simeth (MYLANTA) 200-200-20 MG/5ML suspension Take by mouth as needed.        Marland Kitchen aspirin 81 MG tablet Take 81 mg by mouth every other day.        Marland Kitchen atenolol (TENORMIN) 25 MG tablet Take 1 tablet (25 mg total) by mouth daily.  90 tablet  6  . docusate sodium (COLACE) 100 MG capsule Take 300 mg by mouth daily.       . furosemide (LASIX) 80 MG tablet Take 40 mg by mouth 2 (two) times daily.      Marland Kitchen glipiZIDE (GLUCOTROL) 10 MG tablet TAKE 1 TABLET  TWICE DAILY  60 tablet  9  . Glucose Blood (BAYER BREEZE 2 TEST) DISK TEST UP TO 4 TIMES PER DAY, dx: 250.00  100 each  6  . isosorbide mononitrate (IMDUR) 30 MG 24 hr tablet TAKE 1 TABLET (30 MG TOTAL) BY MOUTH DAILY.  30 tablet  11  . losartan (COZAAR) 25 MG tablet Take 1 tablet (25 mg total) by mouth daily.  90 tablet  4  . metolazone (  ZAROXOLYN) 5 MG tablet Take 5 mg by mouth daily. Three times a week      . Multiple Vitamin (MULTIVITAMIN) tablet Take 1 tablet by mouth daily after breakfast.        . traMADol (ULTRAM) 50 MG tablet Take 1 tablet (50 mg total) by mouth every 8 (eight) hours as needed for pain.  30 tablet  0  . warfarin (COUMADIN) 2.5 MG tablet Take 1 tablet (2.5 mg total) by mouth daily. 2.5 mg daily except Sundays and Thursdays take 3.5 mg  30 tablet  2  . levothyroxine (SYNTHROID, LEVOTHROID) 50 MCG tablet Take 1 tablet (50 mcg total) by mouth daily.  30 tablet  6  . warfarin (COUMADIN) 1 MG tablet Take 1 tablet (1 mg total) by mouth as directed.  10 tablet  3  . calcium carbonate (TUMS) 500 MG chewable tablet Chew 1 tablet by mouth daily as needed.       . ferrous sulfate 325 (65 FE) MG tablet Take 325 mg by mouth daily with breakfast.      . HYDROcodone-acetaminophen (VICODIN) 5-500 MG per tablet Take 1 tablet by mouth every 6 (six) hours as needed.       Marland Kitchen ipratropium (ATROVENT) 0.03 % nasal spray Place 2 sprays into the nose every 12 (twelve) hours as needed.  30 mL  1  . LIDODERM 5 % as needed.       . ranitidine (ZANTAC) 150 MG capsule Take 150 mg by mouth daily as needed.       . warfarin (COUMADIN) 1 MG tablet Take 1 mg by mouth as directed. Patient takes 2.5 mg x 6 days and 1.5 mg x 1 day       No facility-administered medications prior to visit.    Review of Systems:   GEN: No fevers, chills. Nontoxic. Primarily MSK c/o today. MSK: Detailed in the HPI GI: tolerating PO intake without difficulty Neuro: No numbness, parasthesias, or tingling  associated. Otherwise the pertinent positives of the ROS are noted above.    Physical Examination: BP 126/74  Pulse 90  Temp(Src) 97.9 F (36.6 C) (Oral)  Ht 5' 7.5" (1.715 m)  Wt 141 lb (63.957 kg)  BMI 21.75 kg/m2  SpO2 98%  Ideal Body Weight: Weight in (lb) to have BMI = 25: 161.7   GEN: Well-developed,well-nourished,in no acute distress; alert,appropriate and cooperative throughout examination HEENT: Normocephalic and atraumatic without obvious abnormalities. Ears, externally no deformities PULM: Breathing comfortably in no respiratory distress EXT: No clubbing, cyanosis, or edema PSYCH: Normally interactive. Cooperative during the interview. Pleasant. Friendly and conversant. Not anxious or depressed appearing. Normal, full affect.  Range of motion at  the waist: Relatively preserved for age with some limitation in for flexion and extension.  No echymosis or edema Rises to examination table with no difficulty Gait: somewhat shuffling. Been over at the waist. Uses a walker.  Inspection/Deformity: N Paraspinus Tenderness: yes, mostly left sided around L3-5  B Ankle Dorsiflexion (L5,4): 5/5 B Great Toe Dorsiflexion (L5,4): 5/5 Heel Walk (L5): WNL Toe Walk (S1): WNL Rise/Squat (L4): WNL  SENSORY B Medial Foot (L4): WNL B Dorsum (L5): WNL B Lateral (S1): WNL Light Touch: WNL Pinprick: WNL  REFLEXES Knee (L4): 2+ Ankle (S1): 2+  B SLR, seated: neg B SLR, supine: neg B FABER: neg B Reverse FABER: neg B Greater Troch: NT B Log Roll: neg B Stork: NT B Sciatic Notch: NT   Assessment and Plan:  Back pain - Plan:  Ambulatory referral to Physical Therapy  Diffuse final pathology with extensive findings throughout lumbar spine on MRI in a 79 year old. Any of these could contribute to pain sensations in the back.  Given focal muscular findings on exam, I would favor a more muscular involvement. I would not favor any form of aggressive intervention in this case  and I again asked him to decrease his pain medication use for concern of side effects and potential falls.  Consult physical therapy for assistance with gait and modalities for pain control. I am also getting give him some lidocaine patches to use 1-2 as needed for pain  Orders Today:  Orders Placed This Encounter  Procedures  . Ambulatory referral to Physical Therapy    Referral Priority:  Routine    Referral Type:  Physical Medicine    Referral Reason:  Specialty Services Required    Requested Specialty:  Physical Therapy    Number of Visits Requested:  1    Updated Medication List: (Includes new medications, updates to list, dose adjustments) Meds ordered this encounter  Medications  . levothyroxine (SYNTHROID, LEVOTHROID) 50 MCG tablet    Sig: Take 25 mcg by mouth daily.  Marland Kitchen lidocaine (LIDODERM) 5 %    Sig: 1-2 patches as needed to back. Wear up to 12 hours a day    Dispense:  30 patch    Refill:  3    Medications Discontinued: Medications Discontinued During This Encounter  Medication Reason  . warfarin (COUMADIN) 1 MG tablet Duplicate  . levothyroxine (SYNTHROID, LEVOTHROID) 50 MCG tablet   . LIDODERM 5 % Reorder      Signed, Spencer T. Copland, MD 09/17/2012 9:56 AM

## 2012-09-19 NOTE — Telephone Encounter (Signed)
Opened in error

## 2012-09-20 ENCOUNTER — Telehealth: Payer: Self-pay | Admitting: Family Medicine

## 2012-09-20 NOTE — Telephone Encounter (Signed)
Muscle relaxers are more likely to cause drowsiness and falls Have him stop the tramadol Try heat Hydrocodone 5/325  1/2-1 tab every 4 hours prn #30 x 0

## 2012-09-20 NOTE — Telephone Encounter (Signed)
Caller: Dylan Perkins/Patient; Phone: [phone]; Reason for Call: Saw doctor for back pain and was given Tramadol to take for pain.  He said it makes him a little sleepy but doesn't help that much with the pain.  He said everything is the same since his office visit.  No new sxs He was wondering if something different could be prescribed that may help pain more.  He was wondering about muscle relaxer.

## 2012-09-25 NOTE — Telephone Encounter (Signed)
Spoke with patient and he doesn't want to try the hydrocodone, he will stay with the tramadol, per pt he doesn't like the hydrocodone. He states that the tramadol helps a little. I advised him to call back if any problems

## 2012-10-01 ENCOUNTER — Ambulatory Visit (INDEPENDENT_AMBULATORY_CARE_PROVIDER_SITE_OTHER): Payer: Medicare Other | Admitting: General Practice

## 2012-10-01 LAB — POCT INR: INR: 1.9

## 2012-10-01 NOTE — Patient Instructions (Signed)
Take 1 tablet every day and re-check in 4 weeks.

## 2012-10-15 ENCOUNTER — Ambulatory Visit (INDEPENDENT_AMBULATORY_CARE_PROVIDER_SITE_OTHER): Payer: Medicare Other

## 2012-10-15 ENCOUNTER — Other Ambulatory Visit: Payer: Self-pay

## 2012-10-15 ENCOUNTER — Telehealth: Payer: Self-pay

## 2012-10-15 DIAGNOSIS — R0602 Shortness of breath: Secondary | ICD-10-CM

## 2012-10-15 NOTE — Telephone Encounter (Signed)
I received t/c from [place] at [facility] She is a Engineer, civil (consulting) in their clinic Says pt drove himself there this am with c/o worsening sob and orthopnea since saturday Better when he is ambulating No edema on exam, per ashley, lungs clear in all fields BP=152/62, HR=86 BPM in atrial fib She thinks pt should be seen today  I had her hold while I discussed with Dr. Mariah Milling He advised "Get BNP, BMP today. Have pt take extra metolazone today. We will call him with lab results and further instructions" VO Dr. Alvis Lemmings, RN Morrie Sheldon was informed of plan She will inform pt and have him come here for labs today

## 2012-10-16 ENCOUNTER — Telehealth: Payer: Self-pay

## 2012-10-16 LAB — BASIC METABOLIC PANEL
BUN/Creatinine Ratio: 19 (ref 10–22)
BUN: 24 mg/dL (ref 10–36)
CO2: 27 mmol/L (ref 19–28)
Chloride: 90 mmol/L — ABNORMAL LOW (ref 97–108)
GFR calc Af Amer: 56 mL/min/{1.73_m2} — ABNORMAL LOW (ref >59)
Potassium: 4.6 mmol/L (ref 3.5–5.2)
Sodium: 129 mmol/L — ABNORMAL LOW (ref 134–144)

## 2012-10-16 NOTE — Telephone Encounter (Signed)
Labs appeared to show he has extra fluid, BNP more than 1000 Would take extra metolazone twice a day until edema improves May need twice a day dosing for 3 or 4 days and then extra dose as needed We'll probably need office followup

## 2012-10-16 NOTE — Telephone Encounter (Signed)
Please review and advise He took an extra metolazone yesterday See telephone note from 3/31

## 2012-10-16 NOTE — Telephone Encounter (Signed)
Pt would like lab results from yesterday. 

## 2012-10-17 NOTE — Telephone Encounter (Signed)
Pt informed Says he has taken metolazone qd over the last few days and feels "weak" Does not weigh himself daily BP=150systolic, HR=70's Has caregiver with him now I advised against taking metolazone since he is feeling weak He has lasix 80 mg tablets he has taken PRN in the past I advised to try 1/2 tablet daily until we see him on Friday 4/4 He and caregiver both verb understanding Caregiver mentions pt is not drinking enough fluid I advised him to drink more fluid (preferably water), but to not drink too much Understanding verb continues to report no LE edema, says SOB improves with ambulation

## 2012-10-19 ENCOUNTER — Encounter: Payer: Self-pay | Admitting: Cardiovascular Disease

## 2012-10-19 ENCOUNTER — Ambulatory Visit (INDEPENDENT_AMBULATORY_CARE_PROVIDER_SITE_OTHER): Payer: Medicare Other | Admitting: Cardiovascular Disease

## 2012-10-19 VITALS — BP 148/60 | HR 97 | Ht 67.0 in | Wt 141.8 lb

## 2012-10-19 DIAGNOSIS — E785 Hyperlipidemia, unspecified: Secondary | ICD-10-CM

## 2012-10-19 DIAGNOSIS — I10 Essential (primary) hypertension: Secondary | ICD-10-CM

## 2012-10-19 DIAGNOSIS — I509 Heart failure, unspecified: Secondary | ICD-10-CM

## 2012-10-19 DIAGNOSIS — I25810 Atherosclerosis of coronary artery bypass graft(s) without angina pectoris: Secondary | ICD-10-CM

## 2012-10-19 DIAGNOSIS — I4891 Unspecified atrial fibrillation: Secondary | ICD-10-CM

## 2012-10-19 DIAGNOSIS — R609 Edema, unspecified: Secondary | ICD-10-CM

## 2012-10-19 DIAGNOSIS — I2510 Atherosclerotic heart disease of native coronary artery without angina pectoris: Secondary | ICD-10-CM

## 2012-10-19 DIAGNOSIS — I5032 Chronic diastolic (congestive) heart failure: Secondary | ICD-10-CM
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
## 2012-10-19 NOTE — Assessment & Plan Note (Signed)
Worsening diastolic heart failure on today's visit. He has edema and worsening right pleural effusion. We have suggested he take Lasix 80 mg twice a day for several days until edema has improved then take 80 mg daily. Once he has significantly improved, back to Lasix 40 mg daily.

## 2012-10-19 NOTE — Patient Instructions (Addendum)
Please take extra atenolol 1/2 or whole pill in the AM for fast heart rate,  in addition to atenolol 25 mg in the PM  Take lasix 80 mg twice a day for a few days, for edema and shortness of breath Then back to lasix 80 mg in the Am Once edema improved, decrease lasix back to 40 mg daily  Please call us if you have new issues that need to be addressed before your next appt.  Your physician wants you to follow-up in: 1 month.

## 2012-10-19 NOTE — Assessment & Plan Note (Signed)
Rate is elevated in clinic today. We have suggested he take atenolol 25 milligrams by mouth twice a day

## 2012-10-19 NOTE — Assessment & Plan Note (Signed)
Cholesterol is at goal on the current lipid regimen. No changes to the medications were made.  

## 2012-10-19 NOTE — Assessment & Plan Note (Signed)
Currently with no symptoms of angina. No further workup at this time. Continue current medication regimen. 

## 2012-10-19 NOTE — Assessment & Plan Note (Signed)
2+ pitting edema from acute on chronic diastolic CHF. Increase Lasix as above

## 2012-10-19 NOTE — Assessment & Plan Note (Signed)
Pleural effusion again present on the right likely causing his worsening shortness of breath. Need to increase his diuretic.

## 2012-10-25 ENCOUNTER — Telehealth: Payer: Self-pay

## 2012-10-25 NOTE — Telephone Encounter (Signed)
Pt wanted to let us know his weight is down to 134 pounds, although he is unsure as to what weight was when he was "wet" Says sob and edema have improved Weakness has improved He is taking lasix 40 mg daily as instructed at last OV Says he is going to rehab as well  I will make Dr. Mariah Milling aware and call pt with any changes that need to be made

## 2012-10-25 NOTE — Telephone Encounter (Signed)
Pt wanted to let Dr Mariah Milling know he has lost weight is down to 134, and his SOB has subsided.

## 2012-10-26 NOTE — Telephone Encounter (Signed)
Okay to take Lasix 40 mg daily but would watch weight closely If weight starts to creep back up, would go back to higher dose Lasix until weight goes back down

## 2012-10-29 ENCOUNTER — Ambulatory Visit (INDEPENDENT_AMBULATORY_CARE_PROVIDER_SITE_OTHER): Payer: Medicare Other | Admitting: General Practice

## 2012-10-29 ENCOUNTER — Other Ambulatory Visit: Payer: Self-pay | Admitting: Cardiovascular Disease

## 2012-10-29 DIAGNOSIS — I4891 Unspecified atrial fibrillation: Secondary | ICD-10-CM

## 2012-10-29 DIAGNOSIS — Z7901 Long term (current) use of anticoagulants: Secondary | ICD-10-CM

## 2012-10-29 LAB — POCT INR: INR: 2.1

## 2012-10-29 NOTE — Patient Instructions (Signed)
Take 1 tablet every day and re-check in 4 weeks. 

## 2012-10-29 NOTE — Telephone Encounter (Signed)
Pt informed

## 2012-11-26 ENCOUNTER — Ambulatory Visit (INDEPENDENT_AMBULATORY_CARE_PROVIDER_SITE_OTHER): Payer: Medicare Other | Admitting: General Practice

## 2012-11-26 DIAGNOSIS — Z7901 Long term (current) use of anticoagulants: Secondary | ICD-10-CM

## 2012-11-26 DIAGNOSIS — Z5181 Encounter for therapeutic drug level monitoring: Secondary | ICD-10-CM

## 2012-11-26 DIAGNOSIS — I4891 Unspecified atrial fibrillation: Secondary | ICD-10-CM

## 2012-11-26 LAB — POCT INR: INR: 3.8

## 2012-11-26 NOTE — Patient Instructions (Signed)
Skip coumadin today (5/12) and then change dosage to 1 tablet every day except take 1/2 tablet on Monday.  and re-check in 4 weeks.

## 2012-11-29 ENCOUNTER — Telehealth: Payer: Self-pay | Admitting: General Practice

## 2012-11-29 NOTE — Telephone Encounter (Signed)
Patient called to report nose bleed.  Patient states that nose has been bleeding on and off today but that not a lot at one time.  I asked patient if it was enough to soak a wash cloth and he said no, nowhere near that.  I instructed patient to keep an eye on it and to call me tomorrow if bleeding is still occuring. Instructed patient to hold coumadin today and resume regular dosage tomorrow.

## 2012-11-30 ENCOUNTER — Encounter: Payer: Self-pay | Admitting: Internal Medicine

## 2012-11-30 ENCOUNTER — Ambulatory Visit (INDEPENDENT_AMBULATORY_CARE_PROVIDER_SITE_OTHER): Payer: Medicare Other | Admitting: Internal Medicine

## 2012-11-30 VITALS — BP 120/60 | HR 72 | Temp 97.4°F | Ht 67.0 in | Wt 140.5 lb

## 2012-11-30 DIAGNOSIS — I4891 Unspecified atrial fibrillation: Secondary | ICD-10-CM

## 2012-11-30 DIAGNOSIS — E039 Hypothyroidism, unspecified: Secondary | ICD-10-CM

## 2012-11-30 DIAGNOSIS — F329 Major depressive disorder, single episode, unspecified: Secondary | ICD-10-CM

## 2012-11-30 DIAGNOSIS — I5032 Chronic diastolic (congestive) heart failure: Secondary | ICD-10-CM

## 2012-11-30 DIAGNOSIS — E1149 Type 2 diabetes mellitus with other diabetic neurological complication: Secondary | ICD-10-CM

## 2012-11-30 DIAGNOSIS — I509 Heart failure, unspecified: Secondary | ICD-10-CM

## 2012-11-30 LAB — CBC WITH DIFFERENTIAL/PLATELET
Basophils Absolute: 0 10*3/uL (ref 0.0–0.1)
Basophils Relative: 0 % (ref 0–1)
Eosinophils Absolute: 0.2 10*3/uL (ref 0.0–0.7)
Eosinophils Relative: 4 % (ref 0–5)
HCT: 33.9 % — ABNORMAL LOW (ref 39.0–52.0)
Lymphocytes Relative: 25 % (ref 12–46)
MCH: 31.3 pg (ref 26.0–34.0)
MCHC: 33.3 g/dL (ref 30.0–36.0)
MCV: 93.9 fL (ref 78.0–100.0)
Monocytes Absolute: 0.5 10*3/uL (ref 0.1–1.0)
RDW: 13.6 % (ref 11.5–15.5)

## 2012-11-30 MED ORDER — CITALOPRAM HYDROBROMIDE 10 MG PO TABS: 10.0000 mg | ORAL_TABLET | Freq: Every day | ORAL | Status: DC

## 2012-11-30 NOTE — Assessment & Plan Note (Signed)
Major mood change since my last visit Unhappy daily  Some anhedonia Will try low dose citalopram

## 2012-11-30 NOTE — Assessment & Plan Note (Signed)
Complains of the DOE Not adequately diuresed but reluctant to take the furosemide I think it is more somatization for his worsening

## 2012-11-30 NOTE — Patient Instructions (Signed)
Please try the citalopram for the depression. Let me know if you have any problems tolerating it.
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
## 2012-11-30 NOTE — Assessment & Plan Note (Signed)
Rate is normal but may be affecting cardiac function Now on the atenolol I would not change this for now

## 2012-11-30 NOTE — Assessment & Plan Note (Signed)
Will check A1c  Seems to have good control

## 2012-12-01 LAB — BASIC METABOLIC PANEL
BUN: 33 mg/dL — ABNORMAL HIGH (ref 6–23)
CO2: 34 mEq/L — ABNORMAL HIGH (ref 19–32)
Chloride: 93 mEq/L — ABNORMAL LOW (ref 96–112)
Creat: 1.35 mg/dL (ref 0.50–1.35)
Glucose, Bld: 227 mg/dL — ABNORMAL HIGH (ref 70–99)
Potassium: 4.4 mEq/L (ref 3.5–5.3)

## 2012-12-01 LAB — LIPID PANEL
Cholesterol: 120 mg/dL (ref 0–200)
HDL: 45 mg/dL (ref >39)
Triglycerides: 110 mg/dL (ref <150)

## 2012-12-01 LAB — HEPATIC FUNCTION PANEL
ALT: 29 U/L (ref 0–53)
Albumin: 3.7 g/dL (ref 3.5–5.2)
Indirect Bilirubin: 0.5 mg/dL (ref 0.0–0.9)
Total Protein: 7.3 g/dL (ref 6.0–8.3)

## 2012-12-01 LAB — HEMOGLOBIN A1C: Hgb A1c MFr Bld: 8.5 % — ABNORMAL HIGH (ref <5.7)

## 2012-12-07 ENCOUNTER — Telehealth: Payer: Self-pay

## 2012-12-07 ENCOUNTER — Emergency Department: Payer: Self-pay | Admitting: Emergency Medicine

## 2012-12-07 LAB — CBC
HCT: 33.8 % — ABNORMAL LOW (ref 40.0–52.0)
MCH: 32.3 pg (ref 26.0–34.0)
MCHC: 33.8 g/dL (ref 32.0–36.0)
RBC: 3.54 10*6/uL — ABNORMAL LOW (ref 4.40–5.90)
WBC: 5.4 10*3/uL (ref 3.8–10.6)

## 2012-12-07 LAB — BASIC METABOLIC PANEL
Anion Gap: 4 — ABNORMAL LOW (ref 7–16)
Calcium, Total: 8.7 mg/dL (ref 8.5–10.1)
Chloride: 90 mmol/L — ABNORMAL LOW (ref 98–107)
Co2: 32 mmol/L (ref 21–32)
Creatinine: 1.5 mg/dL — ABNORMAL HIGH (ref 0.60–1.30)
EGFR (Non-African Amer.): 40 — ABNORMAL LOW
Potassium: 4.4 mmol/L (ref 3.5–5.1)

## 2012-12-07 NOTE — Telephone Encounter (Signed)
I paged Dr. Mariah Milling, waited x 10 mins No response If pt still having active Chest discomfort, will suggest he go to ER

## 2012-12-07 NOTE — Telephone Encounter (Signed)
Dr Clemens Catholic with [facility] ED requested to speak with Dr Alphonsus Sias; Dr Alphonsus Sias not in office and Dr Milinda Antis spoke with ED doctor.

## 2012-12-07 NOTE — Telephone Encounter (Signed)
Pt still c/o active chest discomfort associated with worsening sob and dizziness. Has sig. Past cardiac hx I advised he go to ER with these active symptoms understanding verb Also has appt with [place] 5/27 He will keep this appt

## 2012-12-07 NOTE — Telephone Encounter (Signed)
Pt called to report "I feel terrible" He goes on to say he has been feeling bad x 2 weeks C/o dry throat, dizziness, palpitations and some chest discomfort States "legs dont work very well" He goes on to says "I just dont have a will to live. I would be better off on the other side" He denies thoughts/plans to harm himself/others Lives at home with his wife Was seen by PCP last week He would like to see Dr. Mariah Milling today but I explained he is not in office today I told him I would page Dr. Mariah Milling to get his advice then I will call him back at [phone] Understanding verb

## 2012-12-07 NOTE — Telephone Encounter (Signed)
I spoke to the physician at [facility]- Dylan Perkins was there with worsening depression after intolerance of celexa No suicidal ideation but he was not sleeping well/ having panic attacks/ lost appetite and sleeping poorly She changed him to smal ldose of remeron 7.5 mg qhs and disc side eff with him He will try this and call for appt with Dr Alphonsus Sias next week

## 2012-12-08 NOTE — Telephone Encounter (Signed)
That sounds like a reasonable plan  Please check with him on Tuesday and get him an appt sometime towards the end of the week.

## 2012-12-11 ENCOUNTER — Ambulatory Visit (INDEPENDENT_AMBULATORY_CARE_PROVIDER_SITE_OTHER): Payer: Medicare Other | Admitting: Cardiovascular Disease

## 2012-12-11 ENCOUNTER — Encounter: Payer: Self-pay | Admitting: Cardiovascular Disease

## 2012-12-11 VITALS — BP 140/82 | HR 89 | Ht 68.0 in | Wt 145.0 lb

## 2012-12-11 DIAGNOSIS — I5032 Chronic diastolic (congestive) heart failure: Secondary | ICD-10-CM

## 2012-12-11 DIAGNOSIS — I4891 Unspecified atrial fibrillation: Secondary | ICD-10-CM

## 2012-12-11 DIAGNOSIS — I10 Essential (primary) hypertension: Secondary | ICD-10-CM

## 2012-12-11 DIAGNOSIS — I25810 Atherosclerosis of coronary artery bypass graft(s) without angina pectoris: Secondary | ICD-10-CM

## 2012-12-11 DIAGNOSIS — F329 Major depressive disorder, single episode, unspecified: Secondary | ICD-10-CM

## 2012-12-11 DIAGNOSIS — I509 Heart failure, unspecified: Secondary | ICD-10-CM

## 2012-12-11 NOTE — Assessment & Plan Note (Signed)
Pitting edema up into the thighs. We have encouraged him to take extra Lasix, decrease his fluid intake. Ideal weight is in the low 140 range. Weight in the 130s likely associated with renal dysfunction.

## 2012-12-11 NOTE — Assessment & Plan Note (Signed)
Rate is mildly elevated though stable. He is uncertain how much atenolol he is taking as he did not bring his medications with him today. I asked him to monitor his heart rate at home. Worsening shortness rest will likely cause worsening heart rate control. Doing well on warfarin.

## 2012-12-11 NOTE — Telephone Encounter (Signed)
Patient scheduled appointment on 12/14/12.

## 2012-12-11 NOTE — Patient Instructions (Addendum)
You are doing well.  For sleep, cut the mirtazapine in 1/2  Try to keep your weight in the low 140 range  Please call us if you have new issues that need to be addressed before your next appt.  Your physician wants you to follow-up in: 3 months.  You will receive a reminder letter in the mail two months in advance. If you don't receive a letter, please call our office to schedule the follow-up appointment.

## 2012-12-11 NOTE — Assessment & Plan Note (Signed)
Currently with no symptoms of angina. No further workup at this time. Continue current medication regimen. 

## 2012-12-11 NOTE — Assessment & Plan Note (Signed)
Blood pressure is well controlled on today's visit. No changes made to the medications. 

## 2012-12-11 NOTE — Assessment & Plan Note (Signed)
He was unable to tolerate Celexa after 2 doses. He was given Remeron/mirtazapine by the ER. This seemed to make him excessively sleepy. I suggested he cut the dose in half and take as before bed and talk with Dr. Alphonsus Sias at the end of this week when he sees him in clinic.

## 2012-12-11 NOTE — Progress Notes (Signed)
Patient ID: Dylan Perkins, male    DOB: 08/12/1917, 79 y.o.   MRN: [id number]  HPI Comments: 79 yo WM with history of DM, HTN, hyperlipidemia, CAD s/p CABG and redo, s/p porcine AVR with moderate residual stenosis, chronic diastolic CHF ,  chronic atrial fibrillation  on coumadin therapy,   intolerance of statins, who presents for routine followup. H/o clot to his right eye " that has caused significant vision loss on the right.  Previous  Echo showing  pulmonary HTN.   He reports that his weight has been slowly rising. His back hurts, he is depressed, is not doing much. His wife is frustrated. He has had back pain for 3 years and this has been getting worse with radiation around the front of his body. His sleep is poor. He has tried Celexa and had one dose one evening, dose the next morning and felt like it "knocked him out". He has not taken more Celexa since that time.   Went to the emergency room several days ago for shortness of breath.  X-ray showed pleural effusion is small to moderate. He reports that he has "had this before" .  He was given Remeron for sleep. He started this and felt very sleepy the next day.  Not taking Lasix aggressively to keep his weight down. Wife reports that he is more active with weight in the 130s like it was last year. At that time his renal function was worse.  Weight at home is 145-146, similar to weight in the office today.  Edema is extending up into his thighs. He does have more Lasix as well as metolazone at home. Feels that his edema is stable  He had a  echocardiogram for worsening edema on 07/28/2011 Echocardiogram showed severe pulmonary hypertension, moderate aortic valve stenosis of his bioprosthetic valve, mildly dilated left atrium  EKG shows atrial fibrillation with ventricular rate 89  beats per minute, intraventricular conduction delay, old anteroseptal infarct, left axis deviation/LAFB     Outpatient Encounter Prescriptions as of 12/11/2012   Medication Sig Dispense Refill  . alum & mag hydroxide-simeth (MYLANTA) 200-200-20 MG/5ML suspension Take by mouth as needed.        Marland Kitchen aspirin 81 MG tablet Take 81 mg by mouth every other day.        Marland Kitchen atenolol (TENORMIN) 25 MG tablet Take one and half tablet daily.      . calcium carbonate (TUMS) 500 MG chewable tablet Chew 1 tablet by mouth daily as needed.       . docusate sodium (COLACE) 100 MG capsule Take 300 mg by mouth daily.       . ferrous sulfate 325 (65 FE) MG tablet Take 325 mg by mouth daily as needed.       . furosemide (LASIX) 80 MG tablet Take 80 mg by mouth daily.       Marland Kitchen glipiZIDE (GLUCOTROL) 10 MG tablet TAKE 1 TABLET TWICE DAILY  60 tablet  9  . Glucose Blood (BAYER BREEZE 2 TEST) DISK TEST UP TO 4 TIMES PER DAY, dx: 250.00  100 each  6  . ipratropium (ATROVENT) 0.03 % nasal spray Place 2 sprays into the nose every 12 (twelve) hours as needed.  30 mL  1  . isosorbide mononitrate (IMDUR) 30 MG 24 hr tablet TAKE 1 TABLET (30 MG TOTAL) BY MOUTH DAILY.  30 tablet  11  . levothyroxine (SYNTHROID, LEVOTHROID) 50 MCG tablet Take 25 mcg by mouth daily.      Marland Kitchen  lidocaine (LIDODERM) 5 % 1-2 patches as needed to back. Wear up to 12 hours a day  30 patch  3  . losartan (COZAAR) 25 MG tablet TAKE 1 TABLET (25 MG TOTAL) BY MOUTH DAILY.  90 tablet  4  . Multiple Vitamin (MULTIVITAMIN) tablet Take 1 tablet by mouth daily after breakfast.        . ranitidine (ZANTAC) 150 MG capsule Take 150 mg by mouth daily as needed.       . traMADol (ULTRAM) 50 MG tablet Take 1 tablet (50 mg total) by mouth every 8 (eight) hours as needed for pain.  30 tablet  0  . warfarin (COUMADIN) 2.5 MG tablet Take 2.5 mg by mouth daily.       Review of Systems  Constitutional: Negative.   HENT: Negative.   Eyes: Negative.   Respiratory: Positive for shortness of breath.   Cardiovascular: Positive for leg swelling.  Gastrointestinal: Negative.   Musculoskeletal: Positive for gait problem.  Skin: Negative.    Neurological: Negative.   Psychiatric/Behavioral: Positive for sleep disturbance.  All other systems reviewed and are negative.    BP 140/82  Pulse 89  Ht 5\' 8"  (1.727 m)  Wt 145 lb (65.772 kg)  BMI 22.05 kg/m2  Physical Exam  Nursing note and vitals reviewed. Constitutional: He is oriented to person, place, and time. He appears well-developed and well-nourished.  HENT:  Head: Normocephalic.  Nose: Nose normal.  Mouth/Throat: Oropharynx is clear and moist.  Eyes: Conjunctivae are normal. Pupils are equal, round, and reactive to light.  Neck: Normal range of motion. Neck supple. No JVD present.  Cardiovascular: Normal rate, S1 normal, S2 normal and intact distal pulses.  An irregularly irregular rhythm present. Exam reveals no gallop and no friction rub.   Murmur heard.  Crescendo systolic murmur is present with a grade of 2/6  1-2+ pitting edema.   Pulmonary/Chest: Effort normal. No respiratory distress. He has decreased breath sounds in the right lower field. He has no wheezes. He has no rales. He exhibits no tenderness.  Abdominal: Soft. Bowel sounds are normal. He exhibits no distension. There is no tenderness.  Musculoskeletal: Normal range of motion. He exhibits no edema and no tenderness.  Lymphadenopathy:    He has no cervical adenopathy.  Neurological: He is alert and oriented to person, place, and time. Coordination normal.  Skin: Skin is warm and dry. No rash noted. No erythema.  Psychiatric: He has a normal mood and affect. His behavior is normal. Judgment and thought content normal.      Assessment and Plan

## 2012-12-14 ENCOUNTER — Ambulatory Visit (INDEPENDENT_AMBULATORY_CARE_PROVIDER_SITE_OTHER): Payer: Medicare Other | Admitting: Internal Medicine

## 2012-12-14 ENCOUNTER — Encounter: Payer: Self-pay | Admitting: Internal Medicine

## 2012-12-14 VITALS — BP 130/68 | HR 78 | Temp 98.2°F | Wt 143.0 lb

## 2012-12-14 DIAGNOSIS — F329 Major depressive disorder, single episode, unspecified: Secondary | ICD-10-CM

## 2012-12-14 DIAGNOSIS — I509 Heart failure, unspecified: Secondary | ICD-10-CM

## 2012-12-14 DIAGNOSIS — I5032 Chronic diastolic (congestive) heart failure: Secondary | ICD-10-CM

## 2012-12-14 DIAGNOSIS — M19019 Primary osteoarthritis, unspecified shoulder: Secondary | ICD-10-CM

## 2012-12-14 MED ORDER — METOLAZONE 2.5 MG PO TABS: 2.5000 mg | ORAL_TABLET | Freq: Every day | ORAL | Status: DC

## 2012-12-14 NOTE — Assessment & Plan Note (Signed)
Thinks the tramadol knocked him out also Recommended acetaminophen

## 2012-12-14 NOTE — Assessment & Plan Note (Signed)
Mood seems better Hasn't really tried 2 different meds but may be best to hold off on anything else (not likely to tolerate and is some better with getting out more)

## 2012-12-14 NOTE — Assessment & Plan Note (Signed)
More DOE BID furosemide not really diuresing him Will add metolozone 2.5 daily, 2-3 days per week

## 2012-12-14 NOTE — Patient Instructions (Addendum)
Please restart the metolozone.  Take a 2.5mg  tab with your first furosemide tablet. Don't use more than 2-3 times per week  Please start acetaminophen extended release 650mg  three times every day.

## 2012-12-14 NOTE — Progress Notes (Signed)
Subjective:    Patient ID: Dylan Perkins, male    DOB: 01-25-1918, 79 y.o.   MRN: [id number]  HPI Here with wife  Felt the citalopram "knocked me out" Didn't tolerate the mirtazapine the ER gave him It also worsened sedation  ER visit--Dr Mariah Milling reviewed Has (chronic) pleural effusion Breathing feels some better--- does get easy DOE May get some chest discomfort with exertion as well (even with bending over to get shoes on) Has been using the furosemide 80mg  --usually bid  Still feels down to some degree Wife feels he is somewhat better Wife is making him get out and walk outside at least 3 times per day Vision remains poor--this really affects his mood and enjoyment of life Has appt with retinal specialist today  Current Outpatient Prescriptions on File Prior to Visit  Medication Sig Dispense Refill  . alum & mag hydroxide-simeth (MYLANTA) 200-200-20 MG/5ML suspension Take by mouth as needed.        Marland Kitchen aspirin 81 MG tablet Take 81 mg by mouth every other day.       Marland Kitchen atenolol (TENORMIN) 25 MG tablet Take 37.5 mg by mouth daily.       . calcium carbonate (TUMS) 500 MG chewable tablet Chew 1 tablet by mouth daily as needed.       . docusate sodium (COLACE) 100 MG capsule Take 300 mg by mouth daily.       . ferrous sulfate 325 (65 FE) MG tablet Take 325 mg by mouth daily as needed.       . furosemide (LASIX) 80 MG tablet Take 80 mg by mouth daily.       Marland Kitchen glipiZIDE (GLUCOTROL) 10 MG tablet TAKE 1 TABLET TWICE DAILY  60 tablet  9  . Glucose Blood (BAYER BREEZE 2 TEST) DISK TEST UP TO 4 TIMES PER DAY, dx: 250.00  100 each  6  . ipratropium (ATROVENT) 0.03 % nasal spray Place 2 sprays into the nose every 12 (twelve) hours as needed.  30 mL  1  . isosorbide mononitrate (IMDUR) 30 MG 24 hr tablet TAKE 1 TABLET (30 MG TOTAL) BY MOUTH DAILY.  30 tablet  11  . levothyroxine (SYNTHROID, LEVOTHROID) 50 MCG tablet Take 25 mcg by mouth daily.      Marland Kitchen lidocaine (LIDODERM) 5 % 1-2 patches as needed  to back. Wear up to 12 hours a day  30 patch  3  . losartan (COZAAR) 25 MG tablet TAKE 1 TABLET (25 MG TOTAL) BY MOUTH DAILY.  90 tablet  4  . Multiple Vitamin (MULTIVITAMIN) tablet Take 1 tablet by mouth daily after breakfast.        . ranitidine (ZANTAC) 150 MG capsule Take 150 mg by mouth daily as needed.       . traMADol (ULTRAM) 50 MG tablet Take 1 tablet (50 mg total) by mouth every 8 (eight) hours as needed for pain.  30 tablet  0  . warfarin (COUMADIN) 2.5 MG tablet Take 2.5 mg by mouth daily.       No current facility-administered medications on file prior to visit.    Allergies  Allergen Reactions  . Lisinopril     REACTION: cough    Past Medical History  Diagnosis Date  . Allergic rhinitis   . Anemia     NOS  . CAD (coronary artery disease)   . DM II (diabetes mellitus, type II), controlled   . HLD (hyperlipidemia)   . HTN (hypertension)   .  OA (osteoarthritis)   . BPH (benign prostatic hypertrophy)   . Diverticulosis of colon     with repeated bleeds  . Atrial fibrillation or flutter 11/09  . Depression   . CHF (congestive heart failure)   . Thyroid disease   . GI bleed 2013    Non determined  . Pulmonary hypertension 2013    On Echo 07/2011  . Aortic stenosis   . Heart murmur     Past Surgical History  Procedure Laterality Date  . Tonsillectomy and adenoidectomy  1933  . Coronary artery bypass graft  1995  . Coronary artery bypass graft  1999    Redo  . Hemorrhoid surgery  1974  . Transurethral resection of prostate  1993  . Nasal stenosis repair  1996  . Aortic valve replacement  1999    pig valve  . Total shoulder replacement  1992    right  . Retinal vitrectomy      Left  . Cataract extraction  1999  . Carcinoma      head    Family History  Problem Relation Age of Onset  . Cancer Mother     (male)  . Parkinsonism Maternal Grandfather   . Mental retardation Son   . Colon cancer Neg Hx     History   Social History  . Marital  Status: Married    Spouse Name: N/A    Number of Children: 5  . Years of Education: N/A   Occupational History  . 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 Smoking status: Former Smoker    Quit date: 07/18/1950  . Smokeless tobacco: Never Used  . Alcohol Use: Yes     Comment: one daily   . Drug Use: No  . Sexually Active: Not on file   Other Topics Concern  . Not on file   Social History Narrative   Has living will   Wife has health care POA---then son, Weston Brass   Requests DNR and order done   Would not want a feeding tube   Daily caffeine    Review of Systems Feels cold---even when hot out. Appetite is variable---okay in general Still not sleeping well--but spends more time in bed Ongoing back and shoulder pain--some help with heat. Hasn't been using the pain pill    Objective:   Physical Exam  Constitutional: He appears well-developed and well-nourished. No distress.  Neck: Normal range of motion.  Cardiovascular: Normal rate.  Exam reveals no gallop.   irregular  Pulmonary/Chest: Effort normal. No respiratory distress.  Decreased breath sounds at right base Fine basilar crackles on left  Musculoskeletal: He exhibits edema.  2+ reedy edema  Lymphadenopathy:    He has no cervical adenopathy.  Psychiatric:  Seems brighter today          Assessment & Plan:

## 2012-12-16 ENCOUNTER — Other Ambulatory Visit: Payer: Self-pay | Admitting: Cardiovascular Disease

## 2012-12-17 ENCOUNTER — Other Ambulatory Visit: Payer: Self-pay | Admitting: *Deleted

## 2012-12-17 MED ORDER — FUROSEMIDE 80 MG PO TABS: 80.0000 mg | ORAL_TABLET | Freq: Every day | ORAL | Status: DC

## 2012-12-17 NOTE — Telephone Encounter (Signed)
Refilled Furosemide sent to [facility] pharmacy. 

## 2012-12-24 ENCOUNTER — Ambulatory Visit (INDEPENDENT_AMBULATORY_CARE_PROVIDER_SITE_OTHER): Payer: Medicare Other | Admitting: General Practice

## 2012-12-24 DIAGNOSIS — Z7901 Long term (current) use of anticoagulants: Secondary | ICD-10-CM

## 2012-12-24 DIAGNOSIS — I4891 Unspecified atrial fibrillation: Secondary | ICD-10-CM

## 2012-12-24 DIAGNOSIS — Z5181 Encounter for therapeutic drug level monitoring: Secondary | ICD-10-CM

## 2012-12-24 NOTE — Patient Instructions (Signed)
Continue to take 1 tablet every day except take 1/2 tablet on Monday.  Re-check in 4 weeks.

## 2012-12-31 ENCOUNTER — Other Ambulatory Visit: Payer: Self-pay | Admitting: Cardiovascular Disease

## 2012-12-31 ENCOUNTER — Other Ambulatory Visit: Payer: Self-pay | Admitting: Internal Medicine

## 2012-12-31 NOTE — Telephone Encounter (Signed)
Refilled Atenolol sent to [facility] pharmacy.

## 2013-01-09 ENCOUNTER — Encounter: Payer: Self-pay | Admitting: Internal Medicine

## 2013-01-09 ENCOUNTER — Ambulatory Visit (INDEPENDENT_AMBULATORY_CARE_PROVIDER_SITE_OTHER): Payer: Medicare Other | Admitting: Internal Medicine

## 2013-01-09 VITALS — BP 110/60 | HR 68 | Temp 98.3°F | Wt 139.0 lb

## 2013-01-09 DIAGNOSIS — E1149 Type 2 diabetes mellitus with other diabetic neurological complication: Secondary | ICD-10-CM

## 2013-01-09 DIAGNOSIS — F329 Major depressive disorder, single episode, unspecified: Secondary | ICD-10-CM

## 2013-01-09 DIAGNOSIS — I5032 Chronic diastolic (congestive) heart failure: Secondary | ICD-10-CM

## 2013-01-09 DIAGNOSIS — I509 Heart failure, unspecified: Secondary | ICD-10-CM

## 2013-01-09 DIAGNOSIS — I4891 Unspecified atrial fibrillation: Secondary | ICD-10-CM

## 2013-01-09 MED ORDER — ISOSORBIDE MONONITRATE ER 30 MG PO TB24: 30.0000 mg | ORAL_TABLET | Freq: Every day | ORAL | Status: AC

## 2013-01-09 NOTE — Assessment & Plan Note (Signed)
Mood seems okay but had hard time with the nosebleeds No meds still

## 2013-01-09 NOTE — Assessment & Plan Note (Signed)
Weight is down but still with effusion and edema is still present No change for now

## 2013-01-09 NOTE — Assessment & Plan Note (Signed)
Off the coumadin and aspirin Discussed pros and cons of coumadin He wants to stay off Will have him restart the aspirin though

## 2013-01-09 NOTE — Assessment & Plan Note (Signed)
Lab Results  Component Value Date   HGBA1C 8.5* 11/30/2012   Sugars seem some better now No changes

## 2013-01-09 NOTE — Progress Notes (Signed)
Subjective:    Patient ID: Dylan Perkins, male    DOB: [birth year], 79 y.o.   MRN: [id number]  HPI Here with wife  Feels "blurry" this morning Has had a week of trouble with nosebleeds Had packing for about a week Seeing Dr Claria Dice the coumadin due to this-- just over a week ago Stopped the aspirin  Mood has been off due to the nosebleed and all Not able to do his normal activities---only just went back to his exercise class today  Has lost a few pounds On the metolozone up to three times a week Breathing may be somewhat better----hard to tell due to the plug up his nose and need to mouth breathe Edema seems more prominent again despite the weight loss  Sugars have been okay  119-158 fasting  Current Outpatient Prescriptions on File Prior to Visit  Medication Sig Dispense Refill  . alum & mag hydroxide-simeth (MYLANTA) 200-200-20 MG/5ML suspension Take by mouth as needed.        . calcium carbonate (TUMS) 500 MG chewable tablet Chew 1 tablet by mouth daily as needed.       . docusate sodium (COLACE) 100 MG capsule Take 300 mg by mouth daily.       . ferrous sulfate 325 (65 FE) MG tablet Take 325 mg by mouth daily as needed.       Marland Kitchen glipiZIDE (GLUCOTROL) 10 MG tablet TAKE 1 TABLET TWICE DAILY  60 tablet  9  . Glucose Blood (BAYER BREEZE 2 TEST) DISK TEST UP TO 4 TIMES PER DAY, dx: 250.00  100 each  6  . isosorbide mononitrate (IMDUR) 30 MG 24 hr tablet TAKE 1 TABLET (30 MG TOTAL) BY MOUTH DAILY.  30 tablet  11  . levothyroxine (SYNTHROID, LEVOTHROID) 25 MCG tablet TAKE 1 TABLET EVERY DAY  90 tablet  3  . losartan (COZAAR) 25 MG tablet TAKE 1 TABLET (25 MG TOTAL) BY MOUTH DAILY.  90 tablet  4  . Multiple Vitamin (MULTIVITAMIN) tablet Take 1 tablet by mouth daily after breakfast.        . ranitidine (ZANTAC) 150 MG capsule Take 150 mg by mouth daily as needed.       . warfarin (COUMADIN) 2.5 MG tablet Take 2.5 mg by mouth daily.       No current facility-administered  medications on file prior to visit.    Allergies  Allergen Reactions  . Lisinopril     REACTION: cough    Past Medical History  Diagnosis Date  . Allergic rhinitis   . Anemia     NOS  . CAD (coronary artery disease)   . DM II (diabetes mellitus, type II), controlled   . HLD (hyperlipidemia)   . HTN (hypertension)   . OA (osteoarthritis)   . BPH (benign prostatic hypertrophy)   . Diverticulosis of colon     with repeated bleeds  . Atrial fibrillation or flutter 11/09  . Depression   . CHF (congestive heart failure)   . Thyroid disease   . GI bleed 2013    Non determined  . Pulmonary hypertension 2013    On Echo 07/2011  . Aortic stenosis   . Heart murmur     Past Surgical History  Procedure Laterality Date  . Tonsillectomy and adenoidectomy  1933  . Coronary artery bypass graft  1995  . Coronary artery bypass graft  1999    Redo  . Hemorrhoid surgery  1974  . Transurethral resection of prostate  1993  . Nasal stenosis repair  1996  . Aortic valve replacement  1999    pig valve  . Total shoulder replacement  1992    right  . Retinal vitrectomy      Left  . Cataract extraction  1999  . Carcinoma      head    Family History  Problem Relation Age of Onset  . Cancer Mother     (male)  . Parkinsonism Maternal Grandfather   . Mental retardation Son   . Colon cancer Neg Hx     History   Social History  . Marital Status: Married    Spouse Name: N/A    Number of Children: 5  . Years of Education: N/A   Occupational History  . 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 Smoking status: Former Smoker    Quit date: 07/18/1950  . Smokeless tobacco: Never Used  . Alcohol Use: Yes     Comment: one daily   . Drug Use: No  . Sexually Active: Not on file   Other Topics Concern  . Not on file   Social History Narrative   Has living will   Wife has health care POA---then son, Weston Brass   Requests DNR and order done   Would  not want a feeding tube   Daily caffeine    Review of Systems Sleeping some better Appetite is okay    Objective:   Physical Exam  Constitutional: He appears well-developed and well-nourished. No distress.  Neck: Normal range of motion.  Cardiovascular: Normal rate.  Exam reveals no gallop.   Murmur heard. iregular Faint systolic murmur  Pulmonary/Chest: Effort normal. No respiratory distress. He has no wheezes. He has no rales.  Dull at right base  Musculoskeletal: He exhibits edema.  2+ reedy edema  Lymphadenopathy:    He has no cervical adenopathy.  Psychiatric: He has a normal mood and affect. His behavior is normal.          Assessment & Plan:

## 2013-01-09 NOTE — Patient Instructions (Signed)
Please stay off the coumadin Restart enteric coated aspirin 81mg  daily

## 2013-01-09 NOTE — Addendum Note (Signed)
Addended by: Sueanne Margarita on: 01/09/2013 12:59 PM   Modules accepted: Orders

## 2013-01-17 ENCOUNTER — Telehealth: Payer: Self-pay | Admitting: Cardiovascular Disease

## 2013-01-17 ENCOUNTER — Telehealth: Payer: Self-pay | Admitting: *Deleted

## 2013-01-17 ENCOUNTER — Other Ambulatory Visit: Payer: Self-pay

## 2013-01-17 MED ORDER — TORSEMIDE 20 MG PO TABS: 40.0000 mg | ORAL_TABLET | Freq: Two times a day (BID) | ORAL | Status: DC

## 2013-01-17 NOTE — Telephone Encounter (Signed)
See below

## 2013-01-17 NOTE — Telephone Encounter (Signed)
Can we call to see how his edema is going? Can we document, how much Lasix he is taking, how much metolazone? Would ask for his home weight If he is taking Lasix twice a day or even once a day on a regular basis, and if having continued swelling, We could change the Lasix to torsemide  40 mg twice a day

## 2013-01-17 NOTE — Telephone Encounter (Signed)
Pt reports he still has le edema Is taking lasix 80 mg daily Metolazone 2.5 mg 3x week  i advised, per dr. Mariah Milling, to try changing to torsemide 40 mg po BID He will continue to weigh himself daily (baseline=140, today=139.2, yesterday=139.8) RX sent to pharmacy

## 2013-01-17 NOTE — Telephone Encounter (Signed)
Pt called stating his coumadin was discontinued on his office visit with Dr Velia Meyer  01/09/2013) and he wanted to have his coumadin appt cancelled. This appt cancelled as requested as he is no longer to take coumadin. Message sent to Bailey Mech Rn in coumadin clinic at [facility].

## 2013-01-21 ENCOUNTER — Telehealth: Payer: Self-pay | Admitting: Family Medicine

## 2013-01-21 ENCOUNTER — Ambulatory Visit: Payer: Medicare Other

## 2013-01-22 NOTE — Telephone Encounter (Signed)
Opened in error

## 2013-01-23 NOTE — Telephone Encounter (Signed)
Can we put him on the schedule for a few weeks from now

## 2013-01-23 NOTE — Telephone Encounter (Signed)
Please schedule

## 2013-01-24 ENCOUNTER — Other Ambulatory Visit: Payer: Self-pay

## 2013-01-24 ENCOUNTER — Telehealth: Payer: Self-pay

## 2013-01-24 NOTE — Telephone Encounter (Signed)
lmtcb

## 2013-01-24 NOTE — Telephone Encounter (Signed)
Pt was changed to torsemide, states he does not think it is working. Pt is scheduled for 02/13/2013. Please call

## 2013-02-08 ENCOUNTER — Encounter: Payer: Self-pay | Admitting: Internal Medicine

## 2013-02-08 ENCOUNTER — Ambulatory Visit (INDEPENDENT_AMBULATORY_CARE_PROVIDER_SITE_OTHER): Payer: Medicare Other | Admitting: Internal Medicine

## 2013-02-08 VITALS — BP 110/60 | HR 60 | Temp 97.6°F | Wt 146.0 lb

## 2013-02-08 DIAGNOSIS — I4891 Unspecified atrial fibrillation: Secondary | ICD-10-CM

## 2013-02-08 DIAGNOSIS — I5032 Chronic diastolic (congestive) heart failure: Secondary | ICD-10-CM

## 2013-02-08 DIAGNOSIS — F329 Major depressive disorder, single episode, unspecified: Secondary | ICD-10-CM

## 2013-02-08 DIAGNOSIS — F3289 Other specified depressive episodes: Secondary | ICD-10-CM

## 2013-02-08 DIAGNOSIS — I509 Heart failure, unspecified: Secondary | ICD-10-CM

## 2013-02-08 NOTE — Assessment & Plan Note (Signed)
Weight up some due to errors with diuretic dosing Heading back down Still with right effusion They will monitor Dry weight at home ~135#

## 2013-02-08 NOTE — Assessment & Plan Note (Signed)
Rate is fine Ecchymotic areas on skin all over---just on asa every other day. Will hold off on coumadin

## 2013-02-08 NOTE — Patient Instructions (Signed)
Your goal weight at home is about 135#. You should let me know if you can't get back to that.

## 2013-02-08 NOTE — Progress Notes (Signed)
Subjective:    Patient ID: Dylan Perkins, male    DOB: 13-Apr-1918, 79 y.o.   MRN: [id number]  HPI Here with wife Feels some better today Did get messed up with the diuretic dosing---weight had been up even more (up to 146# at home) Now back to 141# at home Dry weight probably closer to 135#  Breathing is still short Fine at rest and in bed DOE even getting dressed and putting on shoes---this is baseline Sleeps in chair Some edema still  Checks sugars daily 113-197 Mostly under 140-145 No hypoglycemia  Mood is up and down Able to forget about the bad days--gets bored  And hot weather is limiting Some degree of anhedonia Enjoys music---soothes him at bedtime  Current Outpatient Prescriptions on File Prior to Visit  Medication Sig Dispense Refill  . alum & mag hydroxide-simeth (MYLANTA) 200-200-20 MG/5ML suspension Take by mouth as needed.        Marland Kitchen aspirin EC 81 MG tablet Take 81 mg by mouth daily.      Marland Kitchen atenolol (TENORMIN) 25 MG tablet Take 37.5 mg by mouth daily.      . calcium carbonate (TUMS) 500 MG chewable tablet Chew 1 tablet by mouth daily as needed.       . docusate sodium (COLACE) 100 MG capsule Take 300 mg by mouth daily.       . ferrous sulfate 325 (65 FE) MG tablet Take 325 mg by mouth daily as needed.       Marland Kitchen glipiZIDE (GLUCOTROL) 10 MG tablet TAKE 1 TABLET TWICE DAILY  60 tablet  9  . Glucose Blood (BAYER BREEZE 2 TEST) DISK TEST UP TO 4 TIMES PER DAY, dx: 250.00  100 each  6  . isosorbide mononitrate (IMDUR) 30 MG 24 hr tablet Take 1 tablet (30 mg total) by mouth daily.  90 tablet  3  . levothyroxine (SYNTHROID, LEVOTHROID) 25 MCG tablet TAKE 1 TABLET EVERY DAY  90 tablet  3  . losartan (COZAAR) 25 MG tablet TAKE 1 TABLET (25 MG TOTAL) BY MOUTH DAILY.  90 tablet  4  . metolazone (ZAROXOLYN) 2.5 MG tablet Take 2.5 mg by mouth 3 (three) times a week.      . Multiple Vitamin (MULTIVITAMIN) tablet Take 1 tablet by mouth daily after breakfast.        . ranitidine  (ZANTAC) 150 MG capsule Take 150 mg by mouth daily as needed.       . torsemide (DEMADEX) 20 MG tablet Take 2 tablets (40 mg total) by mouth 2 (two) times daily.  120 tablet  3   No current facility-administered medications on file prior to visit.    Allergies  Allergen Reactions  . Lisinopril     REACTION: cough    Past Medical History  Diagnosis Date  . Allergic rhinitis   . Anemia     NOS  . CAD (coronary artery disease)   . DM II (diabetes mellitus, type II), controlled   . HLD (hyperlipidemia)   . HTN (hypertension)   . OA (osteoarthritis)   . BPH (benign prostatic hypertrophy)   . Diverticulosis of colon     with repeated bleeds  . Atrial fibrillation or flutter 11/09  . Depression   . CHF (congestive heart failure)   . Thyroid disease   . GI bleed 2013    Non determined  . Pulmonary hypertension 2013    On Echo 07/2011  . Aortic stenosis   . Heart murmur  Past Surgical History  Procedure Laterality Date  . Tonsillectomy and adenoidectomy  1933  . Coronary artery bypass graft  1995  . Coronary artery bypass graft  1999    Redo  . Hemorrhoid surgery  1974  . Transurethral resection of prostate  1993  . Nasal stenosis repair  1996  . Aortic valve replacement  1999    pig valve  . Total shoulder replacement  1992    right  . Retinal vitrectomy      Left  . Cataract extraction  1999  . Carcinoma      head    Family History  Problem Relation Age of Onset  . Cancer Mother     (male)  . Parkinsonism Maternal Grandfather   . Mental retardation Son   . Colon cancer Neg Hx     History   Social History  . Marital Status: Married    Spouse Name: N/A    Number of Children: 5  . Years of Education: N/A   Occupational History  . 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 Smoking status: Former Smoker    Quit date: 07/18/1950  . Smokeless tobacco: Never Used  . Alcohol Use: Yes     Comment: one daily   . Drug  Use: No  . Sexually Active: Not on file   Other Topics Concern  . Not on file   Social History Narrative   Has living will   Wife has health care POA---then son, Weston Brass   Requests DNR and order done   Would not want a feeding tube   Daily caffeine    Review of Systems Appetite is about the same Going to exercise class 3 days per week    Objective:   Physical Exam  Constitutional: He appears well-developed. No distress.  Neck: Normal range of motion. Neck supple.  Cardiovascular: Normal rate.  Exam reveals no gallop.   Murmur heard. Irregular Gr 2/6 systolic murmur  Pulmonary/Chest: Effort normal. No respiratory distress. He has no wheezes. He has no rales.  Dull and decreased breath sounds at right base still  Musculoskeletal: He exhibits edema.  2+ tense edema in feet/up calves  Lymphadenopathy:    He has no cervical adenopathy.  Psychiatric: He has a normal mood and affect. His behavior is normal.          Assessment & Plan:

## 2013-02-08 NOTE — Assessment & Plan Note (Signed)
Not MDD Mood variable meds not indicated

## 2013-02-13 ENCOUNTER — Ambulatory Visit (INDEPENDENT_AMBULATORY_CARE_PROVIDER_SITE_OTHER): Payer: Medicare Other | Admitting: Cardiovascular Disease

## 2013-02-13 ENCOUNTER — Encounter: Payer: Self-pay | Admitting: Cardiovascular Disease

## 2013-02-13 VITALS — BP 108/62 | HR 88 | Ht 68.0 in | Wt 142.8 lb

## 2013-02-13 DIAGNOSIS — I25810 Atherosclerosis of coronary artery bypass graft(s) without angina pectoris: Secondary | ICD-10-CM

## 2013-02-13 DIAGNOSIS — I5032 Chronic diastolic (congestive) heart failure: Secondary | ICD-10-CM

## 2013-02-13 DIAGNOSIS — I509 Heart failure, unspecified: Secondary | ICD-10-CM

## 2013-02-13 DIAGNOSIS — R609 Edema, unspecified: Secondary | ICD-10-CM

## 2013-02-13 DIAGNOSIS — I4891 Unspecified atrial fibrillation: Secondary | ICD-10-CM

## 2013-02-13 NOTE — Progress Notes (Signed)
Patient ID: Dylan Perkins, male    DOB: Jun 01, 1918, 79 y.o.   MRN: [id number]  HPI Comments: 79 yo WM with history of DM, HTN, hyperlipidemia, CAD s/p CABG and redo, s/p porcine AVR with moderate residual stenosis, chronic diastolic CHF ,  chronic atrial fibrillation  on coumadin therapy,   intolerance of statins, who presents for routine followup. H/o clot to his right eye " that has caused significant vision loss on the right.  Previous  Echo showing  pulmonary HTN.   In followup today, weight is down since starting torsemide 40 mg twice a day. On this regimen, he is down 7 pounds in the past week. Legs continue to be swollen/edematous that he is feeling better. Less shortness of breath.  Previous x-ray showing pleural effusion on the right,  small to moderate.  He likes the torsemide better than Lasix, "works better". Weight at home has decreased from 146 pounds down to 138 pounds.  Warfarin was stopped, he reported significant nose bleed, bruising. It was his choice to stop warfarin and he does not want to restart anticoagulation.  He reports that he is aware of the risks and benefits. Aware that he could have stroke with atrial fibrillation.  He had a  echocardiogram for worsening edema on 07/28/2011 Echocardiogram showed severe pulmonary hypertension, moderate aortic valve stenosis of his bioprosthetic valve, mildly dilated left atrium  EKG shows atrial fibrillation with ventricular rate 88  beats per minute, intraventricular conduction delay, old anteroseptal infarct, left axis deviation/LAFB     Outpatient Encounter Prescriptions as of 02/13/2013  Medication Sig Dispense Refill  . alum & mag hydroxide-simeth (MYLANTA) 200-200-20 MG/5ML suspension Take by mouth as needed.        Marland Kitchen aspirin EC 81 MG tablet Take 81 mg by mouth every other day.       Marland Kitchen atenolol (TENORMIN) 25 MG tablet Take 37.5 mg by mouth daily.      . calcium carbonate (TUMS) 500 MG chewable tablet Chew 1 tablet by mouth  daily as needed.       . docusate sodium (COLACE) 100 MG capsule Take 300 mg by mouth daily.       Marland Kitchen glipiZIDE (GLUCOTROL) 10 MG tablet TAKE 1 TABLET TWICE DAILY  60 tablet  9  . Glucose Blood (BAYER BREEZE 2 TEST) DISK TEST UP TO 4 TIMES PER DAY, dx: 250.00  100 each  6  . isosorbide mononitrate (IMDUR) 30 MG 24 hr tablet Take 1 tablet (30 mg total) by mouth daily.  90 tablet  3  . levothyroxine (SYNTHROID, LEVOTHROID) 25 MCG tablet TAKE 1 TABLET EVERY DAY  90 tablet  3  . losartan (COZAAR) 25 MG tablet TAKE 1 TABLET (25 MG TOTAL) BY MOUTH DAILY.  90 tablet  4  . Multiple Vitamin (MULTIVITAMIN) tablet Take 1 tablet by mouth daily after breakfast.        . ranitidine (ZANTAC) 150 MG capsule Take 150 mg by mouth daily as needed.       . torsemide (DEMADEX) 20 MG tablet Take 2 tablets (40 mg total) by mouth 2 (two) times daily.  120 tablet  3    Review of Systems  Constitutional: Negative.   HENT: Negative.   Eyes: Negative.   Cardiovascular: Positive for leg swelling.  Gastrointestinal: Negative.   Musculoskeletal: Positive for gait problem.  Skin: Negative.   Neurological: Negative.   Psychiatric/Behavioral: Positive for sleep disturbance.  All other systems reviewed and are negative.  BP 108/62  Pulse 88  Ht 5\' 8"  (1.727 m)  Wt 142 lb 12 oz (64.751 kg)  BMI 21.71 kg/m2  Physical Exam  Nursing note and vitals reviewed. Constitutional: He is oriented to person, place, and time. He appears well-developed and well-nourished.  HENT:  Head: Normocephalic.  Nose: Nose normal.  Mouth/Throat: Oropharynx is clear and moist.  Eyes: Conjunctivae are normal. Pupils are equal, round, and reactive to light.  Neck: Normal range of motion. Neck supple. No JVD present.  Cardiovascular: Normal rate, S1 normal, S2 normal and intact distal pulses.  An irregularly irregular rhythm present. Exam reveals no gallop and no friction rub.   Murmur heard.  Crescendo systolic murmur is present with a  grade of 2/6  1-2+ pitting edema.   Pulmonary/Chest: Effort normal. No respiratory distress. He has decreased breath sounds in the right lower field. He has no wheezes. He has no rales. He exhibits no tenderness.  Abdominal: Soft. Bowel sounds are normal. He exhibits no distension. There is no tenderness.  Musculoskeletal: Normal range of motion. He exhibits no edema and no tenderness.  Lymphadenopathy:    He has no cervical adenopathy.  Neurological: He is alert and oriented to person, place, and time. Coordination normal.  Skin: Skin is warm and dry. No rash noted. No erythema.  Psychiatric: He has a normal mood and affect. His behavior is normal. Judgment and thought content normal.      Assessment and Plan

## 2013-02-13 NOTE — Assessment & Plan Note (Signed)
Improved lower stone edema, shortness of breath also better. We'll continue torsemide 40 mg twice a day. Once weight drops to the mid to low 130 range, will decrease his torsemide dosing. Details of how to do this were given to him in the instructions.

## 2013-02-13 NOTE — Assessment & Plan Note (Signed)
Currently with no symptoms of angina. No further workup at this time. Continue current medication regimen. 

## 2013-02-13 NOTE — Assessment & Plan Note (Signed)
Rate well controlled today. He is elected not to continue on warfarin. Long discussion today about risk and benefit.

## 2013-02-13 NOTE — Patient Instructions (Addendum)
You are doing well.  For weight greater than 134, take  torsemide 2 pills in the AM and pm For weight 132 to 133, take torsemide 2 pills in the AM For weight of 131 or less, take torsemide one pill once a day  Please call us if you have new issues that need to be addressed before your next appt.  Your physician wants you to follow-up in: 3 months.  You will receive a reminder letter in the mail two months in advance. If you don't receive a letter, please call our office to schedule the follow-up appointment.

## 2013-02-13 NOTE — Assessment & Plan Note (Signed)
Continued pitting edema on today's visit. We'll try to push his dry weight down an additional 5 pounds. Lab work done today

## 2013-02-14 LAB — BASIC METABOLIC PANEL
BUN/Creatinine Ratio: 24 — ABNORMAL HIGH (ref 10–22)
BUN: 37 mg/dL — ABNORMAL HIGH (ref 10–36)
CO2: 28 mmol/L (ref 18–29)
Chloride: 92 mmol/L — ABNORMAL LOW (ref 97–108)
Creatinine, Ser: 1.55 mg/dL — ABNORMAL HIGH (ref 0.76–1.27)
GFR calc Af Amer: 44 mL/min/{1.73_m2} — ABNORMAL LOW (ref >59)
Sodium: 135 mmol/L (ref 134–144)

## 2013-03-08 ENCOUNTER — Telehealth: Payer: Self-pay

## 2013-03-08 DIAGNOSIS — R609 Edema, unspecified: Secondary | ICD-10-CM

## 2013-03-08 DIAGNOSIS — I25810 Atherosclerosis of coronary artery bypass graft(s) without angina pectoris: Secondary | ICD-10-CM

## 2013-03-08 NOTE — Telephone Encounter (Signed)
Spoke w/ pt.  Per Dr. Windell Hummingbird lab note 7/31, instructed pt to increase torsemide to BID over the weekend, as his wt is up to 144 and his legs are swollen.  He has only been taking 20 mg daily, as he was confused about the "dose keeps changing".  His TED hose are too tight to get on, so he is going to the store today to get a pair that he can get on.  Pt is to call me on Monday to let me know how is doing.

## 2013-03-08 NOTE — Telephone Encounter (Signed)
Pt has a question regarding his torsemide

## 2013-03-11 ENCOUNTER — Other Ambulatory Visit: Payer: Self-pay

## 2013-03-11 MED ORDER — ATENOLOL 25 MG PO TABS: 37.5000 mg | ORAL_TABLET | Freq: Every day | ORAL | Status: AC

## 2013-03-11 NOTE — Telephone Encounter (Signed)
Patient was told to call regarding how he is doing. Patient has been taking his Torsemide as directed and is constipated. Can torsemide cause constipation?  Weight has gone from 144 lb to 141 lb and now went up to 143 lbs this am. The patient states, he was so dry yesterday and has been drinking a lot of water. The patient is having problems with getting his TED hose and needs a Rx for what type of compression the TED hose are for; please contact [facility] at ph # [phone] fax # is  [phone] and ask for [facility] The patient needs a refill for atenolol 25 mg 1 and half tablet daily; [facility] [address].  Please contact patient ASAP!

## 2013-03-11 NOTE — Telephone Encounter (Signed)
Refill sent for atenolol.  

## 2013-03-12 NOTE — Telephone Encounter (Signed)
Pt seen in office 02/13/13.

## 2013-03-12 NOTE — Telephone Encounter (Signed)
Spoke w/ pt.  Instructed him to reduce torsemide back down to once daily an to elevate legs if having swelling. Will fax new order for TED hose to [facility].  Pt to call if he has any other concerns.

## 2013-03-12 NOTE — Telephone Encounter (Signed)
Torsemide and other diuretics can cause constipation..  OK to reduce his dose back down to 1 a day ( or previous dose)  If he is feeling dehydrated.

## 2013-03-15 ENCOUNTER — Telehealth: Payer: Self-pay

## 2013-03-15 NOTE — Telephone Encounter (Signed)
I don't think that the levothyroxine usually causes dry mouth. It would be safe for him to try not taking it for a week. If the dry mouth eases, I can try a different thyroid med ([employer] thyroid) If the dry mouth isn't affected, he should just restart the levothyroxine and we will need to discuss our options and what might be causing the problem at an office visit

## 2013-03-15 NOTE — Telephone Encounter (Signed)
Instructed pt as directed.  Verbalized understanding.

## 2013-03-15 NOTE — Telephone Encounter (Signed)
Pt taking Levothyroxine 25 mcg; pt said levothyroxine causes dry mouth; pt said he is not to drink a lot of liquid due to CHF. Pt wants to know if  substitute med for Levothyroxine can be sent [facility] [address].Please advise.Pt request cb.

## 2013-03-19 ENCOUNTER — Ambulatory Visit (INDEPENDENT_AMBULATORY_CARE_PROVIDER_SITE_OTHER): Payer: Medicare Other | Admitting: Cardiovascular Disease

## 2013-03-19 ENCOUNTER — Encounter: Payer: Self-pay | Admitting: Cardiovascular Disease

## 2013-03-19 VITALS — BP 114/61 | HR 78 | Ht 68.0 in | Wt 148.8 lb

## 2013-03-19 DIAGNOSIS — R0602 Shortness of breath: Secondary | ICD-10-CM

## 2013-03-19 DIAGNOSIS — I208 Other forms of angina pectoris: Secondary | ICD-10-CM

## 2013-03-19 DIAGNOSIS — I2510 Atherosclerotic heart disease of native coronary artery without angina pectoris: Secondary | ICD-10-CM

## 2013-03-19 DIAGNOSIS — I272 Pulmonary hypertension, unspecified: Secondary | ICD-10-CM

## 2013-03-19 DIAGNOSIS — I5032 Chronic diastolic (congestive) heart failure: Secondary | ICD-10-CM

## 2013-03-19 DIAGNOSIS — I4891 Unspecified atrial fibrillation: Secondary | ICD-10-CM

## 2013-03-19 DIAGNOSIS — E785 Hyperlipidemia, unspecified: Secondary | ICD-10-CM

## 2013-03-19 DIAGNOSIS — I509 Heart failure, unspecified: Secondary | ICD-10-CM

## 2013-03-19 DIAGNOSIS — I2789 Other specified pulmonary heart diseases: Secondary | ICD-10-CM

## 2013-03-19 NOTE — Assessment & Plan Note (Signed)
Currently with no symptoms of angina. No further workup at this time. Continue current medication regimen. 

## 2013-03-19 NOTE — Assessment & Plan Note (Signed)
Weight is up at least 5 pounds. We have encouraged him to take torsemide 40 mg twice a day for any weight greater than 139 pounds. Decrease dose back to torsemide 40 mg daily for weight less than 139 pounds.

## 2013-03-19 NOTE — Patient Instructions (Addendum)
You have extra fluid today  For weight greater than 139, take torsemide 2 pills in the AM and Pm  For weight less than 137, take torsemide 2 pills in the AM   Please call us if you have new issues that need to be addressed before your next appt.  Your physician wants you to follow-up in: 3 months.  You will receive a reminder letter in the mail two months in advance.  If you don't receive a letter, please call our office to schedule the follow-up appointment.

## 2013-03-19 NOTE — Assessment & Plan Note (Signed)
Heart rate well controlled. Chronic atrial fibrillation. He does not want anticoagulation at this time. He is aware of risk and benefit. He is high fall risk

## 2013-03-19 NOTE — Progress Notes (Signed)
Patient ID: Dylan Perkins, male    DOB: 1918-05-22, 79 y.o.   MRN: [id number]  HPI Comments: 79 yo WM with history of DM, HTN, hyperlipidemia, CAD s/p CABG and redo, s/p porcine AVR with moderate residual stenosis, chronic diastolic CHF ,  chronic atrial fibrillation  on coumadin therapy,   intolerance of statins, who presents for routine followup. H/o clot to his right eye " that has caused significant vision loss on the right.  Previous  Echo showing  pulmonary HTN.   He reports that he has been taking torsemide 40 mg in the morning. His weight was previously doing well, 139 range. Recently it is 145. He has worsening lower extremity edema. Did not increase the torsemide dosing for any weight gain.  He also has poor sleep, sleeping in a recliner, abdominal distention, scrotal swelling. Overall he does not feel well Warfarin was previously stopped for arm trauma on the left after a fall. He does not want to restart warfarin. He is aware of the risk and benefits  Aware that he could have stroke with atrial fibrillation. There is a bandage on his left arm  He had a  echocardiogram for worsening edema on 07/28/2011 Echocardiogram showed severe pulmonary hypertension, moderate aortic valve stenosis of his bioprosthetic valve, mildly dilated left atrium  EKG shows atrial fibrillation with ventricular rate 78  beats per minute, intraventricular conduction delay, old anteroseptal infarct, left axis deviation/LAFB     Outpatient Encounter Prescriptions as of 03/19/2013  Medication Sig Dispense Refill  . alum & mag hydroxide-simeth (MYLANTA) 200-200-20 MG/5ML suspension Take by mouth as needed.        Marland Kitchen aspirin EC 81 MG tablet Take 81 mg by mouth every other day.       Marland Kitchen atenolol (TENORMIN) 25 MG tablet Take 1.5 tablets (37.5 mg total) by mouth daily.  45 tablet  6  . calcium carbonate (TUMS) 500 MG chewable tablet Chew 1 tablet by mouth daily as needed.       . docusate sodium (COLACE) 100 MG  capsule Take 300 mg by mouth daily.       Marland Kitchen glipiZIDE (GLUCOTROL) 10 MG tablet TAKE 1 TABLET TWICE DAILY  60 tablet  9  . Glucose Blood (BAYER BREEZE 2 TEST) DISK TEST UP TO 4 TIMES PER DAY, dx: 250.00  100 each  6  . isosorbide mononitrate (IMDUR) 30 MG 24 hr tablet Take 1 tablet (30 mg total) by mouth daily.  90 tablet  3  . levothyroxine (SYNTHROID, LEVOTHROID) 25 MCG tablet TAKE 1 TABLET EVERY DAY  90 tablet  3  . losartan (COZAAR) 25 MG tablet TAKE 1 TABLET (25 MG TOTAL) BY MOUTH DAILY.  90 tablet  4  . Multiple Vitamin (MULTIVITAMIN) tablet Take 1 tablet by mouth daily after breakfast.        . ranitidine (ZANTAC) 150 MG capsule Take 150 mg by mouth daily as needed.       . torsemide (DEMADEX) 20 MG tablet Take 2 tablets (40 mg total) by mouth 2 (two) times daily.  120 tablet  3    Review of Systems  Constitutional: Negative.   HENT: Negative.   Eyes: Negative.   Respiratory: Negative.   Cardiovascular: Positive for leg swelling.  Gastrointestinal: Negative.   Musculoskeletal: Positive for gait problem.  Skin: Negative.   Neurological: Negative.   Psychiatric/Behavioral: Positive for sleep disturbance.  All other systems reviewed and are negative.    BP 114/61  Pulse 78  Ht 5\' 8"  (1.727 m)  Wt 148 lb 12 oz (67.473 kg)  BMI 22.62 kg/m2  Physical Exam  Nursing note and vitals reviewed. Constitutional: He is oriented to person, place, and time. He appears well-developed and well-nourished.  HENT:  Head: Normocephalic.  Nose: Nose normal.  Mouth/Throat: Oropharynx is clear and moist.  Eyes: Conjunctivae are normal. Pupils are equal, round, and reactive to light.  Neck: Normal range of motion. Neck supple. No JVD present.  Cardiovascular: Normal rate, S1 normal, S2 normal and intact distal pulses.  An irregularly irregular rhythm present. Exam reveals no gallop and no friction rub.   Murmur heard.  Crescendo systolic murmur is present with a grade of 2/6  2+ pitting  edema.   Pulmonary/Chest: Effort normal. No respiratory distress. He has decreased breath sounds in the right lower field. He has no wheezes. He has no rales. He exhibits no tenderness.  Abdominal: Soft. Bowel sounds are normal. He exhibits no distension. There is no tenderness.  Musculoskeletal: Normal range of motion. He exhibits no edema and no tenderness.  Lymphadenopathy:    He has no cervical adenopathy.  Neurological: He is alert and oriented to person, place, and time. Coordination normal.  Skin: Skin is warm and dry. No rash noted. No erythema.  Psychiatric: He has a normal mood and affect. His behavior is normal. Judgment and thought content normal.      Assessment and Plan

## 2013-03-19 NOTE — Assessment & Plan Note (Signed)
Cholesterol is at goal on the current lipid regimen. No changes to the medications were made.  

## 2013-03-19 NOTE — Assessment & Plan Note (Signed)
We'll increase diuretic regimen as above

## 2013-04-01 ENCOUNTER — Telehealth: Payer: Self-pay

## 2013-04-01 NOTE — Telephone Encounter (Signed)
Spoke w/ pt.  On visit 9/2: "For weight greater than 139, take torsemide 2 pills in the AM and Pm  For weight less than 137, take torsemide 2 pills in the AM"  Pt states that 2 torsemide is "too much" and makes him feel "lousy".  He is currently at 140 lbs, is only taking 1 torsemide. He would like to "try one pill for awhile", as he feels much better and he got a "good night's sleep last night and wasn't up peeing all night". Would like to know if this is agreeable with Dr. Mariah Milling or if he needs a med change.

## 2013-04-02 NOTE — Telephone Encounter (Signed)
I spoke with the patient. He is aware of Dr. Windell Hummingbird recommendations and verbalizes understanding.

## 2013-04-02 NOTE — Telephone Encounter (Signed)
It really depends on his weight Would like to keep his weight low as he did have significant edema If he feels okay with weight of 140 and he is stable at this weight, okay to take torsemide once a day Would take extra torsemide for any little weight gain

## 2013-04-15 ENCOUNTER — Ambulatory Visit (INDEPENDENT_AMBULATORY_CARE_PROVIDER_SITE_OTHER): Payer: Medicare Other | Admitting: Internal Medicine

## 2013-04-15 ENCOUNTER — Encounter: Payer: Self-pay | Admitting: Internal Medicine

## 2013-04-15 VITALS — BP 120/60 | Temp 98.3°F | Wt 146.0 lb

## 2013-04-15 DIAGNOSIS — L03317 Cellulitis of buttock: Secondary | ICD-10-CM

## 2013-04-15 DIAGNOSIS — L0231 Cutaneous abscess of buttock: Secondary | ICD-10-CM

## 2013-04-15 MED ORDER — AMOXICILLIN-POT CLAVULANATE 875-125 MG PO TABS: 1.0000 | ORAL_TABLET | Freq: Two times a day (BID) | ORAL | Status: DC

## 2013-04-15 NOTE — Patient Instructions (Signed)
Please continue the current creams but keep the ulcer moist by covering it to keep the cream on it all day.

## 2013-04-15 NOTE — Progress Notes (Signed)
Subjective:    Patient ID: Dylan Perkins, male    DOB: 05/02/1918, 79 y.o.   MRN: [id number]  HPI Has sore on "butt" Close to rectum but not right at it On and off for almost a year Recent flare Trouble sitting No significant blood in stool or on paper  Uses bacitracin or mupirocin daily  Current Outpatient Prescriptions on File Prior to Visit  Medication Sig Dispense Refill  . alum & mag hydroxide-simeth (MYLANTA) 200-200-20 MG/5ML suspension Take by mouth as needed.        Marland Kitchen aspirin EC 81 MG tablet Take 81 mg by mouth every other day.       Marland Kitchen atenolol (TENORMIN) 25 MG tablet Take 1.5 tablets (37.5 mg total) by mouth daily.  45 tablet  6  . calcium carbonate (TUMS) 500 MG chewable tablet Chew 1 tablet by mouth daily as needed.       . docusate sodium (COLACE) 100 MG capsule Take 300 mg by mouth daily.       Marland Kitchen glipiZIDE (GLUCOTROL) 10 MG tablet TAKE 1 TABLET TWICE DAILY  60 tablet  9  . Glucose Blood (BAYER BREEZE 2 TEST) DISK TEST UP TO 4 TIMES PER DAY, dx: 250.00  100 each  6  . isosorbide mononitrate (IMDUR) 30 MG 24 hr tablet Take 1 tablet (30 mg total) by mouth daily.  90 tablet  3  . levothyroxine (SYNTHROID, LEVOTHROID) 25 MCG tablet TAKE 1 TABLET EVERY DAY  90 tablet  3  . losartan (COZAAR) 25 MG tablet TAKE 1 TABLET (25 MG TOTAL) BY MOUTH DAILY.  90 tablet  4  . Multiple Vitamin (MULTIVITAMIN) tablet Take 1 tablet by mouth daily after breakfast.        . ranitidine (ZANTAC) 150 MG capsule Take 150 mg by mouth daily as needed.        No current facility-administered medications on file prior to visit.    Allergies  Allergen Reactions  . Lisinopril     REACTION: cough    Past Medical History  Diagnosis Date  . Allergic rhinitis   . Anemia     NOS  . CAD (coronary artery disease)   . DM II (diabetes mellitus, type II), controlled   . HLD (hyperlipidemia)   . HTN (hypertension)   . OA (osteoarthritis)   . BPH (benign prostatic hypertrophy)   . Diverticulosis of  colon     with repeated bleeds  . Atrial fibrillation or flutter 11/09  . Depression   . CHF (congestive heart failure)   . Thyroid disease   . GI bleed 2013    Non determined  . Pulmonary hypertension 2013    On Echo 07/2011  . Aortic stenosis   . Heart murmur   . Spinal stenosis     Past Surgical History  Procedure Laterality Date  . Tonsillectomy and adenoidectomy  1933  . Coronary artery bypass graft  1995  . Coronary artery bypass graft  1999    Redo  . Hemorrhoid surgery  1974  . Transurethral resection of prostate  1993  . Nasal stenosis repair  1996  . Aortic valve replacement  1999    pig valve  . Total shoulder replacement  1992    right  . Retinal vitrectomy      Left  . Cataract extraction  1999  . Carcinoma      head    Family History  Problem Relation Age of Onset  . Cancer Mother     (  male)  . Parkinsonism Maternal Grandfather   . Mental retardation Son   . Colon cancer Neg Hx     History   Social History  . Marital Status: Married    Spouse Name: N/A    Number of Children: 5  . Years of Education: N/A   Occupational History  . 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 Smoking status: Former Smoker    Quit date: 07/18/1950  . Smokeless tobacco: Never Used  . Alcohol Use: Yes     Comment: one daily   . Drug Use: No  . Sexual Activity: Not on file   Other Topics Concern  . Not on file   Social History Narrative   Has living will   Wife has health care POA---then son, Weston Brass   Requests DNR and order done   Would not want a feeding tube   Daily caffeine    Review of Systems Bowels regular  Appetite is down some    Objective:   Physical Exam  Constitutional: He appears well-developed and well-nourished. No distress.  Skin:  ~56mm shallow ulcer on upper left buttock cheek---a little below the pilonidal area ~5cm surrounding erythema with mild warmth and tenderness No discharge           Assessment & Plan:

## 2013-04-15 NOTE — Assessment & Plan Note (Signed)
Complicating chronic but shallow ulcer. Will treat with augmentin  Instructed wife that the antibiotic creams are fine---but the ulcer needs to stay moist. Needs covering

## 2013-04-22 ENCOUNTER — Ambulatory Visit (INDEPENDENT_AMBULATORY_CARE_PROVIDER_SITE_OTHER): Payer: Medicare Other | Admitting: Cardiovascular Disease

## 2013-04-22 ENCOUNTER — Telehealth: Payer: Self-pay | Admitting: *Deleted

## 2013-04-22 ENCOUNTER — Encounter: Payer: Self-pay | Admitting: Cardiovascular Disease

## 2013-04-22 VITALS — BP 122/74 | HR 89 | Ht 68.0 in | Wt 153.8 lb

## 2013-04-22 DIAGNOSIS — I272 Pulmonary hypertension, unspecified: Secondary | ICD-10-CM

## 2013-04-22 DIAGNOSIS — I4891 Unspecified atrial fibrillation: Secondary | ICD-10-CM

## 2013-04-22 DIAGNOSIS — I2789 Other specified pulmonary heart diseases: Secondary | ICD-10-CM

## 2013-04-22 DIAGNOSIS — I509 Heart failure, unspecified: Secondary | ICD-10-CM

## 2013-04-22 DIAGNOSIS — I5032 Chronic diastolic (congestive) heart failure: Secondary | ICD-10-CM

## 2013-04-22 DIAGNOSIS — R079 Chest pain, unspecified: Secondary | ICD-10-CM

## 2013-04-22 DIAGNOSIS — R609 Edema, unspecified: Secondary | ICD-10-CM

## 2013-04-22 DIAGNOSIS — I25810 Atherosclerosis of coronary artery bypass graft(s) without angina pectoris: Secondary | ICD-10-CM

## 2013-04-22 NOTE — Assessment & Plan Note (Signed)
Known severe pulmonary hypertension, worse on today's visit with elevated weight, anorexia, nausea, abdominal swelling, edema to the thighs.

## 2013-04-22 NOTE — Progress Notes (Signed)
Patient ID: Epic Tribbett, male    DOB: [birth year], 79 y.o.   MRN: [id number]  HPI Comments: 79 yo WM with history of DM, HTN, hyperlipidemia, CAD s/p CABG and redo, s/p porcine AVR with moderate residual stenosis, chronic diastolic CHF ,  chronic atrial fibrillation  on coumadin therapy,   intolerance of statins, who presents for routine followup. H/o clot to his right eye " that has caused significant vision loss on the right.  Previous  Echo showing  pulmonary HTN.   He reports that he has been taking torsemide 40 mg in the morning. He does not like taking torsemide twice a day despite recent weight gain as he has incontinence. He is frustrated as shortness of breath has been getting worse, now with abdominal swelling, worsening leg edema. He has nausea/anorexia. He sleeps in a recliner as he is able to breathe better.  Ideal weight in the past has been between 135 and 139. Weight today is 153 pounds. He is having difficulty walking secondary to his leg edema. He reports sleep has been even worse, worsening scrotal edema. He is worried about a sore on his bottom which is slowly healing. Wife is helping to take care of at.  In the past he has refused restarting warfarin.  He is aware of the risk and benefitsAware that he could have stroke with atrial fibrillation.  He had a  echocardiogram for worsening edema on 07/28/2011 Echocardiogram showed severe pulmonary hypertension, moderate aortic valve stenosis of his bioprosthetic valve, mildly dilated left atrium  EKG shows atrial fibrillation with ventricular rate 89  beats per minute, intraventricular conduction delay, old anteroseptal infarct, left axis deviation/LAFB     Outpatient Encounter Prescriptions as of 04/22/2013  Medication Sig Dispense Refill  . alum & mag hydroxide-simeth (MYLANTA) 200-200-20 MG/5ML suspension Take by mouth as needed.        Marland Kitchen amoxicillin-clavulanate (AUGMENTIN) 875-125 MG per tablet Take 1 tablet by mouth 2 (two)  times daily.  20 tablet  0  . aspirin EC 81 MG tablet Take 81 mg by mouth every other day.       Marland Kitchen atenolol (TENORMIN) 25 MG tablet Take 1.5 tablets (37.5 mg total) by mouth daily.  45 tablet  6  . calcium carbonate (TUMS) 500 MG chewable tablet Chew 1 tablet by mouth daily as needed.       . docusate sodium (COLACE) 100 MG capsule Take 300 mg by mouth daily.       Marland Kitchen glipiZIDE (GLUCOTROL) 10 MG tablet TAKE 1 TABLET TWICE DAILY  60 tablet  9  . Glucose Blood (BAYER BREEZE 2 TEST) DISK TEST UP TO 4 TIMES PER DAY, dx: 250.00  100 each  6  . isosorbide mononitrate (IMDUR) 30 MG 24 hr tablet Take 1 tablet (30 mg total) by mouth daily.  90 tablet  3  . levothyroxine (SYNTHROID, LEVOTHROID) 25 MCG tablet TAKE 1 TABLET EVERY DAY  90 tablet  3  . losartan (COZAAR) 25 MG tablet TAKE 1 TABLET (25 MG TOTAL) BY MOUTH DAILY.  90 tablet  4  . Multiple Vitamin (MULTIVITAMIN) tablet Take 1 tablet by mouth daily after breakfast.        . ranitidine (ZANTAC) 150 MG capsule Take 150 mg by mouth daily as needed.       . torsemide (DEMADEX) 20 MG tablet Take 20 mg by mouth daily.        Review of Systems  Constitutional: Negative.   HENT: Negative.  Eyes: Negative.   Respiratory: Positive for shortness of breath.   Cardiovascular: Positive for leg swelling.  Gastrointestinal: Positive for abdominal distention.  Musculoskeletal: Positive for gait problem.  Skin: Negative.   Neurological: Negative.   Psychiatric/Behavioral: Positive for sleep disturbance.  All other systems reviewed and are negative.    BP 122/74  Pulse 89  Ht 5\' 8"  (1.727 m)  Wt 153 lb 12 oz (69.741 kg)  BMI 23.38 kg/m2  Physical Exam  Nursing note and vitals reviewed. Constitutional: He is oriented to person, place, and time. He appears well-developed and well-nourished.  HENT:  Head: Normocephalic.  Nose: Nose normal.  Mouth/Throat: Oropharynx is clear and moist.  Eyes: Conjunctivae are normal. Pupils are equal, round, and  reactive to light.  Neck: Normal range of motion. Neck supple. No JVD present.  Cardiovascular: Normal rate, S1 normal, S2 normal and intact distal pulses.  An irregularly irregular rhythm present. Exam reveals no gallop and no friction rub.   Murmur heard.  Crescendo systolic murmur is present with a grade of 2/6  2+ pitting edema to the upper thighs bilaterally  Pulmonary/Chest: Effort normal. No respiratory distress. He has decreased breath sounds in the right lower field and the left lower field. He has no wheezes. He has no rales. He exhibits no tenderness.  Abdominal: Soft. Bowel sounds are normal. He exhibits no distension. There is no tenderness.  Musculoskeletal: Normal range of motion. He exhibits no edema and no tenderness.  Lymphadenopathy:    He has no cervical adenopathy.  Neurological: He is alert and oriented to person, place, and time. Coordination normal.  Skin: Skin is warm and dry. No rash noted. No erythema.  Psychiatric: He has a normal mood and affect. His behavior is normal. Judgment and thought content normal.      Assessment and Plan

## 2013-04-22 NOTE — Assessment & Plan Note (Signed)
Edema is the worst that seemed in some time. Weight is significantly elevated, 15 pounds above his ideal weight. Suggested he go to the hospital as he is not willing to take torsemide more than once a day.

## 2013-04-22 NOTE — Telephone Encounter (Signed)
Spoke w/ pt.  He is "having an awful time".  He c/o cold hands, light headedness, trembling. States that he is not tolerating the torsemide, as it makes him feel bad.  His weight is up to 148, but he is only taking 1 pill a day. States he has been "battling diarrhea vs constipation". States his legs are swelling and he doesn't want to take additional torsemide b/c it also causes urinary leakage.  Pt sched to come in this afternoon to see Dr. Mariah Milling at 3:00 to discuss his medication.

## 2013-04-22 NOTE — Assessment & Plan Note (Signed)
Currently with no symptoms of angina.  Continue current medication regimen. He may need repeat echocardiogram to evaluate cardiac function while in the hospital.

## 2013-04-22 NOTE — Patient Instructions (Addendum)
Given your significant leg swelling, belly swelling, weight gain,  I think you should go to the ER in the AM for IV diuretics  If you get more short of breath tonight, please take an extra torsemide tonight  Please call us if you have new issues that need to be addressed before your next appt.  Your physician wants you to follow-up in: 1 month.

## 2013-04-22 NOTE — Assessment & Plan Note (Signed)
Today's having acute on chronic diastolic heart failure. He is unable to tolerate more than torsemide 40 mg once a day secondary to incontinence and various other issues. Overall not feeling well and having worsening symptoms and weight gain. Suggested he go to the emergency room. He would need IV diuretics at least twice a day with close monitoring of his electrolytes.

## 2013-04-22 NOTE — Assessment & Plan Note (Signed)
Heart rate well to be well controlled. This is contributing to underlying diastolic heart failure. He does not want warfarin.

## 2013-04-22 NOTE — Telephone Encounter (Signed)
Patient called and his legs are swelling and he feels not well please advise.

## 2013-04-23 ENCOUNTER — Inpatient Hospital Stay: Payer: Self-pay | Admitting: Internal Medicine

## 2013-04-23 DIAGNOSIS — I359 Nonrheumatic aortic valve disorder, unspecified: Secondary | ICD-10-CM

## 2013-04-23 DIAGNOSIS — I5033 Acute on chronic diastolic (congestive) heart failure: Secondary | ICD-10-CM

## 2013-04-23 LAB — CBC
HCT: 38.5 % — ABNORMAL LOW (ref 40.0–52.0)
HGB: 13 g/dL (ref 13.0–18.0)
MCH: 33.3 pg (ref 26.0–34.0)
MCHC: 33.7 g/dL (ref 32.0–36.0)
Platelet: 131 10*3/uL — ABNORMAL LOW (ref 150–440)
RBC: 3.89 10*6/uL — ABNORMAL LOW (ref 4.40–5.90)
RDW: 14.8 % — ABNORMAL HIGH (ref 11.5–14.5)
WBC: 5.3 10*3/uL (ref 3.8–10.6)

## 2013-04-23 LAB — BASIC METABOLIC PANEL
Anion Gap: 3 — ABNORMAL LOW (ref 7–16)
BUN: 36 mg/dL — ABNORMAL HIGH (ref 7–18)
Calcium, Total: 8.8 mg/dL (ref 8.5–10.1)
Co2: 30 mmol/L (ref 21–32)
EGFR (African American): 40 — ABNORMAL LOW
EGFR (Non-African Amer.): 35 — ABNORMAL LOW
Glucose: 325 mg/dL — ABNORMAL HIGH (ref 65–99)
Osmolality: 285 (ref 275–301)
Potassium: 4.5 mmol/L (ref 3.5–5.1)
Sodium: 132 mmol/L — ABNORMAL LOW (ref 136–145)

## 2013-04-23 LAB — PRO B NATRIURETIC PEPTIDE: B-Type Natriuretic Peptide: 7725 pg/mL — ABNORMAL HIGH (ref 0–450)

## 2013-04-24 ENCOUNTER — Encounter: Payer: Self-pay | Admitting: Cardiovascular Disease

## 2013-04-24 LAB — BASIC METABOLIC PANEL
Calcium, Total: 9.1 mg/dL (ref 8.5–10.1)
Co2: 35 mmol/L — ABNORMAL HIGH (ref 21–32)
Creatinine: 1.44 mg/dL — ABNORMAL HIGH (ref 0.60–1.30)
EGFR (African American): 48 — ABNORMAL LOW
EGFR (Non-African Amer.): 42 — ABNORMAL LOW
Potassium: 3.8 mmol/L (ref 3.5–5.1)

## 2013-04-25 LAB — BASIC METABOLIC PANEL
BUN: 31 mg/dL — ABNORMAL HIGH (ref 7–18)
Calcium, Total: 8.7 mg/dL (ref 8.5–10.1)
Chloride: 94 mmol/L — ABNORMAL LOW (ref 98–107)
Co2: 35 mmol/L — ABNORMAL HIGH (ref 21–32)
Creatinine: 1.44 mg/dL — ABNORMAL HIGH (ref 0.60–1.30)
EGFR (African American): 48 — ABNORMAL LOW
EGFR (Non-African Amer.): 42 — ABNORMAL LOW
Osmolality: 275 (ref 275–301)
Sodium: 131 mmol/L — ABNORMAL LOW (ref 136–145)

## 2013-04-26 ENCOUNTER — Telehealth: Payer: Self-pay

## 2013-04-26 DIAGNOSIS — I5031 Acute diastolic (congestive) heart failure: Secondary | ICD-10-CM

## 2013-04-26 LAB — BASIC METABOLIC PANEL
Calcium, Total: 9 mg/dL (ref 8.5–10.1)
Chloride: 94 mmol/L — ABNORMAL LOW (ref 98–107)
Creatinine: 1.34 mg/dL — ABNORMAL HIGH (ref 0.60–1.30)
EGFR (African American): 53 — ABNORMAL LOW
EGFR (Non-African Amer.): 45 — ABNORMAL LOW
Osmolality: 270 (ref 275–301)
Potassium: 3.5 mmol/L (ref 3.5–5.1)
Sodium: 132 mmol/L — ABNORMAL LOW (ref 136–145)

## 2013-04-26 NOTE — Telephone Encounter (Signed)
Patient contacted regarding discharge from [facility] on 04/26/13.  Patient understands to follow up with provider Dr. Mariah Milling on 05/03/13 at 3:00 at [facility]. Patient understands discharge instructions? yes Patient understands medications and regiment? yes Patient understands to bring all medications to this visit? yes

## 2013-05-02 ENCOUNTER — Telehealth: Payer: Self-pay | Admitting: *Deleted

## 2013-05-02 NOTE — Telephone Encounter (Signed)
Pt states he doing ok, he has an appt on Monday 05/06/13, pt states the swelling is better, still kinda groggy, per patient

## 2013-05-02 NOTE — Telephone Encounter (Signed)
Okay  Will evaluate then, recheck renal function, etc

## 2013-05-02 NOTE — Telephone Encounter (Signed)
Message copied by Sueanne Margarita on Thu May 02, 2013  8:52 AM ------      Message from: Dylan Perkins      Created: Wed May 01, 2013  7:44 AM       Please call him to make sure he is doing okay since coming home from the hospital      Make phone note ------

## 2013-05-03 ENCOUNTER — Encounter: Payer: Self-pay | Admitting: Cardiovascular Disease

## 2013-05-03 ENCOUNTER — Ambulatory Visit (INDEPENDENT_AMBULATORY_CARE_PROVIDER_SITE_OTHER): Payer: Medicare Other | Admitting: Cardiovascular Disease

## 2013-05-03 VITALS — BP 120/62 | HR 97 | Ht 68.0 in | Wt 139.5 lb

## 2013-05-03 DIAGNOSIS — I4891 Unspecified atrial fibrillation: Secondary | ICD-10-CM

## 2013-05-03 DIAGNOSIS — M545 Low back pain: Secondary | ICD-10-CM

## 2013-05-03 DIAGNOSIS — I509 Heart failure, unspecified: Secondary | ICD-10-CM

## 2013-05-03 DIAGNOSIS — I272 Pulmonary hypertension, unspecified: Secondary | ICD-10-CM

## 2013-05-03 DIAGNOSIS — R0602 Shortness of breath: Secondary | ICD-10-CM

## 2013-05-03 DIAGNOSIS — I2510 Atherosclerotic heart disease of native coronary artery without angina pectoris: Secondary | ICD-10-CM

## 2013-05-03 MED ORDER — TORSEMIDE 20 MG PO TABS: 40.0000 mg | ORAL_TABLET | Freq: Two times a day (BID) | ORAL | Status: AC

## 2013-05-03 NOTE — Progress Notes (Signed)
Patient ID: Dylan Perkins, male    DOB: [birth year], 79 y.o.   MRN: [id number]  HPI Comments: 79 yo WM with history of DM, HTN, hyperlipidemia, CAD s/p CABG and redo, s/p porcine AVR with moderate residual stenosis, chronic diastolic CHF ,  chronic atrial fibrillation  on coumadin therapy,   intolerance of statins, who presents for routine followup. H/o clot to his right eye " that has caused significant vision loss on the right.  Previous  Echo showing  pulmonary HTN.   On his last clinic visit several weeks ago, he was unable to tolerate more than torsemide 40 mg in the morning. He was having problems with incontinence, was very frustrated, short of breath, abdominal swelling and severe edema including nausea and anorexia. He was sleeping in the recliner to breathe better. Weight was 153 pounds.  He was admitted to the hospital the next day with significant diuresis. He was at [facility]. Admission date on October 7 and discharged date 04/26/2013 Echocardiogram in the hospital showed mild to moderate sized left pleural effusion, ejection fraction 55-60%, severe pulmonary hypertension with moderate tricuspid regurg, moderate mitral valve regurg, moderately dilated left atrium, mild aortic valve stenosis BNP was 7700 Creatinine on arrival to the hospital was 1.67, BUN 36, potassium 4.5. At the time of discharge his creatinine was 1.34, BUN 29 with potassium 3.5 He presents for followup today and weight in the office is 139 pounds. He reports that weight at home is 134.6 pounds. He has been stable over the past week. He reports taking only torsemide 20 mg daily (he reports taking torsemide 10 mg x2). His legs still have edema but no edema in his thighs, abdomen feels better, breathing feels better. His biggest complaint is his back. He has soreness to touch, "spinal stenosis". He has been to pain clinic before. He does not like narcotics.  In the past he has refused restarting warfarin.  He is aware of the  risk and benefitsAware that he could have stroke with atrial fibrillation.  He had a  echocardiogram for worsening edema on 07/28/2011 Echocardiogram showed severe pulmonary hypertension, moderate aortic valve stenosis of his bioprosthetic valve, mildly dilated left atrium  EKG shows atrial fibrillation with ventricular rate 97  beats per minute, intraventricular conduction delay, old anteroseptal infarct, left axis deviation/LAFB     Outpatient Encounter Prescriptions as of 05/03/2013  Medication Sig Dispense Refill  . alum & mag hydroxide-simeth (MYLANTA) 200-200-20 MG/5ML suspension Take by mouth as needed.        Marland Kitchen amoxicillin-clavulanate (AUGMENTIN) 875-125 MG per tablet Take 1 tablet by mouth 2 (two) times daily.  20 tablet  0  . aspirin EC 81 MG tablet Take 81 mg by mouth every other day.       Marland Kitchen atenolol (TENORMIN) 25 MG tablet Take 1.5 tablets (37.5 mg total) by mouth daily.  45 tablet  6  . calcium carbonate (TUMS) 500 MG chewable tablet Chew 1 tablet by mouth daily as needed.       . docusate sodium (COLACE) 100 MG capsule Take 300 mg by mouth daily.       Marland Kitchen glipiZIDE (GLUCOTROL) 10 MG tablet TAKE 1 TABLET TWICE DAILY  60 tablet  9  . Glucose Blood (BAYER BREEZE 2 TEST) DISK TEST UP TO 4 TIMES PER DAY, dx: 250.00  100 each  6  . isosorbide mononitrate (IMDUR) 30 MG 24 hr tablet Take 1 tablet (30 mg total) by mouth daily.  90 tablet  3  . levothyroxine (SYNTHROID, LEVOTHROID) 25 MCG tablet TAKE 1 TABLET EVERY DAY  90 tablet  3  . losartan (COZAAR) 25 MG tablet TAKE 1 TABLET (25 MG TOTAL) BY MOUTH DAILY.  90 tablet  4  . Multiple Vitamin (MULTIVITAMIN) tablet Take 1 tablet by mouth daily after breakfast.        . ranitidine (ZANTAC) 150 MG capsule Take 150 mg by mouth daily as needed.       .  torsemide (DEMADEX) 20 MG tablet Take 20 mg by mouth daily.       . mupirocin ointment (BACTROBAN) 2 %        No facility-administered encounter medications on file as of 05/03/2013.     Review of Systems  Constitutional: Negative.   HENT: Negative.   Eyes: Negative.   Cardiovascular: Positive for leg swelling.  Endocrine: Negative.   Musculoskeletal: Positive for gait problem.  Skin: Negative.   Allergic/Immunologic: Negative.   Neurological: Negative.   Hematological: Negative.   All other systems reviewed and are negative.    BP 120/62  Pulse 97  Ht 5\' 8"  (1.727 m)  Wt 139 lb 8 oz (63.277 kg)  BMI 21.22 kg/m2  Physical Exam  Nursing note and vitals reviewed. Constitutional: He is oriented to person, place, and time. He appears well-developed and well-nourished.  HENT:  Head: Normocephalic.  Nose: Nose normal.  Mouth/Throat: Oropharynx is clear and moist.  Eyes: Conjunctivae are normal. Pupils are equal, round, and reactive to light.  Neck: Normal range of motion. Neck supple. No JVD present.  Cardiovascular: Normal rate, S1 normal, S2 normal and intact distal pulses.  An irregularly irregular rhythm present. Exam reveals no gallop and no friction rub.   Murmur heard.  Crescendo systolic murmur is present with a grade of 2/6  1+ pitting edema to the mid shins, "woody edema"  Pulmonary/Chest: Effort normal. No respiratory distress. He has decreased breath sounds in the right lower field and the left lower field. He has no wheezes. He has no rales. He exhibits no tenderness.  Abdominal: Soft. Bowel sounds are normal. He exhibits no distension. There is no tenderness.  Musculoskeletal: Normal range of motion. He exhibits no edema and no tenderness.  Lymphadenopathy:    He has no cervical adenopathy.  Neurological: He is alert and oriented to person, place, and time. Coordination normal.  Skin: Skin is warm and dry. No rash noted. No erythema.  Psychiatric: He has a normal mood and affect. His behavior is normal. Judgment and thought content normal.      Assessment and Plan

## 2013-05-03 NOTE — Assessment & Plan Note (Addendum)
Severe pulmonary hypertension seen on recent echocardiogram in the hospital. Compared to his prior visit, he has a 14-15 pound weight drop after recent hospital admission. We have encouraged him to take torsemide as detailed: He reports having torsemide 10 mg pills (previously he was on 20 mg pills). Uncertain if he is mistaking the dosing or if new medication was provided by hospitalist.  For weight less than 133,Take one torsemide For weight 134 to 137, take two torsemide For weight 137 to 141, take two torsmide twice a day For weight greater than 141, call the office

## 2013-05-03 NOTE — Assessment & Plan Note (Signed)
Rate reasonably well controlled. He's not interested in warfarin

## 2013-05-03 NOTE — Assessment & Plan Note (Signed)
Continues to have a pleural effusion on exam. This will likely take some time to reabsorb.

## 2013-05-03 NOTE — Assessment & Plan Note (Signed)
Currently with no symptoms of angina. No further workup at this time. Continue current medication regimen. 

## 2013-05-03 NOTE — Patient Instructions (Signed)
For weight less than 133,Take one torsemide For weight 134 to 137, take two torsemide For weight 137 to 141, take two torsmide twice a day  For weight greater than 141, call the office   Please call us if you have new issues that need to be addressed before your next appt.  Your physician wants you to follow-up in: 1 month.

## 2013-05-03 NOTE — Assessment & Plan Note (Signed)
This seems to be his biggest complaint today. Currently he uses warm compresses for pain relief

## 2013-05-06 ENCOUNTER — Encounter: Payer: Self-pay | Admitting: Internal Medicine

## 2013-05-06 ENCOUNTER — Ambulatory Visit (INDEPENDENT_AMBULATORY_CARE_PROVIDER_SITE_OTHER): Payer: Medicare Other | Admitting: Internal Medicine

## 2013-05-06 VITALS — BP 120/60 | HR 91 | Temp 98.3°F | Wt 138.0 lb

## 2013-05-06 DIAGNOSIS — I4891 Unspecified atrial fibrillation: Secondary | ICD-10-CM

## 2013-05-06 DIAGNOSIS — L0231 Cutaneous abscess of buttock: Secondary | ICD-10-CM

## 2013-05-06 DIAGNOSIS — I509 Heart failure, unspecified: Secondary | ICD-10-CM

## 2013-05-06 DIAGNOSIS — E1149 Type 2 diabetes mellitus with other diabetic neurological complication: Secondary | ICD-10-CM

## 2013-05-06 DIAGNOSIS — L03317 Cellulitis of buttock: Secondary | ICD-10-CM

## 2013-05-06 DIAGNOSIS — E1142 Type 2 diabetes mellitus with diabetic polyneuropathy: Secondary | ICD-10-CM

## 2013-05-06 DIAGNOSIS — I5032 Chronic diastolic (congestive) heart failure: Secondary | ICD-10-CM

## 2013-05-06 DIAGNOSIS — Z23 Encounter for immunization: Secondary | ICD-10-CM

## 2013-05-06 LAB — BASIC METABOLIC PANEL
CO2: 35 mEq/L — ABNORMAL HIGH (ref 19–32)
Chloride: 93 mEq/L — ABNORMAL LOW (ref 96–112)
Glucose, Bld: 173 mg/dL — ABNORMAL HIGH (ref 70–99)
Potassium: 4.2 mEq/L (ref 3.5–5.1)
Sodium: 135 mEq/L (ref 135–145)

## 2013-05-06 LAB — HEMOGLOBIN A1C: Hgb A1c MFr Bld: 8.8 % — ABNORMAL HIGH (ref 4.6–6.5)

## 2013-05-06 NOTE — Progress Notes (Signed)
Subjective:    Patient ID: Dylan Perkins, male    DOB: 1918/02/18, 79 y.o.   MRN: [id number]  HPI Here with wife Feels better Still gets easy DOE  Reviewed the weight chart from Dr Mariah Milling Wife is making sure he gets them now (he was reluctant) Weighing daily--running around 134-135# Some chest pain ("kinda") in AM--then it eases up Some palpitations--skips and races at times (worst when he goes to bed)  Checks sugars qAM Usually in 150's-160's No hypoglycemic reactions Feet are stiff but no nerve pain  Ongoing sleep problems Vivid dreams but no clear REM sleep disorder Thinks there is a big party, etc Naps during the day  Current Outpatient Prescriptions on File Prior to Visit  Medication Sig Dispense Refill  . aspirin EC 81 MG tablet Take 81 mg by mouth every other day.       Marland Kitchen atenolol (TENORMIN) 25 MG tablet Take 1.5 tablets (37.5 mg total) by mouth daily.  45 tablet  6  . calcium carbonate (TUMS) 500 MG chewable tablet Chew 1 tablet by mouth daily as needed.       . docusate sodium (COLACE) 100 MG capsule Take 300 mg by mouth daily.       Marland Kitchen glipiZIDE (GLUCOTROL) 10 MG tablet TAKE 1 TABLET TWICE DAILY  60 tablet  9  . Glucose Blood (BAYER BREEZE 2 TEST) DISK TEST UP TO 4 TIMES PER DAY, dx: 250.00  100 each  6  . isosorbide mononitrate (IMDUR) 30 MG 24 hr tablet Take 1 tablet (30 mg total) by mouth daily.  90 tablet  3  . levothyroxine (SYNTHROID, LEVOTHROID) 25 MCG tablet TAKE 1 TABLET EVERY DAY  90 tablet  3  . losartan (COZAAR) 25 MG tablet TAKE 1 TABLET (25 MG TOTAL) BY MOUTH DAILY.  90 tablet  4  . Multiple Vitamin (MULTIVITAMIN) tablet Take 1 tablet by mouth daily after breakfast.        . ranitidine (ZANTAC) 150 MG capsule Take 150 mg by mouth daily as needed.       . torsemide (DEMADEX) 20 MG tablet Take 2 tablets (40 mg total) by mouth 2 (two) times daily.  120 tablet  6   No current facility-administered medications on file prior to visit.    Allergies  Allergen  Reactions  . Lisinopril     REACTION: cough    Past Medical History  Diagnosis Date  . Allergic rhinitis   . Anemia     NOS  . CAD (coronary artery disease)   . DM II (diabetes mellitus, type II), controlled   . HLD (hyperlipidemia)   . HTN (hypertension)   . OA (osteoarthritis)   . BPH (benign prostatic hypertrophy)   . Diverticulosis of colon     with repeated bleeds  . Atrial fibrillation or flutter 11/09  . Depression   . CHF (congestive heart failure)   . Thyroid disease   . GI bleed 2013    Non determined  . Pulmonary hypertension 2013    On Echo 07/2011  . Aortic stenosis   . Heart murmur   . Spinal stenosis     Past Surgical History  Procedure Laterality Date  . Tonsillectomy and adenoidectomy  1933  . Coronary artery bypass graft  1995  . Coronary artery bypass graft  1999    Redo  . Hemorrhoid surgery  1974  . Transurethral resection of prostate  1993  . Nasal stenosis repair  1996  . Aortic valve  replacement  1999    pig valve  . Total shoulder replacement  1992    right  . Retinal vitrectomy      Left  . Cataract extraction  1999  . Carcinoma      head    Family History  Problem Relation Age of Onset  . Cancer Mother     (male)  . Parkinsonism Maternal Grandfather   . Mental retardation Son   . Colon cancer Neg Hx     History   Social History  . Marital Status: Married    Spouse Name: N/A    Number of Children: 5  . Years of Education: N/A   Occupational History  . 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 Smoking status: Former Smoker    Quit date: 07/18/1950  . Smokeless tobacco: Never Used  . Alcohol Use: Yes     Comment: one daily   . Drug Use: No  . Sexual Activity: Not on file   Other Topics Concern  . Not on file   Social History Narrative   Has living will   Wife has health care POA---then son, Weston Brass   Requests DNR and order done   Would not want a feeding tube   Daily caffeine     Review of Systems Ongoing back pain Vision is deteriorating---trouble even reading the paper (does use a Kindle)    Objective:   Physical Exam  Constitutional: He appears well-developed. No distress.  Neck: Normal range of motion. Neck supple.  Cardiovascular: Normal rate.  Exam reveals no gallop.   Murmur heard. Soft systolic murmur Irregular   Pulmonary/Chest: Effort normal. No respiratory distress. He has no wheezes. He has no rales.  Dullness to percussion and decreased breath sounds just at right base  Musculoskeletal: He exhibits edema.  1+ reedy edema in calves Venous stasis changes  Lymphadenopathy:    He has no cervical adenopathy.  Psychiatric: He has a normal mood and affect. His behavior is normal.          Assessment & Plan:

## 2013-05-06 NOTE — Assessment & Plan Note (Signed)
Rate is okay He doesn't want anticoagulation

## 2013-05-06 NOTE — Assessment & Plan Note (Signed)
Better Discussed the importance of weight management and continuing his diuretics

## 2013-05-06 NOTE — Assessment & Plan Note (Signed)
Small red area to left of rectum persists but smaller and not tender No open areas Wife will continue local care

## 2013-05-06 NOTE — Addendum Note (Signed)
Addended by: Sueanne Margarita on: 05/06/2013 05:35 PM   Modules accepted: Orders

## 2013-05-06 NOTE — Assessment & Plan Note (Signed)
Seems to have acceptable control Due for A1c

## 2013-05-08 ENCOUNTER — Encounter: Payer: Self-pay | Admitting: *Deleted

## 2013-05-16 ENCOUNTER — Ambulatory Visit: Payer: Medicare Other | Admitting: Cardiovascular Disease

## 2013-05-26 DIAGNOSIS — I2789 Other specified pulmonary heart diseases: Secondary | ICD-10-CM

## 2013-05-26 DIAGNOSIS — I509 Heart failure, unspecified: Secondary | ICD-10-CM

## 2013-05-26 DIAGNOSIS — I4891 Unspecified atrial fibrillation: Secondary | ICD-10-CM

## 2013-05-26 DIAGNOSIS — I2510 Atherosclerotic heart disease of native coronary artery without angina pectoris: Secondary | ICD-10-CM

## 2013-05-26 DIAGNOSIS — M545 Low back pain: Secondary | ICD-10-CM

## 2013-05-30 ENCOUNTER — Ambulatory Visit (INDEPENDENT_AMBULATORY_CARE_PROVIDER_SITE_OTHER): Payer: Medicare Other | Admitting: Internal Medicine

## 2013-05-30 ENCOUNTER — Encounter: Payer: Self-pay | Admitting: Internal Medicine

## 2013-05-30 VITALS — BP 110/80 | Temp 98.0°F | Wt 142.0 lb

## 2013-05-30 DIAGNOSIS — H5316 Psychophysical visual disturbances: Secondary | ICD-10-CM

## 2013-05-30 DIAGNOSIS — R441 Visual hallucinations: Secondary | ICD-10-CM

## 2013-05-30 DIAGNOSIS — I509 Heart failure, unspecified: Secondary | ICD-10-CM

## 2013-05-30 DIAGNOSIS — I5032 Chronic diastolic (congestive) heart failure: Secondary | ICD-10-CM

## 2013-05-30 DIAGNOSIS — G4752 REM sleep behavior disorder: Secondary | ICD-10-CM

## 2013-05-30 NOTE — Assessment & Plan Note (Signed)
Has been declining over time He fears how that will be Discussed that we can get hospice in when this is appropriate

## 2013-05-30 NOTE — Assessment & Plan Note (Signed)
Awakens thinking he is at a party and people around "Hyper" pounding his feet and acting like he is going to jump up  Discussed considering clonazepam--will hold off due to risks of side effects

## 2013-05-30 NOTE — Progress Notes (Signed)
Subjective:    Patient ID: Dylan Perkins, male    DOB: 28-Aug-1917, 79 y.o.   MRN: [id number]  HPI Here with wife  Notes that he has been inconsistent with the diuretic Didn't take one yesterday Concerned that this is causing his hallucinations Still mostly with him awakening but several times has seen men working outside their home (that aren't there) Gets upset more at night when it wakes him up He wakes his wife---wondering why all the people are in their house  She is able to settle him down Gets "hyper" per wife-- jumps around and pounds his feet on the bed  Breathing is off a little Weight is up Still gets chest pain--not really a new thing--but maybe some worse Poor energy levels--hard even moving around the house  Current Outpatient Prescriptions on File Prior to Visit  Medication Sig Dispense Refill  . aspirin EC 81 MG tablet Take 81 mg by mouth every other day.       Marland Kitchen atenolol (TENORMIN) 25 MG tablet Take 1.5 tablets (37.5 mg total) by mouth daily.  45 tablet  6  . calcium carbonate (TUMS) 500 MG chewable tablet Chew 1 tablet by mouth daily as needed.       . docusate sodium (COLACE) 100 MG capsule Take 300 mg by mouth daily.       Marland Kitchen glipiZIDE (GLUCOTROL) 10 MG tablet TAKE 1 TABLET TWICE DAILY  60 tablet  9  . Glucose Blood (BAYER BREEZE 2 TEST) DISK TEST UP TO 4 TIMES PER DAY, dx: 250.00  100 each  6  . isosorbide mononitrate (IMDUR) 30 MG 24 hr tablet Take 1 tablet (30 mg total) by mouth daily.  90 tablet  3  . levothyroxine (SYNTHROID, LEVOTHROID) 25 MCG tablet TAKE 1 TABLET EVERY DAY  90 tablet  3  . losartan (COZAAR) 25 MG tablet TAKE 1 TABLET (25 MG TOTAL) BY MOUTH DAILY.  90 tablet  4  . Multiple Vitamin (MULTIVITAMIN) tablet Take 1 tablet by mouth daily after breakfast.        . ranitidine (ZANTAC) 150 MG capsule Take 150 mg by mouth daily as needed.       . torsemide (DEMADEX) 20 MG tablet Take 2 tablets (40 mg total) by mouth 2 (two) times daily.  120 tablet  6    No current facility-administered medications on file prior to visit.    Allergies  Allergen Reactions  . Lisinopril     REACTION: cough    Past Medical History  Diagnosis Date  . Allergic rhinitis   . Anemia     NOS  . CAD (coronary artery disease)   . DM II (diabetes mellitus, type II), controlled   . HLD (hyperlipidemia)   . HTN (hypertension)   . OA (osteoarthritis)   . BPH (benign prostatic hypertrophy)   . Diverticulosis of colon     with repeated bleeds  . Atrial fibrillation or flutter 11/09  . Depression   . CHF (congestive heart failure)   . Thyroid disease   . GI bleed 2013    Non determined  . Pulmonary hypertension 2013    On Echo 07/2011  . Aortic stenosis   . Heart murmur   . Spinal stenosis     Past Surgical History  Procedure Laterality Date  . Tonsillectomy and adenoidectomy  1933  . Coronary artery bypass graft  1995  . Coronary artery bypass graft  1999    Redo  . Hemorrhoid surgery  1974  . Transurethral resection of prostate  1993  . Nasal stenosis repair  1996  . Aortic valve replacement  1999    pig valve  . Total shoulder replacement  1992    right  . Retinal vitrectomy      Left  . Cataract extraction  1999  . Carcinoma      head    Family History  Problem Relation Age of Onset  . Cancer Mother     (male)  . Parkinsonism Maternal Grandfather   . Mental retardation Son   . Colon cancer Neg Hx     History   Social History  . Marital Status: Married    Spouse Name: N/A    Number of Children: 5  . Years of Education: N/A   Occupational History  . 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 Smoking status: Former Smoker    Quit date: 07/18/1950  . Smokeless tobacco: Never Used  . Alcohol Use: Yes     Comment: one daily   . Drug Use: No  . Sexual Activity: Not on file   Other Topics Concern  . Not on file   Social History Narrative   Has living will   Wife has health care  POA---then son, Weston Brass   Requests DNR and order done   Would not want a feeding tube   Daily caffeine    Review of Systems Appetite is not good Weight up ----fluid     Objective:   Physical Exam  Constitutional: He appears well-developed. No distress.  Neck: No thyromegaly present.  Cardiovascular: Normal rate.  Exam reveals no gallop.   No murmur heard. irregular  Pulmonary/Chest: Effort normal and breath sounds normal. No respiratory distress. He has no wheezes. He has no rales.  Musculoskeletal: He exhibits no edema and no tenderness.  Lymphadenopathy:    He has no cervical adenopathy.  Psychiatric:  Upset about what is going on          Assessment & Plan:

## 2013-05-30 NOTE — Assessment & Plan Note (Signed)
Sees people out the windows May be due to perceptual decline Not overly distressing No Rx for now

## 2013-06-02 ENCOUNTER — Other Ambulatory Visit: Payer: Self-pay | Admitting: Internal Medicine

## 2013-06-03 ENCOUNTER — Ambulatory Visit (INDEPENDENT_AMBULATORY_CARE_PROVIDER_SITE_OTHER): Payer: Medicare Other | Admitting: Cardiovascular Disease

## 2013-06-03 ENCOUNTER — Encounter: Payer: Self-pay | Admitting: Cardiovascular Disease

## 2013-06-03 VITALS — BP 120/60 | Ht 68.0 in | Wt 141.0 lb

## 2013-06-03 DIAGNOSIS — R0602 Shortness of breath: Secondary | ICD-10-CM

## 2013-06-03 DIAGNOSIS — F3289 Other specified depressive episodes: Secondary | ICD-10-CM

## 2013-06-03 DIAGNOSIS — I10 Essential (primary) hypertension: Secondary | ICD-10-CM

## 2013-06-03 DIAGNOSIS — I25810 Atherosclerosis of coronary artery bypass graft(s) without angina pectoris: Secondary | ICD-10-CM

## 2013-06-03 DIAGNOSIS — I509 Heart failure, unspecified: Secondary | ICD-10-CM

## 2013-06-03 DIAGNOSIS — I5032 Chronic diastolic (congestive) heart failure: Secondary | ICD-10-CM

## 2013-06-03 DIAGNOSIS — I4891 Unspecified atrial fibrillation: Secondary | ICD-10-CM

## 2013-06-03 DIAGNOSIS — F329 Major depressive disorder, single episode, unspecified: Secondary | ICD-10-CM

## 2013-06-03 NOTE — Assessment & Plan Note (Signed)
Large pleural effusion auscultated clinically on the right, smaller on the left base. He does not want thoracentesis at this time. He reports he has had one before and tolerated this well. I suggested to he and his wife that they call our office if shortness of breath gets worse. Continue torsemide 40 mg daily at least, try to maintain weight less than 140

## 2013-06-03 NOTE — Assessment & Plan Note (Signed)
Weight is relatively stable today. 136 on his last visit, 138 pounds today. Office weight is up 1.5 pounds. Suggested he continue torsemide 40 mg daily. He does have significant pleural effusion on the right. This is likely contributing to shortness of breath

## 2013-06-03 NOTE — Assessment & Plan Note (Signed)
Heart rate relatively well-controlled. No changes to his medications

## 2013-06-03 NOTE — Assessment & Plan Note (Signed)
Blood pressure is well controlled on today's visit. No changes made to the medications. 

## 2013-06-03 NOTE — Assessment & Plan Note (Signed)
Depressed today, almost tearful at times. Discussed benefits of hospice

## 2013-06-03 NOTE — Patient Instructions (Signed)
You are doing well. No medication changes were made.  Continue torsemide two a day, Keep the weight less than 138 pounds at home  If shortness of breath gets unbearable, call the office We would do a chest Xray, consider thoracentesis.  Please call us if you have new issues that need to be addressed before your next appt.  Your physician wants you to follow-up in: 1 month.

## 2013-06-03 NOTE — Assessment & Plan Note (Signed)
Currently with no symptoms of angina. No further workup at this time. Continue current medication regimen. 

## 2013-06-03 NOTE — Progress Notes (Signed)
Patient ID: Dylan Perkins, male    DOB: 1918/04/30, 79 y.o.   MRN: [id number]  HPI Comments: 79 yo WM with history of DM, HTN, hyperlipidemia, CAD s/p CABG and redo, s/p porcine AVR with moderate residual stenosis, chronic diastolic CHF ,  chronic atrial fibrillation  on coumadin therapy,   intolerance of statins, who presents for routine followup. H/o clot to his right eye " that has caused significant vision loss on the right.  Previous  Echo showing  pulmonary HTN.   Recent hospitalization for IV diuretic therapy, with improvement in his weight and fluid status. Followup on his last clinic visit, he was stable Followup again today. Weight is 136 up to 138 pounds at home. He is taking torsemide 20, up to 40 mg daily. Previous weight was 153 pounds prior to diuresis in the hospital  Previous Admission date on April 23 2013 and discharged date 04/26/2013  Echocardiogram in the hospital showed mild to moderate sized left pleural effusion, ejection fraction 55-60%, severe pulmonary hypertension with moderate tricuspid regurg, moderate mitral valve regurg, moderately dilated left atrium, mild aortic valve stenosis.  BNP was 7700  Creatinine on arrival to the hospital was 1.67, BUN 36, potassium 4.5. At the time of discharge his creatinine was 1.34, BUN 29 with potassium 3.5  He is miserable today, reports that his "spinal stenosis" is making him very unhappy. Wife indicates there has been a reach out to hospice for assistance. He has significant difficulty in the morning, getting out of bed.  He has been to pain clinic before. He does not like narcotics.  In the past he has refused restarting warfarin.  He is aware of the risk and benefits. Aware that he could have stroke with atrial fibrillation.  EKG shows atrial fibrillation with ventricular rate 99  beats per minute, intraventricular conduction delay, old anteroseptal infarct, left axis deviation/LAFB     Outpatient Encounter Prescriptions  as of 06/03/2013  Medication Sig  . aspirin EC 81 MG tablet Take 81 mg by mouth every other day.   Marland Kitchen atenolol (TENORMIN) 25 MG tablet Take 1.5 tablets (37.5 mg total) by mouth daily.  . calcium carbonate (TUMS) 500 MG chewable tablet Chew 1 tablet by mouth daily as needed.   . docusate sodium (COLACE) 100 MG capsule Take 300 mg by mouth daily.   Marland Kitchen glipiZIDE (GLUCOTROL) 10 MG tablet TAKE 1 TABLET TWICE DAILY  . Glucose Blood (BAYER BREEZE 2 TEST) DISK TEST UP TO 4 TIMES PER DAY, dx: 250.00  . isosorbide mononitrate (IMDUR) 30 MG 24 hr tablet Take 1 tablet (30 mg total) by mouth daily.  Marland Kitchen levothyroxine (SYNTHROID, LEVOTHROID) 25 MCG tablet TAKE 1 TABLET EVERY DAY  . losartan (COZAAR) 25 MG tablet TAKE 1 TABLET (25 MG TOTAL) BY MOUTH DAILY.  . Multiple Vitamin (MULTIVITAMIN) tablet Take 1 tablet by mouth daily after breakfast.    . ranitidine (ZANTAC) 150 MG capsule Take 150 mg by mouth daily as needed.   . torsemide (DEMADEX) 20 MG tablet Take 2 tablets (40 mg total) by mouth 2 (two) times daily.     Review of Systems  Constitutional: Negative.   HENT: Negative.   Eyes: Negative.   Respiratory: Positive for shortness of breath.   Cardiovascular: Positive for leg swelling.  Endocrine: Negative.   Musculoskeletal: Positive for gait problem.  Skin: Negative.   Allergic/Immunologic: Negative.   Neurological: Negative.   Hematological: Negative.   All other systems reviewed and are negative.  BP 120/60  Ht 5\' 8"  (1.727 m)  Wt 141 lb (63.957 kg)  BMI 21.44 kg/m2  Physical Exam  Nursing note and vitals reviewed. Constitutional: He is oriented to person, place, and time. He appears well-developed and well-nourished.  HENT:  Head: Normocephalic.  Nose: Nose normal.  Mouth/Throat: Oropharynx is clear and moist.  Eyes: Conjunctivae are normal. Pupils are equal, round, and reactive to light.  Neck: Normal range of motion. Neck supple. No JVD present.  Cardiovascular: Normal rate,  S1 normal, S2 normal and intact distal pulses.  An irregularly irregular rhythm present. Exam reveals no gallop and no friction rub.   Murmur heard.  Crescendo systolic murmur is present with a grade of 2/6  1+ pitting edema to the mid shins, "woody edema"  Pulmonary/Chest: Effort normal. No respiratory distress. He has decreased breath sounds in the right middle field, the right lower field and the left lower field. He has no wheezes. He has no rales. He exhibits no tenderness.  Abdominal: Soft. Bowel sounds are normal. He exhibits no distension. There is no tenderness.  Musculoskeletal: Normal range of motion. He exhibits no edema and no tenderness.  Lymphadenopathy:    He has no cervical adenopathy.  Neurological: He is alert and oriented to person, place, and time. Coordination normal.  Skin: Skin is warm and dry. No rash noted. No erythema.  Psychiatric: He has a normal mood and affect. His behavior is normal. Judgment and thought content normal.      Assessment and Plan

## 2013-06-11 ENCOUNTER — Ambulatory Visit: Payer: Medicare Other | Admitting: Internal Medicine

## 2013-06-17 ENCOUNTER — Telehealth: Payer: Self-pay

## 2013-06-17 NOTE — Telephone Encounter (Signed)
This is a patient of Dr. Mariah Milling.

## 2013-06-17 NOTE — Telephone Encounter (Signed)
Patient is saying that he takes 1 tablet by mouth twice a day. Please advise what he is to do.

## 2013-06-17 NOTE — Telephone Encounter (Signed)
Are you asking about losartan? On my last visit, I think he was taking 25 mg once a day.

## 2013-06-17 NOTE — Telephone Encounter (Signed)
Please review medication dose.

## 2013-06-17 NOTE — Telephone Encounter (Signed)
error 

## 2013-06-17 NOTE — Telephone Encounter (Signed)
Patient states is taking Losartan Potassium 25 mg 1 tablet by mouth twice a day. Please advise which dose is correct.

## 2013-06-18 ENCOUNTER — Ambulatory Visit: Payer: Medicare Other | Admitting: Cardiovascular Disease

## 2013-06-18 NOTE — Telephone Encounter (Signed)
Spoke with patient and he is taking the Losartan 25 mg one tablet daily. Faxed the refill request regarding the Losartan back to pharmacist with update on Losartan 25 mg take one tablet daily #90 with 3 refills.

## 2013-06-21 ENCOUNTER — Ambulatory Visit (INDEPENDENT_AMBULATORY_CARE_PROVIDER_SITE_OTHER): Payer: Medicare Other | Admitting: Internal Medicine

## 2013-06-21 ENCOUNTER — Ambulatory Visit: Payer: Medicare Other | Admitting: Family Medicine

## 2013-06-21 ENCOUNTER — Encounter: Payer: Self-pay | Admitting: Internal Medicine

## 2013-06-21 VITALS — BP 102/70 | HR 80 | Temp 97.7°F | Wt 143.5 lb

## 2013-06-21 DIAGNOSIS — IMO0001 Reserved for inherently not codable concepts without codable children: Secondary | ICD-10-CM

## 2013-06-21 DIAGNOSIS — L02419 Cutaneous abscess of limb, unspecified: Secondary | ICD-10-CM

## 2013-06-21 DIAGNOSIS — I83009 Varicose veins of unspecified lower extremity with ulcer of unspecified site: Secondary | ICD-10-CM

## 2013-06-21 DIAGNOSIS — L97909 Non-pressure chronic ulcer of unspecified part of unspecified lower leg with unspecified severity: Secondary | ICD-10-CM | POA: Insufficient documentation

## 2013-06-21 DIAGNOSIS — L03115 Cellulitis of right lower limb: Secondary | ICD-10-CM | POA: Insufficient documentation

## 2013-06-21 MED ORDER — CEFTRIAXONE SODIUM 1 G IJ SOLR
1.0000 g | INTRAMUSCULAR | Status: DC
  Administered 2013-06-21: 1 g via INTRAMUSCULAR

## 2013-06-21 MED ORDER — CEFTRIAXONE SODIUM 1 G IJ SOLR: 1.0000 g | Freq: Once | INTRAMUSCULAR | Status: DC

## 2013-06-21 MED ORDER — AMOXICILLIN-POT CLAVULANATE 875-125 MG PO TABS: 1.0000 | ORAL_TABLET | Freq: Two times a day (BID) | ORAL | Status: DC

## 2013-06-21 NOTE — Assessment & Plan Note (Signed)
Obvious source of chills, sweats, and feeling bad Will give rocephin here augmentin at home Recheck 3 days Will send to [facility] as well for their info

## 2013-06-21 NOTE — Progress Notes (Signed)
Pre-visit discussion using our clinic review tool. No additional management support is needed unless otherwise documented below in the visit note.  

## 2013-06-21 NOTE — Progress Notes (Signed)
Subjective:    Patient ID: Dylan Perkins, male    DOB: 1918/04/02, 79 y.o.   MRN: [id number]  HPI Here with wife  Couldn't sleep last night Now "I'm a crazy man" 5AM he noticed a painful bruise on his right calf Then started shivering--went on for 3.5 hours Some pain across left chest---vague Trouble getting out of bed felt hungry at first--but then he couldn't eat due to feeling cold  Called nurse-- checked him out (Katie) BP slightly low No fever Some cough--no increase. No change in usual dyspnea  Some stomach upset No vomiting Felt a little better after some oatmeal  No dysuria No increase in frequency  Current Outpatient Prescriptions on File Prior to Visit  Medication Sig Dispense Refill  . aspirin EC 81 MG tablet Take 81 mg by mouth every other day.       Marland Kitchen atenolol (TENORMIN) 25 MG tablet Take 1.5 tablets (37.5 mg total) by mouth daily.  45 tablet  6  . calcium carbonate (TUMS) 500 MG chewable tablet Chew 1 tablet by mouth daily as needed.       . docusate sodium (COLACE) 100 MG capsule Take 300 mg by mouth daily.       Marland Kitchen glipiZIDE (GLUCOTROL) 10 MG tablet TAKE 1 TABLET TWICE DAILY  60 tablet  9  . Glucose Blood (BAYER BREEZE 2 TEST) DISK TEST UP TO 4 TIMES PER DAY, dx: 250.00  100 each  6  . isosorbide mononitrate (IMDUR) 30 MG 24 hr tablet Take 1 tablet (30 mg total) by mouth daily.  90 tablet  3  . levothyroxine (SYNTHROID, LEVOTHROID) 25 MCG tablet TAKE 1 TABLET EVERY DAY  90 tablet  3  . losartan (COZAAR) 25 MG tablet TAKE 1 TABLET (25 MG TOTAL) BY MOUTH DAILY.  90 tablet  4  . Multiple Vitamin (MULTIVITAMIN) tablet Take 1 tablet by mouth daily after breakfast.        . ranitidine (ZANTAC) 150 MG capsule Take 150 mg by mouth daily as needed.       . torsemide (DEMADEX) 20 MG tablet Take 2 tablets (40 mg total) by mouth 2 (two) times daily.  120 tablet  6   No current facility-administered medications on file prior to visit.    Allergies  Allergen Reactions    . Lisinopril     REACTION: cough    Past Medical History  Diagnosis Date  . Allergic rhinitis   . Anemia     NOS  . CAD (coronary artery disease)   . DM II (diabetes mellitus, type II), controlled   . HLD (hyperlipidemia)   . HTN (hypertension)   . OA (osteoarthritis)   . BPH (benign prostatic hypertrophy)   . Diverticulosis of colon     with repeated bleeds  . Atrial fibrillation or flutter 11/09  . Depression   . CHF (congestive heart failure)   . Thyroid disease   . GI bleed 2013    Non determined  . Pulmonary hypertension 2013    On Echo 07/2011  . Aortic stenosis   . Heart murmur   . Spinal stenosis     Past Surgical History  Procedure Laterality Date  . Tonsillectomy and adenoidectomy  1933  . Coronary artery bypass graft  1995  . Coronary artery bypass graft  1999    Redo  . Hemorrhoid surgery  1974  . Transurethral resection of prostate  1993  . Nasal stenosis repair  1996  . Aortic  valve replacement  1999    pig valve  . Total shoulder replacement  1992    right  . Retinal vitrectomy      Left  . Cataract extraction  1999  . Carcinoma      head    Family History  Problem Relation Age of Onset  . Cancer Mother     (male)  . Parkinsonism Maternal Grandfather   . Mental retardation Son   . Colon cancer Neg Hx     History   Social History  . Marital Status: Married    Spouse Name: N/A    Number of Children: 5  . Years of Education: N/A   Occupational History  . 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 Smoking status: Former Smoker    Quit date: 07/18/1950  . Smokeless tobacco: Never Used  . Alcohol Use: Yes     Comment: one daily   . Drug Use: No  . Sexual Activity: Not on file   Other Topics Concern  . Not on file   Social History Narrative   Has living will   Wife has health care POA---then son, Weston Brass   Requests DNR and order done   Would not want a feeding tube   Daily caffeine      Review of Systems No rash  No injury to account for sudden bruise    Objective:   Physical Exam  Constitutional: He appears well-developed and well-nourished. No distress.  Neck: Normal range of motion.  Pulmonary/Chest: Effort normal and breath sounds normal. No respiratory distress. He has no wheezes. He has no rales.  Abdominal: Soft. There is no tenderness.  Musculoskeletal: He exhibits edema.  1+ edema  Lymphadenopathy:    He has no cervical adenopathy.  Skin:  Several open areas in calves Redness, warmth and tenderness from upper right calf on right to ankle No discharge from ulcer          Assessment & Plan:

## 2013-06-21 NOTE — Addendum Note (Signed)
Addended by: Criselda Peaches B on: 06/21/2013 01:26 PM   Modules accepted: Orders, Medications

## 2013-06-21 NOTE — Assessment & Plan Note (Signed)
Not bad but secondary infection Discussed elevation of legs Will recheck in 3 days and decide if local Rx needed

## 2013-06-24 ENCOUNTER — Encounter: Payer: Self-pay | Admitting: Internal Medicine

## 2013-06-24 ENCOUNTER — Ambulatory Visit (INDEPENDENT_AMBULATORY_CARE_PROVIDER_SITE_OTHER): Payer: Medicare Other | Admitting: Internal Medicine

## 2013-06-24 VITALS — BP 102/62 | HR 72 | Temp 96.3°F | Wt 146.5 lb

## 2013-06-24 DIAGNOSIS — I5032 Chronic diastolic (congestive) heart failure: Secondary | ICD-10-CM

## 2013-06-24 DIAGNOSIS — L03115 Cellulitis of right lower limb: Secondary | ICD-10-CM

## 2013-06-24 DIAGNOSIS — L02419 Cutaneous abscess of limb, unspecified: Secondary | ICD-10-CM

## 2013-06-24 DIAGNOSIS — I509 Heart failure, unspecified: Secondary | ICD-10-CM

## 2013-06-24 NOTE — Patient Instructions (Signed)
Please take the torsemide 2 tabs twice a day again till your weight is under 135# at home.

## 2013-06-24 NOTE — Assessment & Plan Note (Signed)
He is having increasing problems More trouble with personal care Getting frustrated with aggressive care--- asks for hospice This seems appropriate---sleeps all day, increasing care needs, difficult CHF

## 2013-06-24 NOTE — Progress Notes (Signed)
Pre-visit discussion using our clinic review tool. No additional management support is needed unless otherwise documented below in the visit note.  

## 2013-06-24 NOTE — Progress Notes (Signed)
Subjective:    Patient ID: Dylan Perkins, male    DOB: [birth year], 79 y.o.   MRN: [id number]  HPI Here with wife Does feel some better Right leg has improved some  Still had a sweat last night No fever Still has pain in the leg---tender at least Some pain when walking also  Current Outpatient Prescriptions on File Prior to Visit  Medication Sig Dispense Refill  . amoxicillin-clavulanate (AUGMENTIN) 875-125 MG per tablet Take 1 tablet by mouth 2 (two) times daily.  20 tablet  1  . aspirin EC 81 MG tablet Take 81 mg by mouth every other day.       Marland Kitchen atenolol (TENORMIN) 25 MG tablet Take 1.5 tablets (37.5 mg total) by mouth daily.  45 tablet  6  . calcium carbonate (TUMS) 500 MG chewable tablet Chew 1 tablet by mouth daily as needed.       . cefTRIAXone (ROCEPHIN) 1 G injection Inject 1 g into the muscle once.  1 each  0  . docusate sodium (COLACE) 100 MG capsule Take 300 mg by mouth daily.       Marland Kitchen glipiZIDE (GLUCOTROL) 10 MG tablet TAKE 1 TABLET TWICE DAILY  60 tablet  9  . Glucose Blood (BAYER BREEZE 2 TEST) DISK TEST UP TO 4 TIMES PER DAY, dx: 250.00  100 each  6  . isosorbide mononitrate (IMDUR) 30 MG 24 hr tablet Take 1 tablet (30 mg total) by mouth daily.  90 tablet  3  . levothyroxine (SYNTHROID, LEVOTHROID) 25 MCG tablet TAKE 1 TABLET EVERY DAY  90 tablet  3  . losartan (COZAAR) 25 MG tablet TAKE 1 TABLET (25 MG TOTAL) BY MOUTH DAILY.  90 tablet  4  . Multiple Vitamin (MULTIVITAMIN) tablet Take 1 tablet by mouth daily after breakfast.        . ranitidine (ZANTAC) 150 MG capsule Take 150 mg by mouth daily as needed.       . torsemide (DEMADEX) 20 MG tablet Take 2 tablets (40 mg total) by mouth 2 (two) times daily.  120 tablet  6   No current facility-administered medications on file prior to visit.    Allergies  Allergen Reactions  . Lisinopril     REACTION: cough    Past Medical History  Diagnosis Date  . Allergic rhinitis   . Anemia     NOS  . CAD (coronary artery  disease)   . DM II (diabetes mellitus, type II), controlled   . HLD (hyperlipidemia)   . HTN (hypertension)   . OA (osteoarthritis)   . BPH (benign prostatic hypertrophy)   . Diverticulosis of colon     with repeated bleeds  . Atrial fibrillation or flutter 11/09  . Depression   . CHF (congestive heart failure)   . Thyroid disease   . GI bleed 2013    Non determined  . Pulmonary hypertension 2013    On Echo 07/2011  . Aortic stenosis   . Heart murmur   . Spinal stenosis     Past Surgical History  Procedure Laterality Date  . Tonsillectomy and adenoidectomy  1933  . Coronary artery bypass graft  1995  . Coronary artery bypass graft  1999    Redo  . Hemorrhoid surgery  1974  . Transurethral resection of prostate  1993  . Nasal stenosis repair  1996  . Aortic valve replacement  1999    pig valve  . Total shoulder replacement  1992  right  . Retinal vitrectomy      Left  . Cataract extraction  1999  . Carcinoma      head    Family History  Problem Relation Age of Onset  . Cancer Mother     (male)  . Parkinsonism Maternal Grandfather   . Mental retardation Son   . Colon cancer Neg Hx     History   Social History  . Marital Status: Married    Spouse Name: N/A    Number of Children: 5  . Years of Education: N/A   Occupational History  . 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 Smoking status: Former Smoker    Quit date: 07/18/1950  . Smokeless tobacco: Never Used  . Alcohol Use: Yes     Comment: one daily   . Drug Use: No  . Sexual Activity: Not on file   Other Topics Concern  . Not on file   Social History Narrative   Has living will   Wife has health care POA---then son, Weston Brass   Requests DNR and order done   Would not want a feeding tube   Daily caffeine    Review of Systems No diarrhea Appetite is fair---not eating very much Breathing is okay    Objective:   Physical Exam  Constitutional: He appears  well-developed. No distress.  Musculoskeletal: He exhibits edema.  2+ tense edema in calves  Skin:  Still with deep redness surrounding clean ulcer in mid anterior left calf Not warm  Ulcer with slough over knee---but no surrounding inflammation          Assessment & Plan:

## 2013-06-24 NOTE — Assessment & Plan Note (Signed)
Clearly better but still painful---this may be due to the edema Infection is better Will continue the augmentin  Will have him increase the demadex back to 40mg  bid till weight down again and leg less swollen

## 2013-06-25 ENCOUNTER — Ambulatory Visit (INDEPENDENT_AMBULATORY_CARE_PROVIDER_SITE_OTHER): Payer: Medicare Other | Admitting: Podiatry

## 2013-06-25 ENCOUNTER — Encounter: Payer: Self-pay | Admitting: Podiatry

## 2013-06-25 VITALS — BP 146/82 | HR 77 | Resp 20

## 2013-06-25 DIAGNOSIS — B351 Tinea unguium: Secondary | ICD-10-CM

## 2013-06-25 DIAGNOSIS — M79609 Pain in unspecified limb: Secondary | ICD-10-CM

## 2013-06-25 NOTE — Progress Notes (Signed)
   Subjective:    Patient ID: Dylan Perkins, male    DOB: 08-29-1917, 79 y.o.   MRN: [id number]  HPI Comments: " trim the toenails "       Review of Systems     Objective:   Physical Exam        Assessment & Plan:

## 2013-06-26 MED ORDER — CEFTRIAXONE SODIUM 1 G IJ SOLR: 1.0000 g | Freq: Once | INTRAMUSCULAR | Status: DC

## 2013-06-26 MED ORDER — CEFTRIAXONE SODIUM 1 G IJ SOLR
1.0000 g | Freq: Once | INTRAMUSCULAR | Status: AC
  Administered 2013-06-21: 1 g via INTRAMUSCULAR

## 2013-06-26 NOTE — Progress Notes (Signed)
Subjective:     Patient ID: Dylan Perkins, male   DOB: 29-Oct-1917, 79 y.o.   MRN: [id number]  HPI patient presents with painful nail disease 1-5 both feet that are thick and he cannot cut   Review of Systems     Objective:   Physical Exam Neurovascular status unchanged with thick nail disease that are painful 1-5 both feet    Assessment:     Chronic mycotic nail infection with pain 1-5 both feet    Plan:     Debridement painful nailbeds 1-5 both feet with no iatrogenic bleeding noted

## 2013-06-26 NOTE — Addendum Note (Signed)
Addended by: Criselda Peaches B on: 06/26/2013 11:55 AM   Modules accepted: Orders, Medications

## 2013-06-28 ENCOUNTER — Telehealth: Payer: Self-pay

## 2013-06-28 NOTE — Telephone Encounter (Signed)
Noted  

## 2013-06-28 NOTE — Telephone Encounter (Signed)
Amil Amen with [facility] left v/m; [facility] had received referral by Dr Alphonsus Sias; pts wife was not sure yesterday about pt being under hospice care and today pt's wife has refused services. Amil Amen advised Mrs Hidrogo if she changes her mind to contact Dr Fredric Mare office or contact [facility]. Amil Amen wanted to let Dr Alphonsus Sias know pt has not been admitted to hospice services due to Mrs [facility] decision.

## 2013-07-02 ENCOUNTER — Ambulatory Visit (INDEPENDENT_AMBULATORY_CARE_PROVIDER_SITE_OTHER): Payer: Medicare Other | Admitting: Internal Medicine

## 2013-07-02 ENCOUNTER — Encounter: Payer: Self-pay | Admitting: Internal Medicine

## 2013-07-02 VITALS — BP 120/60 | HR 102 | Temp 97.9°F | Wt 150.0 lb

## 2013-07-02 DIAGNOSIS — I5033 Acute on chronic diastolic (congestive) heart failure: Secondary | ICD-10-CM

## 2013-07-02 DIAGNOSIS — I4891 Unspecified atrial fibrillation: Secondary | ICD-10-CM

## 2013-07-02 DIAGNOSIS — L03115 Cellulitis of right lower limb: Secondary | ICD-10-CM

## 2013-07-02 DIAGNOSIS — L02419 Cutaneous abscess of limb, unspecified: Secondary | ICD-10-CM

## 2013-07-02 MED ORDER — METOLAZONE 2.5 MG PO TABS: 2.5000 mg | ORAL_TABLET | ORAL | Status: AC

## 2013-07-02 NOTE — Progress Notes (Signed)
Subjective:    Patient ID: Dylan Perkins, male    DOB: September 23, 1917, 79 y.o.   MRN: [id number]  HPI Here with wife Not sure his leg is better  Having trouble with "my belly" Hard to eat anything  Breathing is labored Not able to walk at all without dyspnea Comfortable at rest Sleeps in bed and chair  Still troubled by "dream hallucinations" Keeps wife up---she seems to be worn out  Current Outpatient Prescriptions on File Prior to Visit  Medication Sig Dispense Refill  . aspirin EC 81 MG tablet Take 81 mg by mouth every other day.       Marland Kitchen atenolol (TENORMIN) 25 MG tablet Take 1.5 tablets (37.5 mg total) by mouth daily.  45 tablet  6  . calcium carbonate (TUMS) 500 MG chewable tablet Chew 1 tablet by mouth daily as needed.       . docusate sodium (COLACE) 100 MG capsule Take 300 mg by mouth daily.       Marland Kitchen glipiZIDE (GLUCOTROL) 10 MG tablet TAKE 1 TABLET TWICE DAILY  60 tablet  9  . Glucose Blood (BAYER BREEZE 2 TEST) DISK TEST UP TO 4 TIMES PER DAY, dx: 250.00  100 each  6  . isosorbide mononitrate (IMDUR) 30 MG 24 hr tablet Take 1 tablet (30 mg total) by mouth daily.  90 tablet  3  . levothyroxine (SYNTHROID, LEVOTHROID) 25 MCG tablet TAKE 1 TABLET EVERY DAY  90 tablet  3  . losartan (COZAAR) 25 MG tablet TAKE 1 TABLET (25 MG TOTAL) BY MOUTH DAILY.  90 tablet  4  . Multiple Vitamin (MULTIVITAMIN) tablet Take 1 tablet by mouth daily after breakfast.        . ranitidine (ZANTAC) 150 MG capsule Take 150 mg by mouth daily as needed.       . torsemide (DEMADEX) 20 MG tablet Take 2 tablets (40 mg total) by mouth 2 (two) times daily.  120 tablet  6   No current facility-administered medications on file prior to visit.    Allergies  Allergen Reactions  . Lisinopril     REACTION: cough    Past Medical History  Diagnosis Date  . Allergic rhinitis   . Anemia     NOS  . CAD (coronary artery disease)   . DM II (diabetes mellitus, type II), controlled   . HLD (hyperlipidemia)   .  HTN (hypertension)   . OA (osteoarthritis)   . BPH (benign prostatic hypertrophy)   . Diverticulosis of colon     with repeated bleeds  . Atrial fibrillation or flutter 11/09  . Depression   . CHF (congestive heart failure)   . Thyroid disease   . GI bleed 2013    Non determined  . Pulmonary hypertension 2013    On Echo 07/2011  . Aortic stenosis   . Heart murmur   . Spinal stenosis     Past Surgical History  Procedure Laterality Date  . Tonsillectomy and adenoidectomy  1933  . Coronary artery bypass graft  1995  . Coronary artery bypass graft  1999    Redo  . Hemorrhoid surgery  1974  . Transurethral resection of prostate  1993  . Nasal stenosis repair  1996  . Aortic valve replacement  1999    pig valve  . Total shoulder replacement  1992    right  . Retinal vitrectomy      Left  . Cataract extraction  1999  . Carcinoma  head    Family History  Problem Relation Age of Onset  . Cancer Mother     (male)  . Parkinsonism Maternal Grandfather   . Mental retardation Son   . Colon cancer Neg Hx     History   Social History  . Marital Status: Married    Spouse Name: N/A    Number of Children: 5  . Years of Education: N/A   Occupational History  . 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 Smoking status: Former Smoker    Quit date: 07/18/1950  . Smokeless tobacco: Never Used  . Alcohol Use: Yes     Comment: one daily   . Drug Use: No  . Sexual Activity: Not on file   Other Topics Concern  . Not on file   Social History Narrative   Has living will   Wife has health care POA---then son, Weston Brass   Requests DNR and order done   Would not want a feeding tube   Daily caffeine    Review of Systems No fever Decided to hold off on hospice care--- only offered 2 hours twice a week of aide    Objective:   Physical Exam  Constitutional: He appears well-developed.  Looks uncomfortable Very slow walking  Neck: Normal  range of motion.  Cardiovascular: Normal rate.  Exam reveals no gallop.   No murmur heard. irregular  Pulmonary/Chest: No respiratory distress. He has no wheezes.  Dull at right base Decreased breath sounds at both bases  Musculoskeletal: He exhibits edema.  Slight edema  Lymphadenopathy:    He has no cervical adenopathy.  Skin:  Redness of right calf is better Still with several small open areas where previous infection was---no eschar yet  Psychiatric:  Upset and ill at ease          Assessment & Plan:

## 2013-07-02 NOTE — Patient Instructions (Signed)
Please make sure he gets the torsemide-- 2 tabs twice a day. Add the metolazone-- 1 tab with the morning torsemide on Wednesday and Sunday. I will schedule a home visit after the New Year

## 2013-07-02 NOTE — Assessment & Plan Note (Signed)
Rate is controlled No coumadin by his choice---given overall status, probably the best plan

## 2013-07-02 NOTE — Progress Notes (Signed)
Pre-visit discussion using our clinic review tool. No additional management support is needed unless otherwise documented below in the visit note.  

## 2013-07-02 NOTE — Assessment & Plan Note (Signed)
Cellulitis is resolved Still some open areas Dressing reapplied

## 2013-07-02 NOTE — Assessment & Plan Note (Signed)
Weight is up again Seems to be mostly in pleural effusions--thus worsening his breathing. Suggests particularly worse left sided disease Will add twice a week metolazone Will need home visits from now on

## 2013-07-03 ENCOUNTER — Ambulatory Visit: Payer: Medicare Other | Admitting: Cardiovascular Disease

## 2013-07-04 ENCOUNTER — Inpatient Hospital Stay: Payer: Self-pay | Admitting: Internal Medicine

## 2013-07-04 LAB — COMPREHENSIVE METABOLIC PANEL
Albumin: 2.5 g/dL — ABNORMAL LOW (ref 3.4–5.0)
Alkaline Phosphatase: 168 U/L — ABNORMAL HIGH
BUN: 36 mg/dL — ABNORMAL HIGH (ref 7–18)
Bilirubin,Total: 0.6 mg/dL (ref 0.2–1.0)
Calcium, Total: 9.1 mg/dL (ref 8.5–10.1)
EGFR (African American): 43 — ABNORMAL LOW
Glucose: 167 mg/dL — ABNORMAL HIGH (ref 65–99)
Osmolality: 275 (ref 275–301)
SGOT(AST): 45 U/L — ABNORMAL HIGH (ref 15–37)
SGPT (ALT): 35 U/L (ref 12–78)
Total Protein: 6.8 g/dL (ref 6.4–8.2)

## 2013-07-04 LAB — CBC
HCT: 37.6 % — ABNORMAL LOW (ref 40.0–52.0)
HGB: 12.6 g/dL — ABNORMAL LOW (ref 13.0–18.0)
MCHC: 33.5 g/dL (ref 32.0–36.0)
MCV: 99 fL (ref 80–100)
RBC: 3.81 10*6/uL — ABNORMAL LOW (ref 4.40–5.90)

## 2013-07-04 LAB — DIFFERENTIAL
Basophil %: 0.2 %
Eosinophil #: 0 10*3/uL (ref 0.0–0.7)
Lymphocyte %: 12.7 %
Monocyte %: 5.9 %
Neutrophil #: 4.9 10*3/uL (ref 1.4–6.5)

## 2013-07-04 LAB — URINALYSIS, COMPLETE
Bacteria: NONE SEEN
Bilirubin,UR: NEGATIVE
Glucose,UR: NEGATIVE mg/dL (ref 0–75)
Hyaline Cast: 12
Ketone: NEGATIVE
Leukocyte Esterase: NEGATIVE
Nitrite: NEGATIVE
Ph: 7 (ref 4.5–8.0)
RBC,UR: 1 /HPF (ref 0–5)
Specific Gravity: 1.006 (ref 1.003–1.030)
Squamous Epithelial: NONE SEEN

## 2013-07-04 LAB — CK: CK, Total: 49 U/L (ref 35–232)

## 2013-07-04 LAB — CK-MB: CK-MB: 3.1 ng/mL (ref 0.5–3.6)

## 2013-07-04 LAB — TROPONIN I: Troponin-I: 0.12 ng/mL — ABNORMAL HIGH

## 2013-07-05 ENCOUNTER — Ambulatory Visit: Payer: Self-pay | Admitting: Internal Medicine

## 2013-07-05 DIAGNOSIS — I4891 Unspecified atrial fibrillation: Secondary | ICD-10-CM

## 2013-07-05 DIAGNOSIS — J90 Pleural effusion, not elsewhere classified: Secondary | ICD-10-CM

## 2013-07-05 DIAGNOSIS — I5033 Acute on chronic diastolic (congestive) heart failure: Secondary | ICD-10-CM

## 2013-07-05 DIAGNOSIS — N183 Chronic kidney disease, stage 3 (moderate): Secondary | ICD-10-CM

## 2013-07-05 DIAGNOSIS — R7989 Other specified abnormal findings of blood chemistry: Secondary | ICD-10-CM

## 2013-07-05 LAB — COMPREHENSIVE METABOLIC PANEL
Albumin: 2.6 g/dL — ABNORMAL LOW (ref 3.4–5.0)
Alkaline Phosphatase: 170 U/L — ABNORMAL HIGH
Anion Gap: 11 (ref 7–16)
Bilirubin,Total: 0.6 mg/dL (ref 0.2–1.0)
Creatinine: 1.57 mg/dL — ABNORMAL HIGH (ref 0.60–1.30)
EGFR (African American): 43 — ABNORMAL LOW
EGFR (Non-African Amer.): 37 — ABNORMAL LOW
Osmolality: 280 (ref 275–301)
Potassium: 3.5 mmol/L (ref 3.5–5.1)
SGOT(AST): 44 U/L — ABNORMAL HIGH (ref 15–37)
SGPT (ALT): 35 U/L (ref 12–78)

## 2013-07-05 LAB — BODY FLUID CELL COUNT WITH DIFFERENTIAL
Basophil: 0 %
Eosinophil: 0 %
Lymphocytes: 41 %
Neutrophils: 27 %
Nucleated Cell Count: 21 /mm 3
Other Cells BF: 0 %
Other Mononuclear Cells: 32 %

## 2013-07-05 LAB — CBC WITH DIFFERENTIAL/PLATELET
Basophil #: 0 10*3/uL (ref 0.0–0.1)
Basophil %: 0.4 %
Eosinophil #: 0 10*3/uL (ref 0.0–0.7)
Eosinophil %: 0.6 %
MCH: 31.9 pg (ref 26.0–34.0)
MCHC: 32.4 g/dL (ref 32.0–36.0)
MCV: 99 fL (ref 80–100)
Monocyte #: 0.4 x10 3/mm (ref 0.2–1.0)
Neutrophil #: 5 10*3/uL (ref 1.4–6.5)
Platelet: 174 10*3/uL (ref 150–440)
RBC: 3.81 10*6/uL — ABNORMAL LOW (ref 4.40–5.90)
RDW: 15.4 % — ABNORMAL HIGH (ref 11.5–14.5)

## 2013-07-05 LAB — CK-MB: CK-MB: 3.4 ng/mL (ref 0.5–3.6)

## 2013-07-05 LAB — LACTATE DEHYDROGENASE, PLEURAL OR PERITONEAL FLUID: LDH, Body Fluid: 73 U/L

## 2013-07-05 LAB — ALBUMIN, FLUID (OTHER): Body Fluid Albumin: 1.3 g/dL

## 2013-07-05 LAB — AMYLASE, BODY FLUID: Amylase, Body Fluid: 27 U/L

## 2013-07-07 LAB — BASIC METABOLIC PANEL
Chloride: 89 mmol/L — ABNORMAL LOW (ref 98–107)
Creatinine: 1.39 mg/dL — ABNORMAL HIGH (ref 0.60–1.30)
Glucose: 87 mg/dL (ref 65–99)
Osmolality: 264 (ref 275–301)
Sodium: 128 mmol/L — ABNORMAL LOW (ref 136–145)

## 2013-07-08 ENCOUNTER — Ambulatory Visit: Payer: Medicare Other | Admitting: Internal Medicine

## 2013-07-08 ENCOUNTER — Ambulatory Visit: Payer: Self-pay | Admitting: Family Medicine

## 2013-07-08 DIAGNOSIS — I4891 Unspecified atrial fibrillation: Secondary | ICD-10-CM

## 2013-07-08 LAB — BASIC METABOLIC PANEL
BUN: 28 mg/dL — ABNORMAL HIGH (ref 7–18)
Calcium, Total: 8.5 mg/dL (ref 8.5–10.1)
EGFR (African American): 59 — ABNORMAL LOW
EGFR (Non-African Amer.): 51 — ABNORMAL LOW
Potassium: 3.5 mmol/L (ref 3.5–5.1)

## 2013-07-09 LAB — BASIC METABOLIC PANEL
Calcium, Total: 8.5 mg/dL (ref 8.5–10.1)
Co2: 37 mmol/L — ABNORMAL HIGH (ref 21–32)
Creatinine: 1.24 mg/dL (ref 0.60–1.30)
EGFR (African American): 58 — ABNORMAL LOW
Glucose: 96 mg/dL (ref 65–99)
Potassium: 3.5 mmol/L (ref 3.5–5.1)
Sodium: 127 mmol/L — ABNORMAL LOW (ref 136–145)

## 2013-07-09 LAB — PLATELET COUNT: Platelet: 167 10*3/uL (ref 150–440)

## 2013-07-09 LAB — TSH: Thyroid Stimulating Horm: 43.6 u[IU]/mL — ABNORMAL HIGH

## 2013-07-10 ENCOUNTER — Telehealth: Payer: Self-pay | Admitting: Family Medicine

## 2013-07-10 ENCOUNTER — Telehealth: Payer: Self-pay

## 2013-07-10 ENCOUNTER — Inpatient Hospital Stay: Payer: Self-pay | Admitting: Internal Medicine

## 2013-07-10 LAB — COMPREHENSIVE METABOLIC PANEL
Albumin: 2.7 g/dL — ABNORMAL LOW (ref 3.4–5.0)
Alkaline Phosphatase: 259 U/L — ABNORMAL HIGH
Anion Gap: 3 — ABNORMAL LOW (ref 7–16)
BUN: 30 mg/dL — ABNORMAL HIGH (ref 7–18)
Bilirubin,Total: 0.9 mg/dL (ref 0.2–1.0)
Chloride: 88 mmol/L — ABNORMAL LOW (ref 98–107)
Co2: 37 mmol/L — ABNORMAL HIGH (ref 21–32)
EGFR (African American): 49 — ABNORMAL LOW
Glucose: 131 mg/dL — ABNORMAL HIGH (ref 65–99)
Potassium: 3.7 mmol/L (ref 3.5–5.1)
SGPT (ALT): 52 U/L (ref 12–78)
Total Protein: 6.8 g/dL (ref 6.4–8.2)

## 2013-07-10 LAB — URINALYSIS, COMPLETE
Blood: NEGATIVE
Hyaline Cast: 54
Ph: 6 (ref 4.5–8.0)
Specific Gravity: 1.023 (ref 1.003–1.030)
Squamous Epithelial: <1
WBC UR: 1 /HPF (ref 0–5)

## 2013-07-10 LAB — CBC
HGB: 12.3 g/dL — ABNORMAL LOW (ref 13.0–18.0)
MCH: 32.2 pg (ref 26.0–34.0)
MCHC: 33 g/dL (ref 32.0–36.0)
MCV: 98 fL (ref 80–100)
Platelet: 159 10*3/uL (ref 150–440)
RBC: 3.83 10*6/uL — ABNORMAL LOW (ref 4.40–5.90)

## 2013-07-10 LAB — PRO B NATRIURETIC PEPTIDE: B-Type Natriuretic Peptide: 9220 pg/mL — ABNORMAL HIGH (ref 0–450)

## 2013-07-10 LAB — CK TOTAL AND CKMB (NOT AT ARMC): CK-MB: 3 ng/mL (ref 0.5–3.6)

## 2013-07-10 LAB — PROTIME-INR: INR: 1.1

## 2013-07-10 LAB — APTT: Activated PTT: 35.5 secs (ref 23.6–35.9)

## 2013-07-10 NOTE — Telephone Encounter (Signed)
In ER now getting stitches I plan to see him at [facility] on Friday afternoon

## 2013-07-10 NOTE — Telephone Encounter (Signed)
Confidential Office Message [address] [place], [place] [address] p. [phone] f. [phone] To: [facility] (After Hours Triage) Fax: [phone] From: [facility] Date/ Time: 07/10/2013 6:51 AM Taken By: Meribeth Mattes, RN Caller: Lanora Manis Facility: Not Collected Patient: Dylan Perkins, Dylan Perkins DOB: 10/18/1917 Phone: [phone] Reason for Call: Caller was unable to be reached on callback - Busy Regarding Appointment: No Appt Date: Appt Time: Unknown Provider: Reason: Details: Outcome: Confidential

## 2013-07-10 NOTE — Telephone Encounter (Signed)
[facility] Triage Call Report Triage Record Num: [id number] Operator: Gypsy Decant Patient Name: Dylan Perkins Call Date & Time: 07/10/2013 6:58:16AM Patient Phone: [phone] PCP: Tillman Abide Patient Gender: Male PCP Fax : [phone] Patient DOB: [birth year] Practice Name: [facility] Reason for Call: Caller: Elizabeth/RN; PCP: Hannah Beat (Family Practice); CB#: [phone]; Call regarding Fall/needs stitches at top of head. Missed cb , sending out for stitches; RN refused triage. States they have already called 911 and EMS is present to take pt to the hospital. Protocol(s) Used: Office Note Recommended Outcome per Protocol: Information Noted and Sent to Office Reason for Outcome: Caller information to office Care Advice: ~ 07/10/2013 7:01:31AM Page 1 of 1 [id number]

## 2013-07-10 NOTE — Telephone Encounter (Signed)
Patient contacted regarding discharge from [facility] on 07/09/13.  Patient understands to follow up with provider Dr. Kirke Corin on 07/22/13 at 2:30 at [facility]. Patient understands discharge instructions? yes Patient understands medications and regiment? yes Patient understands to bring all medications to this visit? yes  Wife reports that pt fell and is back over at [facility] w/ fluid in his lungs, pt to be admitted again.

## 2013-07-11 DIAGNOSIS — I5033 Acute on chronic diastolic (congestive) heart failure: Secondary | ICD-10-CM

## 2013-07-11 DIAGNOSIS — I4891 Unspecified atrial fibrillation: Secondary | ICD-10-CM

## 2013-07-11 DIAGNOSIS — R7989 Other specified abnormal findings of blood chemistry: Secondary | ICD-10-CM

## 2013-07-11 LAB — BASIC METABOLIC PANEL
BUN: 29 mg/dL — ABNORMAL HIGH (ref 7–18)
Calcium, Total: 8.3 mg/dL — ABNORMAL LOW (ref 8.5–10.1)
Co2: 36 mmol/L — ABNORMAL HIGH (ref 21–32)
Creatinine: 1.25 mg/dL (ref 0.60–1.30)
EGFR (African American): 57 — ABNORMAL LOW
EGFR (Non-African Amer.): 49 — ABNORMAL LOW
Glucose: 94 mg/dL (ref 65–99)
Sodium: 131 mmol/L — ABNORMAL LOW (ref 136–145)

## 2013-07-11 LAB — MAGNESIUM: Magnesium: 1.6 mg/dL — ABNORMAL LOW

## 2013-07-12 LAB — BASIC METABOLIC PANEL
Anion Gap: 2 — ABNORMAL LOW (ref 7–16)
BUN: 26 mg/dL — ABNORMAL HIGH (ref 7–18)
Co2: 37 mmol/L — ABNORMAL HIGH (ref 21–32)
Glucose: 94 mg/dL (ref 65–99)
Osmolality: 267 (ref 275–301)
Potassium: 3.7 mmol/L (ref 3.5–5.1)
Sodium: 131 mmol/L — ABNORMAL LOW (ref 136–145)

## 2013-07-13 DIAGNOSIS — I214 Non-ST elevation (NSTEMI) myocardial infarction: Secondary | ICD-10-CM

## 2013-07-13 LAB — CK-MB
CK-MB: 8.6 ng/mL — ABNORMAL HIGH (ref 0.5–3.6)
CK-MB: 7.5 ng/mL — ABNORMAL HIGH (ref 0.5–3.6)
CK-MB: 6.8 ng/mL — ABNORMAL HIGH (ref 0.5–3.6)

## 2013-07-13 LAB — TSH: Thyroid Stimulating Horm: 21.4 u[IU]/mL — ABNORMAL HIGH

## 2013-07-13 LAB — T4, FREE: Free Thyroxine: 0.88 ng/dL

## 2013-07-13 LAB — HEMOGLOBIN: HGB: 10.9 g/dL — ABNORMAL LOW (ref 13.0–18.0)

## 2013-07-13 LAB — COMPREHENSIVE METABOLIC PANEL
Anion Gap: 2 — ABNORMAL LOW (ref 7–16)
Calcium, Total: 7.9 mg/dL — ABNORMAL LOW (ref 8.5–10.1)
SGOT(AST): 58 U/L — ABNORMAL HIGH (ref 15–37)
Sodium: 132 mmol/L — ABNORMAL LOW (ref 136–145)

## 2013-07-13 LAB — DIGOXIN LEVEL: Digoxin: 0.84 ng/mL

## 2013-07-14 LAB — COMPREHENSIVE METABOLIC PANEL
Albumin: 2.2 g/dL — ABNORMAL LOW (ref 3.4–5.0)
Alkaline Phosphatase: 206 U/L — ABNORMAL HIGH
BUN: 33 mg/dL — ABNORMAL HIGH (ref 7–18)
Bilirubin,Total: 0.6 mg/dL (ref 0.2–1.0)
Chloride: 92 mmol/L — ABNORMAL LOW (ref 98–107)
EGFR (Non-African Amer.): 36 — ABNORMAL LOW
Glucose: 133 mg/dL — ABNORMAL HIGH (ref 65–99)
Osmolality: 274 (ref 275–301)
Potassium: 3.8 mmol/L (ref 3.5–5.1)
Sodium: 132 mmol/L — ABNORMAL LOW (ref 136–145)
Total Protein: 6.2 g/dL — ABNORMAL LOW (ref 6.4–8.2)

## 2013-07-14 LAB — PLATELET COUNT: Platelet: 139 10*3/uL — ABNORMAL LOW (ref 150–440)

## 2013-07-16 DIAGNOSIS — N184 Chronic kidney disease, stage 4 (severe): Secondary | ICD-10-CM

## 2013-07-16 DIAGNOSIS — L03119 Cellulitis of unspecified part of limb: Secondary | ICD-10-CM

## 2013-07-16 DIAGNOSIS — L02419 Cutaneous abscess of limb, unspecified: Secondary | ICD-10-CM

## 2013-07-16 DIAGNOSIS — I5043 Acute on chronic combined systolic (congestive) and diastolic (congestive) heart failure: Secondary | ICD-10-CM

## 2013-07-16 DIAGNOSIS — I4891 Unspecified atrial fibrillation: Secondary | ICD-10-CM

## 2013-07-17 ENCOUNTER — Ambulatory Visit: Payer: Medicare Other | Admitting: Internal Medicine

## 2013-07-17 ENCOUNTER — Telehealth: Payer: Self-pay

## 2013-07-17 NOTE — Telephone Encounter (Signed)
Dylan Perkins is a Hospice pt and wife does not want him to go to any "unneccessary" appts.

## 2013-07-17 NOTE — Telephone Encounter (Signed)
FYI.Marland KitchenMarland KitchenPt wife called and cx tcm appt for Monday 07/22/2013. States Dr, Alphonsus Sias is taking over his care, states she is aware he is only primary care. States pt will not be able to make any appt.

## 2013-07-18 ENCOUNTER — Ambulatory Visit: Payer: Self-pay | Admitting: Internal Medicine

## 2013-07-22 ENCOUNTER — Encounter: Payer: Medicare Other | Admitting: Cardiovascular Disease

## 2013-08-18 DEATH — deceased

## 2013-09-24 ENCOUNTER — Ambulatory Visit: Payer: Medicare Other | Admitting: Podiatry

## 2014-10-17 IMAGING — CR DG CHEST 2V
1 series · 2 of 2 positions shown · non-contrast
Comparison: none

REASON FOR EXAM: SOB
COMMENTS:

[Series 1: pa · 0.17mm/px · 2 of 2 slices shown]
[im 1/2]
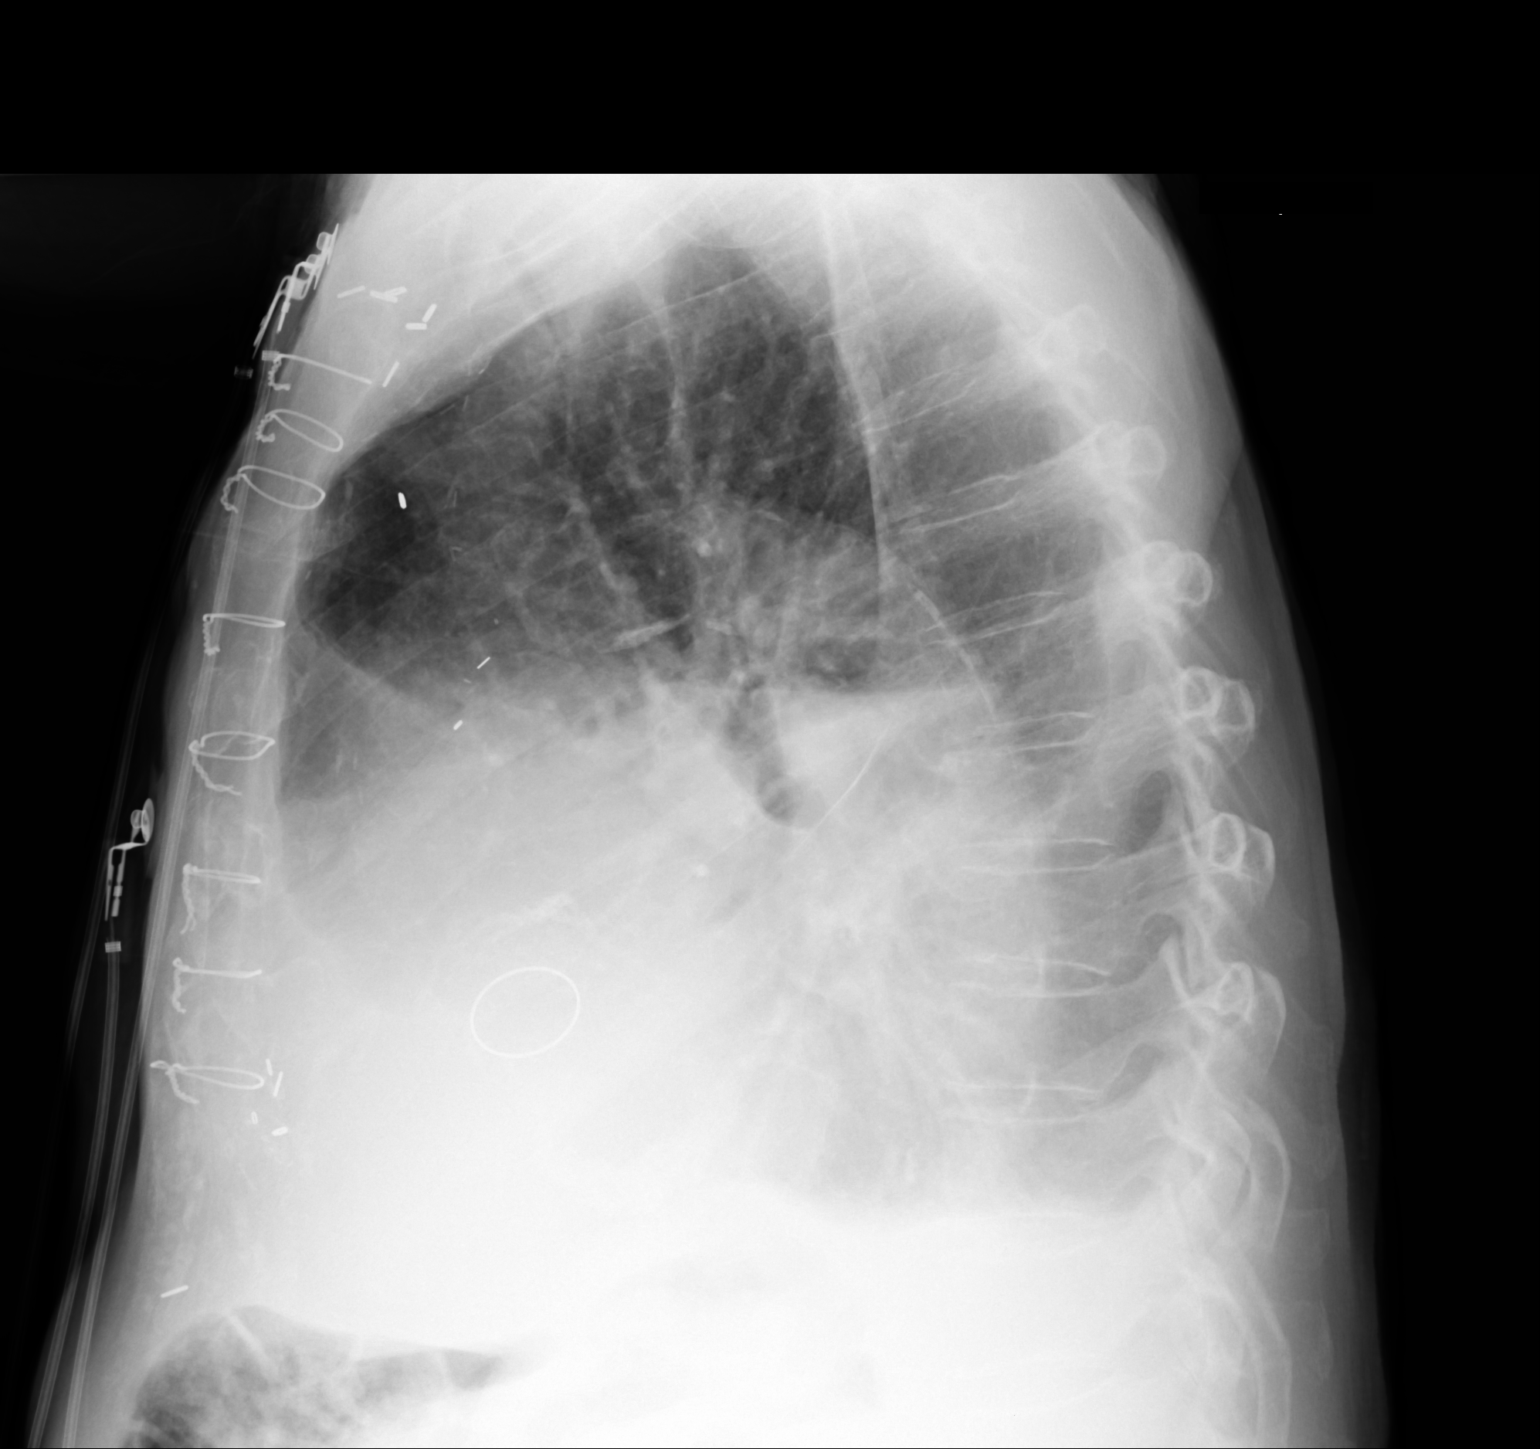
[im 2/2]
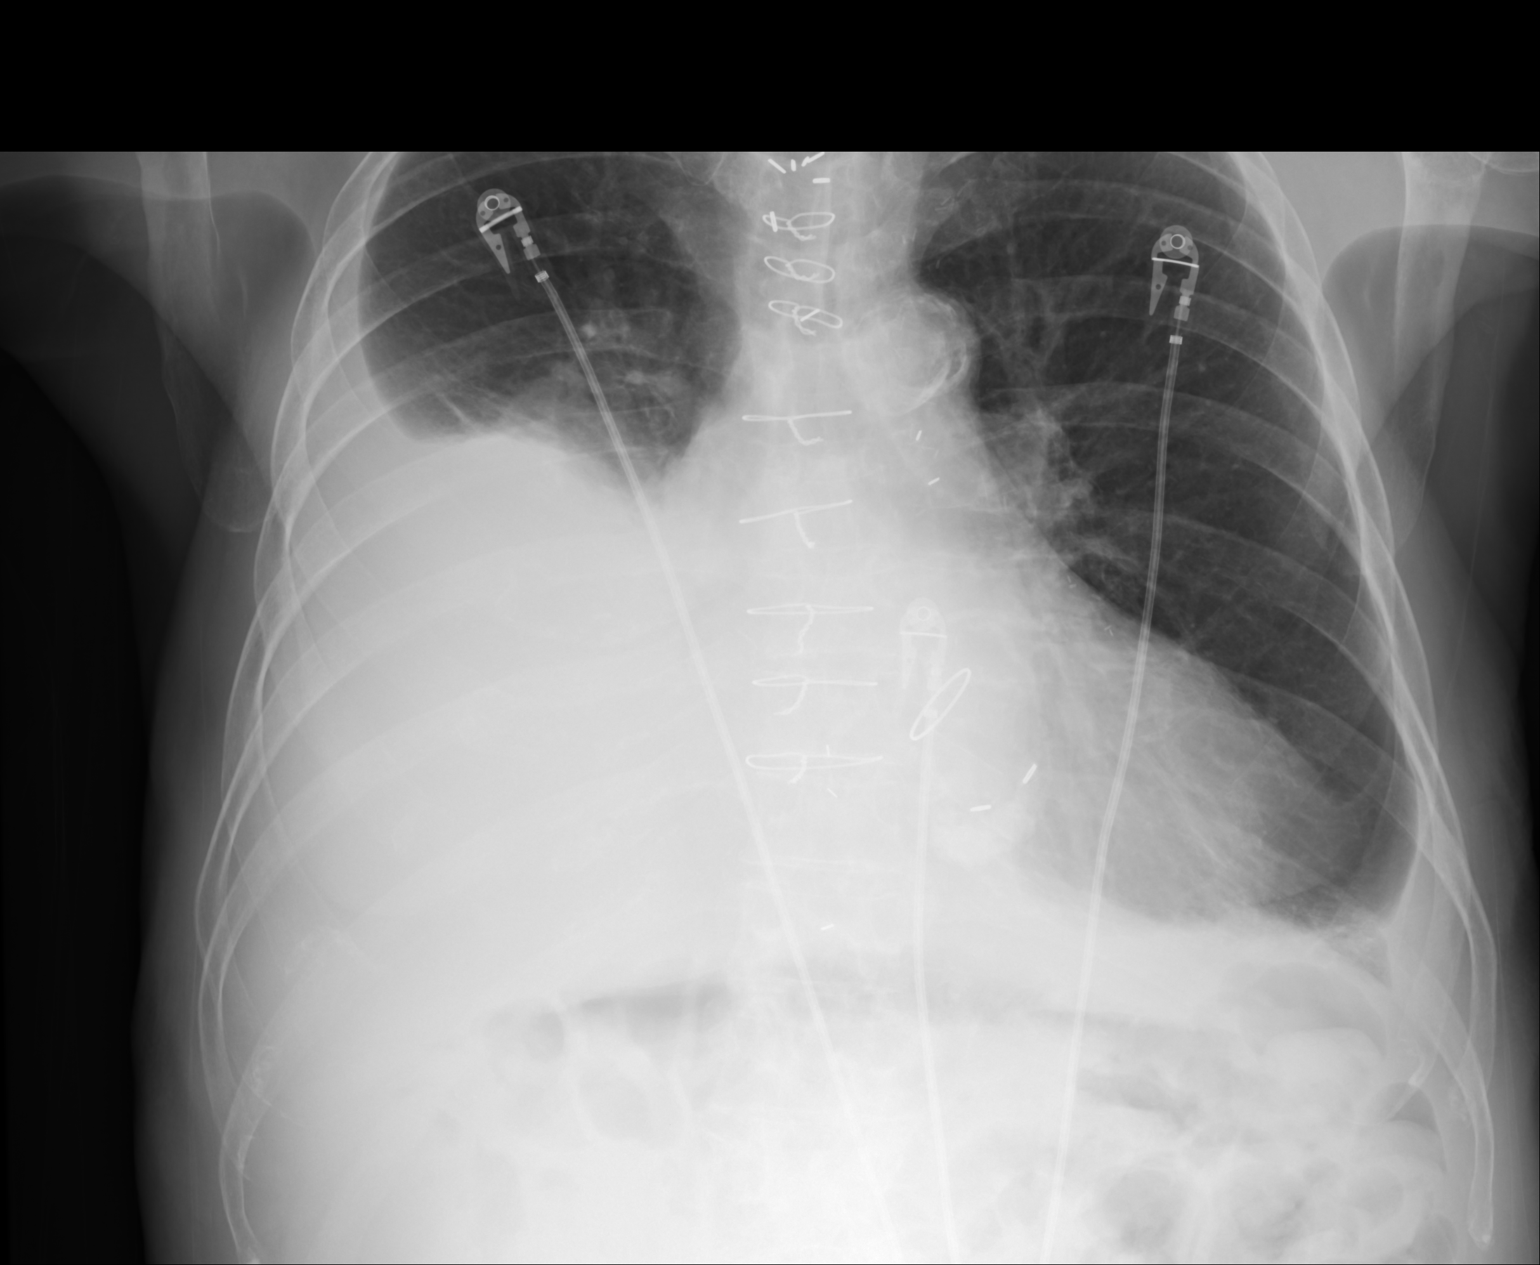

[2 of 2 positions shown; findings below may reference images not displayed]

PROCEDURE:     DXR - DXR CHEST PA (OR AP) AND LATERAL  - April 23, 2013  [DATE]

RESULT:     Comparison is made to the study of 12/07/2012. There and
bilateral pleural effusions. Prosthetic cardiac valve is present. There may
be underlying elevation of the right hemidiaphragm. There is no edema or
pneumothorax. There is no discrete mass.
IMPRESSION: 1. Bilateral pleural effusions. Cardiac surgery changes are present.
Atherosclerotic disease is present. There may be underlying elevation of the
right hemidiaphragm. [REDACTED]

## 2014-11-07 NOTE — Discharge Summary (Signed)
PATIENT NAME:  Jaquelyn BitterHOGEN, Rachit D MR#:  [id number] DATE OF BIRTH:  22-Feb-1918  DATE OF ADMISSION:  04/23/2013 DATE OF DISCHARGE:  04/26/2013  DISCHARGE DIAGNOSES: 1.  Acute congestive heart failure, systolic.  2.  Hypertension.  3.  Diabetes.  4.  Generalized weakness.  5.  History of coronary artery disease.  6.  Acute on chronic renal failure.   CONDITION ON DISCHARGE: Stable.   CODE STATUS: FULL CODE.   MEDICATIONS ON DISCHARGE: 1.  Imdur 30 mg oral extended release once a day.  2.  Cozaar 50 mg take 1/2 tablet once a day.  3.  Multivitamin 1 tablet once a day.  4.  Aspirin 81 mg once a day.  5.  Atenolol 25 mg take 1-1/2 tablets once a day.  6.  TUMS 1 tablet once a day as needed.  7.  Docusate sodium 100 mg 3 tablets once a day. 8.  Glipizide 10 mg oral tablet 2 times a day.  9.  Levothyroxine 25 mcg 1 tablet once a day.  10.  Ranitidine 150 mg oral capsule once a day as needed.  11.  Torsemide 20 mg oral 2 tablets 2 times a day.   HOME HEALTH ON DISCHARGE: Yes.   HOME HEALTH SERVICES: Advised physical therapy.   DIET ON DISCHARGE: Low sodium. Diet supplement:  Ensure; advised 2 times per day.  TIMEFRAME TO FOLLOW UP: With a week with Dr. Mariah MillingGollan and also advised to follow renal function within a week.   HISTORY OF PRESENT ILLNESS: See H and P done by me on 7th October. A 81 year old male with coronary artery disease, CABG and congestive heart failure, was living independent life at [facility] and was gaining weight, almost 15 pounds in the last month, and was advised by Dr. Mariah MillingGollan to take torsemide 40 mg b.i.d., but he was only taking 20 mg once a day because of urinary incontinence and getting trouble due to that. Started having increasing leg swelling and came to the Emergency Room. Started on IV Lasix, had diuresis of almost 4 liters, and his leg swelling disappeared and he was feeling totally fine, came to room air, was able to walk with a walker, and so we planned to  discharge him back to [facility] with some physical therapy. Also advised him to use a urinal at home so he does not have to take trouble of walking to the bathroom too frequently because of torsemide high dose worked. Continued taking high dose torsemide as advised by Dr. Mariah MillingGollan to help him with proper diuresis. He agreed for that.  Other medical issues addressed in this hospital stay:  1.  Acute renal failure. His baseline creatinine was around 1.2, he came with 1.67 creatinine. With diuresis his creatinine came down, up to 1.34 at the time of discharge.  2.  Atrial fibrillation. Rate was controlled with atenolol and we continued that. The patient refused multiple times in the past and this admission also to take any blood thinner medicine including warfarin and so we are discharging without that.  3.  Diabetes. We continued home medication, glipizide, and we gave insulin sliding scale coverage.  4.  Hypertension. We continued Cozaar and atenolol, and he was on Lasix for CHF. 5.  Hypothyroidism. We continued levothyroxine.  CONSULTANTS IN HOSPITAL: Cardiology with Dr. Mariah MillingGollan and Dr. Kirke CorinArida.   IMPORTANT LABORATORY AND DIAGNOSTICS IN THE HOSPITAL: On presentation, BNP was 7725 and creatinine was 1.67. His creatinine came down to 1.34 with therapy  of CHF. White cell count was 5.3, hemoglobin 13, and platelet count 131.   Echocardiogram was done and it showed left ventricular ejection fraction of 55% to 60%, normal global left ventricular systolic function, moderately enlarged right ventricle, moderate reduced right ventricular systolic function, mild aortic valve stenosis, moderate dilated left atrium, moderate tricuspid regurgitation, severely elevated pulmonary artery systolic pressure.   TOTAL TIME SPENT ON THIS DISCHARGE: 40 minutes. ____________________________ Hope Pigeon Elisabeth Pigeon, MD vgv:sb D: 04/26/2013 11:04:24 ET T: 04/26/2013 11:12:58 ET JOB#: [id number]  cc: Hope Pigeon. Elisabeth Pigeon,  MD, <Dictator> Altamese Dilling MD ELECTRONICALLY SIGNED 04/29/2013 23:07

## 2014-11-07 NOTE — H&P (Signed)
PATIENT NAME:  Dylan Perkins, Dylan Perkins MR#:  [id number] DATE OF BIRTH:  1917-12-30  DATE OF ADMISSION:  04/23/2013  PRIMARY CARE PHYSICIAN: Dr. Alphonsus Sias .  REFERRING EMERGENCY ROOM PHYSICIAN: Dr. Daryel November.   PRIMARY CARDIOLOGIST: Dr.  Mariah Milling.  CHIEF COMPLAINT: Leg swelling and shortness of breath.   HISTORY OF PRESENTING ILLNESS: A 81 year old male with a history of CAD status post CABG twice, aortic valve replacement and atrial fibrillation and diastolic heart failure. He is living mostly independent life, walking with a walker, living with his wife in [facility] assisted living facility. For the last few weeks he has been gaining weight and he gained almost 15 pounds in the last almost a month and was feeling increasingly short of breath. Dr. Mariah Milling advised to increase the dose of torsemide 40 mg b.i.d., but he has not been taking it. He takes only 20 mg daily because he is incontinent and getting trouble because of that, so now he is not able to walk properly because of leg swelling, and he has to sleep in a recliner because of severe shortness of breath, so came to Emergency Room today. Dr. Mariah Milling already saw the patient, advised to continue IV Lasix and monitor for CHF, and echocardiogram. Hospitalist service is contacted for further management.   REVIEW OF SYSTEMS:  CONSTITUTIONAL: Negative for fever, fatigue, weakness, pain or weight loss, but he gained 15 pounds in the last month.  EYES: No blurring, double vision, redness or discharge.  EARS, NOSE, THROAT: No tinnitus, ear pain or hearing loss.  RESPIRATORY: No cough, wheezing, but has shortness of breath.  CARDIOVASCULAR: No chest pain, but has severe orthopnea has to sleep in a recliner, and edema on the legs. No arrhythmia or palpitations. No syncopal episode.  GASTROINTESTINAL: No nausea, vomiting, diarrhea or abdominal pain.  GENITOURINARY: No dysuria, hematuria or increased frequency.  ENDOCRINE: No increased sweating. No heat or  cold intolerance.  SKIN: No acne, rashes or lesions.   MUSCULOSKELETAL: No pain or swelling in the joints.  NEUROLOGICAL: No numbness, weakness, dysarthria, tremor or vertigo.  PSYCHIATRIC: No anxiety, insomnia, bipolar disorder. The patient complains that he has a decubitus wound on his buttocks. He is taking Augmentin for the last two weeks, it is healing nicely, but still present.   PAST MEDICAL HISTORY: 1. Coronary artery disease, status post CABG twice.  2. Aortic valve replacement not taking any Coumadin, was taking in the past, but stopped because of recurrent small bleeding.  3. Diastolic heart failure.  4. Severe pulmonary hypertension.  5. aortic valve stenosis.  6. Diabetes.  7. Hypertension.  8. Hyperlipidemia, intolerant to statin.  9. Atrial fibrillation.   PAST SURGICAL HISTORY:  1. Right shoulder replacement surgery.  2. CABG twice.  3. Aortic valve replacement.   ALLERGIES TO MEDICATION:  LISINOPRIL.   SOCIAL HISTORY: He lives in [facility] assisted living facility with his wife and able to walk with a walker, was a smoker long, long time ago and quit since many years. Denies doing any drugs and drinks alcohol socially,  once in awhile.   FAMILY HISTORY: Positive for diabetes.   MEDICATIONS:  1. Tums 500 mg oral tablet once a day.  2. Torsemide 20 mg oral tablet once a day.  3. Ranitidine 150 mg oral tablet once a day.  4. Mylanta oral suspension 10 mL 4 times a day as needed.  5. Multivitamin 1 tablet once a day.  6. Levothyroxine 25 mcg once a day.  7.  Imdur 30 mg oral tablet once a day.  8. Glipizide 10 mg oral tablet 2 times a day.  9. Docusate sodium 100 mg 3 times a day.  10. Cozaar 50 mg 0.5 tablets once a day.  11. Augmentin 1 tablet 2 times a day.  12. Atenolol 25 mg take 1.5 once a day.  13. Aspirin 81 mg once a day.  PHYSICAL EXAMINATION:  VITAL SIGNS: In ER, temperature 97.6, pulse 77, respirations 20, blood pressure 141/70 and pulse oximetry  was 96 on room air.  GENERAL: The patient is fully alert and oriented, appropriate for his age.  HEENT: Head and neck atraumatic. Conjunctivae pink. Oral mucosa moist.  NECK: Supple. No JVD.  RESPIRATORY: Bilateral equal air entry. Few crepitations present.  CARDIOVASCULAR: S1, S2 present. Irregular murmur present.  ABDOMEN: Soft, nontender. Bowel sounds present. No organomegaly.  SKIN: No rashes.  LEGS: Bilateral edema present.  NEUROLOGICAL: Power 5/5. Follows commands. No gross abnormality.  PSYCHIATRIC: Does not appear in any acute psychiatric illness.  IMPORTANT LABORATORY RESULTS: Chest x-ray bilateral pleural effusion and cardiac surgery changes are present. Atherosclerotic disease is present. There may be underlying evidence of right hemidiaphragm.   Glucose 325. BNP 7725, BUN 36, creatinine 1.67, sodium 132, potassium 4.5, chloride 99, CO2 of 30, calcium 8.8. Troponin 0.04. WBC 5.3, hemoglobin 13, platelet count 131, MCV 99.   EKG shows interventricular conduction delay and atrial fibrillation with ventricular rate 73 beats per minute irregular.   ASSESSMENT AND PLAN: A 81 year old male with history of coronary artery disease and status post coronary artery bypass grafting twice and aortic replacement with atrial fibrillation, not on any anticoagulation because of patient preference, hypertension and diabetes came to the Emergency Room with worsening shortness of breath and orthopnea and gain of 15 pounds, found having congestive heart failure exacerbation.  1. Congestive heart failure exacerbation. Possible diastolic as per cardiologist, Dr. Windell HummingbirdGollan's note. We will have to get echocardiogram here as last one was more than a year ago in Dr. Windell HummingbirdGollan's office. We will give IV Lasix 40 mg 3 times a day and we will keep monitoring his weight.  2. Coronary artery disease status post coronary artery bypass graft. We will continue aspirin and atenolol. The patient is intolerant to statin.  3.  Acute renal failure. In baseline the patient's creatinine was 1.2. Today it is 1.67. This might be because of diuretics, but the patient needs it because of congestive heart failure. We will have to continue giving it and we will continue monitoring renal function.  3. Atrial fibrillation. Rate is controlled  and the patient refused to go on warfarin. Repeatedly explained by Dr. Mariah MillingGollan about severe risk.  The patient agrees but does not want to take warfarin or any other anticoagulate.  4. Diabetes. We will continue home medication glipizide and we will give insulin sliding scale coverage.  5. Hypertension. We will continue Cozaar and atenolol, and he is on Lasix for congestive heart failure anyways.  6. Hypothyroidism. Continue levothyroxine.  7. Generalized weakness. We will get physical therapy evaluation.   TOTAL TIME SPENT THIS ADMISSION: 50 minutes.   CODE STATUS:  DO NOT RESUSCITATE. Healthcare power of attorney is his wife confirmed with the patient and his wife. Plan explained to them, they agree.    ____________________________ Hope PigeonVaibhavkumar G. Elisabeth PigeonVachhani, MD vgv:sg D: 04/23/2013 11:46:00 ET T: 04/23/2013 13:57:37 ET JOB#: [id number]  cc: Hope PigeonVaibhavkumar G. Elisabeth PigeonVachhani, MD, <Dictator> Karie Schwalbeichard I. Letvak, MD   Altamese DillingVAIBHAVKUMAR Jerred Zaremba MD ELECTRONICALLY SIGNED  04/29/2013 23:06 

## 2014-11-07 NOTE — Consult Note (Signed)
PATIENT NAME:  Dylan Perkins, Dylan Perkins MR#:  [id number] DATE OF BIRTH:  Apr 23, 1918  DATE OF CONSULTATION:  07/15/2013  REFERRING PHYSICIAN:  Katharina Caper, MD CONSULTING PHYSICIAN:  Sheppard Plumber. Ammi Hutt, MD  REASON FOR CONSULTATION: Management of pleural effusions.   HISTORY OF PRESENT ILLNESS: I have personally seen and examined this patient. I have discussed his care with Dr. Marshia Ly and Dr. Katharina Caper. I have independently reviewed his chart and have independently reviewed the patient's chest x-rays and CT scans.   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 He has undergone a right-sided thoracentesis twice within the past week, and I am being asked to see the patient for management of his pleural effusions. He states that he has been short of breath with minimal activities. However, his biggest complaint is pain in his lower extremities. He states that this is constant and is unbearable and he would like relief of his suffering. It was quite difficult for him to speak in complete sentences as he was short of breath. He states that he did have an aortic valve replacement and coronary bypass surgery done at [facility] approximately 20 years ago. He has a porcine valve in place and has had follow up over the years. He also has severe mitral calcification on his most recent chest CT. He states that he has had little relief with the thoracentesis despite removing several liters of fluid. Throughout the chart the patient has expressed a desire to die peacefully. Before interviewing the patient today I did discuss his care with Dr. Harriett Sine Phifer. She has been seeing the patient for a palliative care consultation and thought that the patient would be a good candidate for hospice. She did not think that any aggressive surgical intervention would be warranted.   PHYSICAL EXAMINATION: He has a very loud systolic murmur. His heart rate is regular.  His lungs show markedly diminished breath sounds on the right and diminished breath sounds on the left. His abdomen is soft and nontender.   DIAGNOSTICS: I have independently reviewed the patient's CT scans and chest x-rays. I see no obvious lung pathology such as lung cancer or other cause for his pleural effusion. I suspect that this is likely related to his heart disease and malnutrition. I did have a long discussion with the patient regarding options for management of his pleural effusion. He was very tired throughout the exam and expressed the desire to be made comfortable. I explained to him that a Pleurx catheter may be an option for him, but he would have to manage that at the facility and it would be a permanent device. He expressed reluctance in undergoing any such procedure. I also asked if he had other family members who may be willing to help [place] make some decisions for him. His wife lives in an independent living facility and he has 3 children who he rarely sees and they do not live in the area.   At the completion of my interview with the patient and in my discussions with him, we did not recommend any surgical intervention at this time. I would say that continued thoracentesis may provide some short-term symptomatic relief. If the patient desires, I would be most happy to participate in a multidisciplinary discussion regarding his short-term management options. At the present time, I did not schedule any surgical interventions or any further follow-up.   Thank you very much for allowing me to participate in  his care today. ____________________________ Sheppard Plumberimothy E. Thelma Bargeaks, MD teo:sb D: 07/15/2013 13:47:56 ET T: 07/15/2013 14:12:24 ET JOB#: [id number]  cc: Marcial Pacasimothy E. Thelma Bargeaks, MD, <Dictator> Jasmine DecemberIMOTHY E Seville Brick MD ELECTRONICALLY SIGNED 07/16/2013 10:59

## 2014-11-07 NOTE — Discharge Summary (Signed)
PATIENT NAME:  Dylan Perkins, Dylan Perkins MR#:  [id number] DATE OF BIRTH:  03/25/1918  DATE OF ADMISSION:  07/04/2013 DATE OF DISCHARGE:  07/09/2013  PRIMARY CARE PHYSICIAN: Tillman Abideichard Letvak, MD  DISCHARGE DIAGNOSES:  1.  Acute on chronic diastolic congestive heart failure. 2.  Right large pleural effusion. 3.  Atrial fibrillation. 4.  Acute renal failure.  5.  Hyponatremia. 6.  Hypotension with history of hypertension. 7.  Hypothyroidism.  8.  Diabetes.   CONDITION: Stable.   CODE STATUS: DNR.   HOME MEDICATIONS: [facility] medication reconciliation list.   HOME HEALTH: The patient needs home health and physical therapy.   DIET: Low sodium, low fat,  ADA diet.  ACTIVITY: As tolerated.   FOLLOWUP CARE: Follow up with PCP within 1 to 2 weeks. Follow up with Dr. Kirke CorinArida within 1 week. Also, the patient needs followup BMP with PCP or Dr. Kirke CorinArida. The patient's losartan, atenolol, and Imdur are on hold due to hypotension. The patient's blood pressure is in normal range, on the low side range. We will hold hypertension medication at this time. May resume later depending on the patient's blood pressure. The patient needs followup with PCP or Dr. Kirke CorinArida for adjustment.   REASON FOR ADMISSION: Not feeling well.  HOSPITAL COURSE: The patient is a 81 year old Caucasian male with a history of multiple medical problems including CHF, CAD, hypertension, diabetes, and right pleural effusion who presented to the ED with complains of not feeling well. He has been intermittently wheezing as well as coughing, especially whenever he moved around. In the ED, chest x-ray showed worsening right side pleural effusion. For detailed history and physical examination, please refer to the admission note dictated by Dr. Winona LegatoVaickute. On admission date laboratory data showed BUN 36, creatinine 1.7, sodium 131, and potassium 3.6. Troponin was 0.11. TSH 54.3. WBC 6.1 and hemoglobin 12.6.  Urinalysis is negative for UTI. EKG showed A-fib with heart rate 70 bpm. Chest x-ray showed cardiomegaly with increasing right pleural effusion, which is large in volume.  1.  Right side large pleural effusion. After admission the patient got ultrasound-guided thoracentesis with removal of 1.3 liters of fluid. Laboratory data of the fluid did not show any infection.  2.  Acute on chronic diastolic dysfunction, CHF. The patient has been treated with Lasix 40 mg IV b.i.Perkins.; however, the patient developed hypotension. In addition, the patient developed hyponatremia so Lasix was discontinued. Dr. Kirke CorinArida evaluated the patient and suggested to resume torsemide 40 mg b.i.Perkins., which is the patient's home medication. The patient's symptoms have much improved. He is off oxygen.  3.  A-fib with RVR. The patient has A-fib, was on atenolol, but atenolol was on hold due to hypotension, but the patient's heart rate has been controlled. The patient refused anticoagulation.  4.  CAD status post CABG. Continue aspirin.  5.  Acute renal failure and dehydration, which is possibly due to Lasix, and hypotension. Renal function has been improving. BUN decreased to 25. Creatinine decreased to 1.24.  6.  Hyponatremia. The patient's sodium has been low. The last sodium is 127 today. According to Dr. Kirke CorinArida, the patient has already started torsemide. The patient needs monitor of sodium level with PCP or Dr. Kirke CorinArida.  7.  Diabetes. The patient has been treated with sliding scale. Since the patient had 1 episode of hypoglycemia with a blood sugar at 54, glipizide was on hold. We will resume glipizide after discharge.  8.  Hypotension. Possibly due to  Lasix. The patient's hypertension medication including losartan, atenolol, and Imdur were on hold. In addition, Lasix was also on hold, but according to Dr. Kirke Corin, we resumed torsemide. The patient will be discharged with torsemide 40 mg b.i.Perkins.  9.  Hypothyroidism. The patient's TSH is greater  than 50. Dr. Winona Legato increased levothyroxine dose to 50 mcg p.o. daily, and we repeated a TSH today, which is 43.6. The patient needs to followup TSH with PCP.  10.  Generalized weakness. The patient underwent physical therapy. Physical therapy suggested the patient needs 24 hours assistance with home health and physical therapy.  The patient has no complaints. Vital signs are stable. He will be discharged to assisted living with home health today. I discussed the patient's discharge plan with the patient, the patient's wife, case Production designer, theatre/television/film, Child psychotherapist, and nurse.   TIME SPENT: About 40 minutes.  ____________________________ Shaune Pollack, MD qc:sb Perkins: 07/09/2013 15:37:22 ET T: 07/09/2013 16:12:42 ET JOB#: [id number]  cc: Shaune Pollack, MD, <Dictator> Shaune Pollack MD ELECTRONICALLY SIGNED 07/09/2013 17:13

## 2014-11-07 NOTE — Consult Note (Signed)
General Aspect Dylan Perkins is a 81yo male w/ a complex PMHx including CAD s/p CABG (redo in 1999), AS s/p porcine bioprosthetic, porcine AVR, moderate residual stenosis, HFpEF, severe pHTN, persistant a-fib (refuses anticoagulation), CKD (stage III),  DM2, HTN, HLD, hypothyroidism and statin intolerance who was admitted to [facility] yesterday with A/C diastolic CHF, chest pain and bilateral pleural effusions.   He was admitted in 04/2013 with decompensated CHF. Echo at the time showed EF 55-60%, severe pulmonary hypertension with moderate tricuspid regurg, moderate mitral valve regurg, moderately dilated left atrium, mild aortic valve stenosis. He was diuresed and followed up with Dr. Mariah MillingGollan 05/2013. Weight had been trending up. Torsemide was adjusted. Goal weight < 140 lbs. R>L pleural effusion was appreciated on exam. The patient refused thoracentesis at that time. He had achieved symptomatic relief from this previously. Since the beginning of this month, his weight has trended up to 150 lbs. He has been seen frequently for a LE cellulitis sourced from a venous stasis ulcer. Torsemide was increased and metolazone added for weight gain and increased LE edema. His has developed progressive fatigue, weakness, SOB/DOE and chest discomfort for which he [facility] ED.   Present Illness There, EKG revealed atrial fibrillation, rate-controlled, downsloping ST depressions V4-V6, I, aVL, diffuse TW blunting. STs unchanged from prior tracings. Initial TnI returned elevated at 0.11. BNP not checked. BUN 36/Cr 1.57 (baseline 1.3-1.5). TSH 54. Na 131. AST mildly elevated. CBC- Hgb 12.6/37.6. U/a unremarkable. CXR- cardiogemaly, large R pleural effusion increasing in volume.   He was admitted by the medicine service. Two subsequent troponins returned mildly elevated at 0.12, 0.10. He was started on Lasix IV with -1350 mL total I/O since admission.   The patient denies any chest pain. He states "I came to the hospital  to take the fluid off my lungs and legs." Reports improved shortness of breath. Persistent LE swelling. Significant UOP since yesterday.   PAST MEDICAL SURGERY Chronic diastolic CHF CAD s/p CABG, redo CABG 1999 AS s/p bioprosthetic AVR Severe pulmonary HTN Persistent atrial fibrillation CKD, stage III DM2 HTN HLD Hypothyroidism  PAST SURGICAL HISTORY: Right shoulder replacement surgery, coronary artery bypass grafting x2, aortic valve replacement.   ALLERGIES: LISINOPRIL. STATINS.  SOCIAL HISTORY: The patient lives in [facility] with his wife and able to walk with a walker. He was smoking in the past, however, quit many years ago. Denies any alcohol or drugs.   FAMILY HISTORY: Positive for diabetes mellitus   Physical Exam:  GEN no acute distress, thin   HEENT pink conjunctivae, PERRL, hearing intact to voice   NECK supple  No masses  trachea midline  JVP 8-9 cm, no bruits   RESP normal resp effort  no use of accessory muscles  diminished R basilar/mid lung sounds, L basilar rales, no wheezes or rhonchi   CARD Irregular rate and rhythm  Normal, S1, S2  II/VI systolic crescendo-decrescendom murmur at RUSB   ABD denies tenderness  soft  normal BS   EXTR negative cyanosis/clubbing, 2+ bilateral LE edema   SKIN normal to palpation, bandage over RLE stasis ulcer, bilateral LE erythema   NEURO follows commands, motor/sensory function intact   PSYCH alert, A+O x 2 to person, place   Review of Systems:  Subjective/Chief Complaint shortness of breath, leg swelling   General: Fatigue  Weakness   Skin: Rashes   Respiratory: Short of breath   Cardiovascular: Dyspnea  Orthopnea  Edema   Review of Systems:  All other systems were reviewed and found to be negative   Home Medications: Medication Instructions Status  Imdur 30 mg oral tablet, extended release 1 tab(s) orally once a day (in the morning) Active  multivitamin 1 tab(s) orally once a day  Active  Aspir 81 81 mg oral tablet 1 tab(s) orally every other day Active  atenolol 25 mg oral tablet 1.5 tab(s) orally once a day Active  Tums 500 mg oral tablet, chewable 1 tab(s) orally once a day, As Needed Active  docusate sodium 100 mg oral tablet 3 tab(s) orally once a day Active  glipiZIDE 10 mg oral tablet 1 tab(s) orally 2 times a day Active  levothyroxine 25 mcg (0.025 mg) oral tablet 1 tab(s) orally once a day Active  ranitidine 150 mg oral capsule 1 cap(s) orally once a day, As Needed Active  torsemide 20 mg oral tablet 2 tab(s) orally 2 times a day Active  losartan 25 mg oral tablet 1 tab(s) orally once a day Active  metolazone 2.5 mg oral tablet 1  tablet  orally 2 times a week Active   Lab Results:  Routine Chem:  18-Dec-14 17:26   BUN  36  Creatinine (comp)  1.57  Sodium, Serum  131  19-Dec-14 01:42   BUN  38  Creatinine (comp)  1.57  Sodium, Serum  133   EKG:  Interpretation atrial fibrillation, downsloping ST depressions V4-V6, I, aVL (unchanged), diffuse TW blunting, isolated PVC, LAD, Q waves V1-V2   Rate 70   EKG Comparision Not changed from  05/2013 tracing   Radiology Results: XRay:    18-Dec-14 18:20, Chest PA and Lateral  Chest PA and Lateral   REASON FOR EXAM:    weak  COMMENTS:       PROCEDURE: DXR - DXR CHEST PA (OR AP) AND LATERAL  - Jul 04 2013  6:20PM     CLINICAL DATA:  Weakness, congestive heart failure    EXAM:  CHEST  2 VIEW    COMPARISON:  DG CHEST 2V dated 12/07/2012    FINDINGS:  Sternotomy wires overlie stable enlarged cardiac silhouette. There  is a moderate to large right pleural effusion which is increased in  volume compared to prior. Small left effusion is noted. There are  valvular calcifications and a valve replacement. There is no  pulmonary edema in the left lung. Right upper lobe is clear. No  aggressive osseous lesion.     IMPRESSION:  Cardiomegaly and increasing right pleural effusion which is large  in  volume.      Electronically Signed    By: Genevive Bi M.D.    On: 07/04/2013 18:22       Verified By: Patriciaann Clan, M.D.,    Lisinopril: Unknown  Vital Signs/Nurse's Notes: **Vital Signs.:   19-Dec-14 04:52  Vital Signs Type Routine  Temperature Temperature (F) 97.9  Celsius 36.6  Temperature Source oral  Pulse Pulse 69  Respirations Respirations 16  Systolic BP Systolic BP 104  Diastolic BP (mmHg) Diastolic BP (mmHg) 60  Mean BP 74  Pulse Ox % Pulse Ox % 94  Pulse Ox Activity Level  At rest  Oxygen Delivery Room Air/ 21 %  *Intake and Output.:   Shift 19-Dec-14 15:00  Grand Totals Intake:   Output:  600    Net:  -600 24 Hr.:  -600  Urine ml     Out:  600  Length of Stay Totals Intake:   Output:  1350  Net:  -1350    Impression 81yo male w/ a complex PMHx outlined above who was admitted for acute on chronic diastolic CHF, expanding R pleural effusion and chest pain.   1. Acute on chronic diastolic CHF Potential contributing factors include recent cellulitis, possible dietary compliance, thyroid imbalance. Hypoalbuminemia from protein malnutrition is certainly playing a large role. Objectively, the patient has evidence of an enlarging R pleural effusion, likely transudative from CHF and affecting his respiratory status. This was noted by Dr. Mariah Milling on an office note from last month. He has diuresed an adequate amount on Lasix IV. To undergo R therapeutic thoracentesis today. Goal weight is < 140 lbs. Weigh 138 lbs today. Baseline weight known to be around 136 lbs.  -- Continue Lasix IV today after thoracentesis while monitoring for re-expansion  -- Nutritionist consult, would benefit from protein supplementation -- Add TED hose -- Check BNP, monitor status of effusion with f/u CXR -- Obtain follow-up 2D echo -- Suspect demand ischemia- risk of aggressive ischemic work-up would outweigh benefit  2. R pleural effusion This has gradually expanding to the  point of respiratory impairment. Suspect transudative d/t CHF. To undergo therapeutic thoracentesis today.  -- Monitor for symptomatic improvement, re-expansion  3. CKD, stage III Cr 1.57 yesterday and today. Near baseline. Stable on ARB.  -- Continue to monitor with diuresis.   4. Hypothyroidism TSH 54 -- Synthroid adjustment per primary team  5. Elevated troponin/CAD s/p CABG Suspect underlying CHF and CKD accounting for this. The patient actually denies chest pain. EKG reveals no new ischemic changes. Risk of aggressive ischemic eval outweighs benefit in this 81yo gentleman with multiple chronic comorbidities.  -- Continue ASA 81, BB, ARB, Imdur. Statin intolerant.   Plan 6. Persistent atrial fibrillation Rate-controlled on metoprolol. Has refused anticoagulation.  -- Continue to monitor post-thoracentesis.   Dispo- Given the patient's chronic comorbidities, malnutrition, frailty, weakness and depression, he would benefit from a higher level of care on discharge. Will need to evaluate options for ALF/SNF. Will look into current outpatient living situation/care. Hospice has been discussed previously for comfort. Readdress this admission. Social work consult may be of benefit.   Electronic Signatures for Addendum Section:  Lorine Bears (MD) (Signed Addendum 19-Dec-14 11:55)  The patient was seen and examined. Agree with the above. He presetned with worsening dyspnea, weight gain and fluid overload. He had theraputic throacentesis. Continue gentle diuresis and medical therapy for CAD. No plans for ischemic work up. Consider palliative care consult due to age and multiple comorbidities.   Electronic Signatures: Odella Aquas A (PA-C)  (Signed 19-Dec-14 10:20)  Authored: General Aspect/Present Illness, History and Physical Exam, Review of System, Home Medications, Labs, EKG , Radiology, Allergies, Vital Signs/Nurse's Notes, Impression/Plan Lorine Bears (MD)  (Signed 19-Dec-14  11:55)  Co-Signer: General Aspect/Present Illness, Home Medications, Labs, Radiology, Allergies, Vital Signs/Nurse's Notes, Impression/Plan   Last Updated: 19-Dec-14 11:55 by Lorine Bears (MD)

## 2014-11-07 NOTE — Consult Note (Signed)
General Aspect 81 yo WM with history of DM, HTN, hyperlipidemia, CAD s/p CABG and redo, s/p porcine AVR with moderate residual stenosis, chronic diastolic CHF , chronic atrial fibrillation  not on coumadin therapy,  severe pulmonary HTN,  intolerance of statins, who presents to the ER for worsening leg edema, shortness of breath and malaise.   Goal weight is 135 to 139 pounds or less. he was seen in the clinic yesterday and weight was 153 pounds. He is sleeping in a recliner as he is SOB when supine. He is unable to tolerate torsemide 40 mg po BID (takes only one a day) as he has incontinence.  He is frustrated as shortness of breath has been getting worse, now with abdominal swelling, worsening leg edema. He has nausea/anorexia. He sleeps in a recliner as he is able to breathe better.  He is having difficulty walking secondary to his leg edema. He reports sleep has been even worse, worsening scrotal edema. He is worried about a sore on his bottom which is slowly healing. Wife is helping to take care of the sore.  H/o clot to his right eye " that has caused significant vision loss on the right.    In the past he has refused restarting warfarin.  He is aware of the risk and benefits. Aware that he could have stroke with atrial fibrillation.  He had a  echocardiogram for worsening edema on 07/28/2011 Echocardiogram showed severe pulmonary hypertension, moderate aortic valve stenosis of his bioprosthetic valve, mildly dilated left atrium  EKG shows atrial fibrillation with ventricular rate 89  beats per minute, intraventricular conduction delay, old anteroseptal infarct, left axis deviation/LAFB   Present Illness . Outpatient Encounter Prescriptions as of 04/22/2013   ???  alum & mag hydroxide-simeth (MYLANTA) 200-200-20 MG/5ML suspension  Take by mouth as needed.     ???  amoxicillin-clavulanate (AUGMENTIN) 875-125 MG per tablet  Take 1 tablet by mouth 2 (two) times daily.   ???  aspirin EC 81 MG  tablet  Take 81 mg by mouth every other day.          ???  atenolol (TENORMIN) 25 MG tablet  Take 1.5 tablets (37.5 mg total) by mouth daily.   45 tablet   6   ???  calcium carbonate (TUMS) 500 MG chewable tablet  Chew 1 tablet by mouth daily as needed.          ???  docusate sodium (COLACE) 100 MG capsule  Take 300 mg by mouth daily.          ???  glipiZIDE (GLUCOTROL) 10 MG tablet  TAKE 1 TABLET TWICE DAILY   60 tablet   9   ???  Glucose Blood (BAYER BREEZE 2 TEST) DISK  TEST UP TO 4 TIMES PER DAY, dx: 250.00   100 each  ???  isosorbide mononitrate (IMDUR) 30 MG 24 hr tablet  Take 1 tablet (30 mg total) by mouth daily.   90 tablet  ???  levothyroxine (SYNTHROID, LEVOTHROID) 25 MCG tablet  TAKE 1 TABLET EVERY DAY   90 tablet   3   ???  losartan (COZAAR) 25 MG tablet  TAKE 1 TABLET (25 MG TOTAL) BY MOUTH DAILY.   90 tablet   4   ???  Multiple Vitamin (MULTIVITAMIN) tablet  Take 1 tablet by mouth daily after breakfast.           ???  ranitidine (ZANTAC) 150 MG capsule  Take 150 mg by mouth daily  as needed.          ???  torsemide (DEMADEX) 40 MG tablet  Take BID (he is only taking daily, possibly only a 20 mg pill))   Physical Exam:  GEN well developed, well nourished, no acute distress   HEENT hearing intact to voice, moist oral mucosa   NECK supple    RESP normal resp effort  dull at the bases    CARD Irregular rate and rhythm  Murmur    Murmur Systolic    Systolic Murmur Out flow    ABD denies tenderness  soft    LYMPH negative axillae   EXTR positive edema, 2+ pitting woody edema   SKIN tight legs   NEURO motor/sensory function intact   PSYCH alert, A+O to time, place, person, good insight   Review of Systems:  Subjective/Chief Complaint SOB, leg swelling, ABD swelling, anorexia, malaise   General: Fatigue  Weakness    Skin: No Complaints    ENT: No Complaints    Eyes: No Complaints    Neck: No Complaints    Respiratory: Short of breath    Cardiovascular:  Dyspnea    Gastrointestinal: No Complaints    Genitourinary: No Complaints    Vascular: No Complaints    Musculoskeletal: No Complaints    Neurologic: No Complaints    Hematologic: No Complaints    Endocrine: No Complaints    Psychiatric: No Complaints    Review of Systems: All other systems were reviewed and found to be negative    Medications/Allergies Reviewed Medications/Allergies reviewed        atrial fib:    Diverticulitis:    Arthritis:    Diabetes:    CAD:    Cataracts:    Right shoulder surgery:    Aortic valve replacement:    Triple bypass:    Prostatectomy:   EKG:  Interpretation EKG shows atrial fibrillation with rate 80s to i90s    Lisinopril: Unknown   Impression 81 yo WM with history of DM, HTN, hyperlipidemia, CAD s/p CABG and redo, s/p porcine AVR with moderate residual stenosis, chronic diastolic CHF , chronic atrial fibrillation not on coumadin therapy,  severe pulmonary HTN,  intolerance of statins, who presents to the ER for worsening leg edema, shortness of breath and malaise.   1) Acute on chronic diastolic CHF Unable to tolerate higher dose torsemide at home, weight as of yersterday up 15 pounds from baseline He has worsening leg edema, ABD swelling, increasing SOB yesterday in clinic, sleeping in a recliner. He states only taking torsemide 20 mg daily (not torsemide 40 mg po BID as recommended on multiple office visits). He has problems with incontinence. ---Suggest he start with lasix 40 mg IV Q8. If minimal diuresis, could increase to 80 mg IV. --Labs pending. Will closely follow BMP (replete potassium and mg as needed) Goal would be for >10 pound weight loss. CHF education for he and his wife.  2) Prosthetic aortic valve Echo ordered for evaluation. Last 07/2011. Residual stenosis on prior echo  3) Chronic atrial fibrillation Rate reasonably well controlled. on atenolol. He has indicated on previous office visits and  again today that he does not want warfarin He is aware of the CVA risk  4) Severe pulmonary HTN Seen on previous echo 07/2011. Repeat pending  5) CAD, s/p cabg he denies anginal sx. repeat echo to evaluate EF. If EF depressed, may need ischemia workup   Electronic Signatures: Julien Nordmann (MD)  (Signed 07-Oct-14 09:53)  Authored: General Aspect/Present Illness, History and Physical Exam, Review of System, Past Medical History, Home Medications, EKG , Allergies, Impression/Plan   Last Updated: 07-Oct-14 09:53 by Julien Nordmann (MD)

## 2014-11-07 NOTE — Discharge Summary (Signed)
PATIENT NAME:  Dylan Perkins, Dylan Perkins MR#:  [id number] DATE OF BIRTH:  [birth year]  DATE OF ADMISSION:  07/10/2013 DATE OF DISCHARGE:  07/12/2013   PRIMARY CARE PHYSICIAN: Karie Schwalbeichard I. Letvak, MD  CONSULTATIONS: Ned GraceNancy Phifer, MD  PROCEDURE: Thoracentesis with withdrawal of 1.5 liters fluid.   FINAL DIAGNOSES:  1. Right large pleural effusion.  2. Atrial fibrillation with rapid ventricular response.  3. Acute renal failure with dehydration and hyponatremia.  4. Acute encephalopathy. 5. Coronary artery disease. 6. Diabetes.  7. Hypertension,   CONDITION: Stable.   CODE STATUS: DNR.   HOME MEDICATIONS: Please refer to the [facility] physician discharge instruction medication reconciliation list.   DIET: Low sodium, ADA diet.   ACTIVITY: As tolerated.   FOLLOWUP CARE: Follow up with PCP within 1 to 2 weeks. Follow up with Dr. Kirke CorinArida within 1 to 2 weeks. The patient also needs home care for tears of the skin and fall precautions.   REASON FOR ADMISSION: Fall.   HOSPITAL COURSE: The patient is a 81 year old Caucasian male with a history of hypertension, diabetes, CHF, pulmonary hypertension, pleural effusion, who was sent from nursing home due to fall. Actually, the patient was just discharged 1 day before this admission after diagnosis of pleural effusion with thoracentesis. The patient was sent to nursing home, but fell in the morning and was sent back due to confusion and skin tears on head and extremities. For detailed history and physical examination, please refer to the admission note dictated by me. The patient's O2 saturation decreased to 80s to 90s in ED. Chest x-ray showed right large pleural effusion. Laboratory data showed urinalysis negative. Pelvic x-ray negative. CAT scan of head: Scalp injury without intracranial hemorrhage. Cervical CT scan did not show any fracture or dislocation. BNP 9222. Hemoglobin 12.3, WBC 4.6. BUN 30, creatinine 1.43. Troponin 0.07.  1. Bilateral pleural effusions  with right-sided large pleural effusion. After admission, the patient got a thoracentesis with 1.5 liters of fluid withdrawn. After the procedure, the patient has no complication of pneumothorax. The patient's symptoms have much improved. Lung sounds are clear.  2. Acute encephalopathy, possibly due to acute on chronic congestive heart failure with pleural effusion. The patient's mental status has been improving.  3. Acute on chronic congestive heart failure, diastolic dysfunction. The patient has been treated with torsemide 40 mg b.i.Perkins.  4. Acute renal failure with dehydration, is possibly due to torsemide. The patient needs to monitor BMP as outpatient.  5. Diabetes, has been treated with sliding scale.  6. The patient underwent physical therapy evaluation. The patient's vital signs are stable. He is clinically stable and will be discharged to skilled nursing facility today.  7. In addition, the patient got wound care for skin tears and got stitches on head/scalp tear.   I discussed the patient's discharge plan with the patient, the patient's wife, Dr. Harvie JuniorPhifer, case manager, Dealers[employer] worker and nurse.   TIME SPENT: About 36 minutes.   ____________________________ Shaune PollackQing Hannan Tetzlaff, MD qc:lb Perkins: 07/12/2013 11:32:49 ET T: 07/12/2013 11:51:10 ET JOB#: [id number]  cc: Shaune PollackQing Virgina Deakins, MD, <Dictator> Shaune PollackQING Kalliope Riesen MD ELECTRONICALLY SIGNED 07/12/2013 13:30

## 2014-11-07 NOTE — H&P (Signed)
PATIENT NAME:  Dylan Perkins, Dylan Perkins MR#:  [id number] DATE OF BIRTH:  [birth year]  DATE OF ADMISSION:  07/10/2013  PRIMARY CARE PHYSICIAN:  Karie Schwalbeichard I. Letvak, MD  CHIEF COMPLAINT: Fall this morning.   HISTORY OF PRESENT ILLNESS: This is a 81 year old Caucasian male with a history of hypertension, diabetes, CHF, pulmonary hypertension, pleural effusion was sent from nursing home due to fall this morning. Actually, patient was just discharged to a skilled nursing facility yesterday after treatment of pleural effusion and CHF.  The patient got thoracentesis during last admission, withdrew 1.3 liters fluid.  The lab did not show any evidence of infection. The patient was sent to a nursing home yesterday after receiving torsemide. The patient is confused, unable to provide any detailed information. According to Dr. Margarita GrizzleWoodruff, ED physician, and the patient's wife, the patient fell by accident and this morning when he tried to get out of bed. Patient wife said there are no rails on the bed, so the patient fell and injured his head and extremities with skin tears and bleeding. The patient got stitches by Dr. Margarita GrizzleWoodruff, stitches on the head skin by Dr. Margarita GrizzleWoodruff. According to Dr. Margarita GrizzleWoodruff, the patient's O2 saturation decreased to 80 to 90s. The patient is being treated with oxygen by nasal cannula. In addition, chest x-ray showed right-sided large pleural effusion so the patient is being admitted for fall and pleural effusion.   PAST MEDICAL HISTORY:  1.  CAD, status post CABG twice.  2.  Aortic valve replacement.  3.  Diastolic heart failure.  4.  Severe pulmonary hypertension.  5.  Aortic valve stenosis.  6.  Hypertension.  7.  Diabetes.  8.  Hyperlipidemia.  9.  A. fib.    PAST SURGICAL HISTORY: CABG twice, right shoulder replacement, aortic valve replacement.   SOCIAL HISTORY: No smoking or drinking or illicit drugs. [facility] Was a smoker a long time ago.  No alcohol drinking or illicit drugs.   FAMILY HISTORY: Diabetes.   REVIEW OF SYSTEMS: Unable to obtain due to the patient's confusion status.   HOME MEDICATIONS:  1.  Tums 500 mg p.o. once a day p.r.n. 2. Torsemide 20 mg p.o. tablets 2 tablets b.i.Perkins. 3. Ranitidine 150 mg p.o. once a day p.r.n. 4. Multivitamin 1 tablet once a day.  5.  Levothyroxine 50 mcg p.o. daily.  6.  Glipizide 10 mg p.o. b.i.Perkins. 7. Colace 100 mg p.o. 3 tablets once a day.   8.  Aspirin 81 mg p.o. daily.   ALLERGIES: LISINOPRIL.   PHYSICAL EXAMINATION: VITAL SIGNS: Temperature 98.1, blood pressure 112/74, pulse 75, O2 saturation 100% on  oxygen 2 liters by nasal cannula.  GENERAL: The patient is awake but confused, in no acute distress.  HEENT: Pupils round, equal and reactive to light and accommodation. Dry oral mucosa. Clear oropharynx.  NECK: Supple. No JVD or carotid bruit. No lymphadenopathy. No thyromegaly.  CARDIOVASCULAR: S1, S2 regular rate, rhythm. No murmurs, gallop.  PULMONARY: Bilateral air entry. No rales or crackles. No use of accessory muscle to breathe but has very weak breath sounds on the right side.  ABDOMEN: Soft. No distention. No tenderness. No organomegaly. Bowel sounds present.  EXTREMITIES: No edema, clubbing or cyanosis. No calf tenderness. Bilateral pedal pulses present but there are multiple skin tears with bleeding and bruises on the extremities.    SKIN: No rash or jaundice, but has bruises on the skin care with bleeding extremities. There are also  skin tears on the head with stitches.  NEUROLOGIC: The patient is confused, unable to examine, only follows limited commands.   LABORATORY, DIAGNOSTIC AND RADIOLOGICAL DATA:  1.  Urinalysis is negative.   2.  Pelvis x-ray: No fracture or dislocation.  3.  CAT scan of head without contrast: Scalp injury over left paracentral frontal region without underlying fracture or intracranial hemorrhage.   4.  Cervical CT scan showed cervical  spondylotic changes with various degrees of the spinal stenosis and foraminal narrowing.  5.  CAT scan of the chest showed large right pleural effusion with small left pleural effusion, no PE.  6.  BNP 9222.  7.  WBC 4.6, hemoglobin 12.3, platelets 159.  8.  BUN 30, creatinine 1.43, sodium 128, potassium 3.7, chloride 88, bicarb of 37, alkaline phosphatase 259, SGPT 52, SGOT 68, albumin 2.7. Anion gap 3.  9.  Troponin 0.07.  10.  INR 1.1.  11.  EKG shows A. fib at 93 bpm.   IMPRESSIONS: 1.  Bilateral pleural effusions with a large pleural effusion on the right side.  2.  Acute on chronic congestive heart failure, diastolic dysfunction.  3.  Acute encephalopathy.  4.  Fall.  5.  Head injury.  6.  Skin tears.  7.  Acute renal failure, dehydration.  8.  Hyponatremia.    9.  Coronary artery disease.  10.  Atrial fibrillation.   11.  Hypertension.  12.  Diabetes.   PLAN OF TREATMENT: 1.  The patient will be admitted to telemetry floor. We will continue O2 by nasal cannula, continue torsemide and follow up BMP and cardiology consult from Dr. Kirke Corin.  2.  We will request a radiologist to get an ultrasound-guided thoracentesis for right large pleural effusion.  3.  For diabetes, we will start sliding scale.  4.  For hypertension, the patient's blood pressure is on the low side. We will continue torsemide.  6.  We will start fall and aspiration precautions, get a physical therapy evaluation and wound care consult.   I discussed the patient's condition and plan of treatment with the patient's wife. The patient's CODE STATUS is DO NOT RESUSCITATE.   TIME SPENT: About 66 minutes.    ____________________________ Shaune Pollack, MD qc:cs Perkins: 07/10/2013 14:18:18 ET T: 07/10/2013 15:10:05 ET JOB#: [id number]  cc: Shaune Pollack, MD, <Dictator> Shaune Pollack MD ELECTRONICALLY SIGNED 07/10/2013 16:58

## 2014-11-08 NOTE — Discharge Summary (Signed)
PATIENT NAME:  Dylan Perkins, Ted D MR#:  [id number] DATE OF BIRTH:  04/22/1918  ADDENDUM  DATE OF ADMISSION:  07/10/2013 DATE OF DISCHARGE:  07/15/2013  The patient was about to be discharged on the 26th of December 2014;  however, he developed atrial fibrillation, rapid ventricular response with heart rate as high as 140s. He was initiated on metoprolol as well as digoxin was added because of his poor heart rate control.  With this, his condition somewhat improved. His heart rate now is ranging between 50s to 110s intermittently. We are not able to advance his beta blockers or  calcium channel blockers because of his relative hypotesnion  and need for diuretics.   In regards to right pleural effusion as mentioned above, the patient had thoracentesis done on 26th of December 2014, it removed approximately 1.5 liters of fluid; however,  the patient's pleural space was not dried out. So, repeated chest x-ray done on the 27th of December 2014 PA and lateral revealed worsening right pleural effusion. It was unclear if that is just a small accumulation from before or is it actually significant worsening and fluid retention in the pleural space. This was discussed with Dr. Thelma Bargeaks, who felt that the patient would be candidate for PleurX catheter placement; however,  the patient himself did not want to have that done. He is very reluctant to have any procedures done; however, he said that he may change his mind if the shortness of breath is significant and not alleviated with conservative therapy alone. We discussed the patient's case with Dr. Harvie JuniorPhifer, palliative care physician, who felt that the patient would benefit from discharging the patient back to skilled nursing facility with hospice care. The patient is being discharged to skilled nursing facility today on the 29th of  December 2014. He still is relatively stable. His vital signs are temperature of 98.5, pulse was from 50s to 110, respiratory rate  was 16 to 18,  blood pressure ranging from 99 systolic to 109 systolic and 60s to 70s diastolic, oxygen saturation is 94% to 96% on 2 liters of oxygen through nasal cannula.   The patient is to continue diuretics with high doses of torsemide at 40 mg twice daily dose and have his urine output followed, ins and outs followed to make sure that his fluid balance is  negative for this therapy. However,  we realize that the patient's condition overall is worsening, and his congestive heart failure is still quite poorly controlled.  We feel the patient would benefit from hospice care, and ultimately if his shortness of breath is so significant, it may need to be controlled with PleurX catheter placement in the right chest to alleviate fluid collection in pleural space.   LABORATORIES: BMP done on 28th December 2014 revealed BUN and creatinine of 33 and 1161, sodium 132, bicarbonate level of 37. Liver enzymes done the same day, 28th of December 2014; total protein of 6.2, albumin about 2.2, alkaline phosphatase 206, AST 61. Digoxin level was checked on the 27th of December 2014 was 0.84. The patient's white blood cell count was not checked recently. Hemoglobin is 11.1 and platelet count 139. Again, the patient's aspirin  as well as Plavix will be placed on hold due to possibility of PleurX catheter placement as outpatient by Dr. Thelma Bargeaks. However, if PleurX catheter is not anticipated, aspirin as well as Plavix should be restarted.   TIME SPENT: 40 minutes on this patient.   ____________________________ Katharina Caperima Fiora Weill, MD rv:sg D: 07/15/2013 10:27:31  ET T: 07/15/2013 11:34:40 ET JOB#: [id number]  cc: Katharina Caper, MD, <Dictator> Kanyon Bunn MD ELECTRONICALLY SIGNED 07/29/2013 14:52

## 2014-11-08 NOTE — H&P (Signed)
PATIENT NAME:  Dylan Perkins, Dylan Perkins MR#:  [id number] DATE OF BIRTH:  01-20-1918  DATE OF ADMISSION:  07/04/2013  PRIMARY CARE PHYSICIAN: Dr. Alphonsus Sias.   HISTORY OF PRESENT ILLNESS: The patient is a 81 year old Caucasian male, with past medical history significant for history of multiple medical problems including systolic CHF, history of coronary artery disease, hypertension, diabetes mellitus, generalized weakness, right pleural effusion, who presents to the hospital with complaints of not feeling well.   According to the patient, he has been going downgrade over the past 1 year. He admits of having lower extremity swelling as well as having increasing weakness, feeling dragged out. He admits of having some chills as well as fatigue, weakness. He tries to keep his weight around 145. He admits to be having some left-sided chest pains. The pain is described as achiness in the sternal area to the neck area, increasing with exertion, as well as accompanied with significant shortness of breath. He has also been intermittently wheezy as well as coughing, especially whenever he moves around. He admits of having some phlegm production. Because he did not feel well, he decided to come to Emergency Room for further evaluation.   In the Emergency Room, his EKG looks about the same as in October 2014; however, his chest x-ray shows worsening right-sided pleural effusion which is described by the radiologist as large.   Hospitalist services was contacted for admission.   PAST MEDICAL HISTORY: Significant for history of diastolic CHF. Echocardiogram done recently reportedly ejection fraction was 55% to 60% with moderate enlargement of right ventricle, moderate decrease of right ventricular systolic function. Also marked moderate aortic valvular stenosis, moderate dilatation of the left atrium, moderate tricuspid regurgitation, as well as severe pulmonary hypertension. The patient has also aortic valve replacement with  moderate stenosis with porcine valve, a history of chronic atrial fibrillation, not on Coumadin therapy at present. INTOLERANCE TO STATINS. History of hypertension, diabetes mellitus, generalized weakness, coronary artery disease status post coronary artery bypass grafting as well as redo, acute-on-chronic renal failure, long-time past smoker, quit tobacco 50 years ago in 1980s, history of hyperlipidemia, history of DVT and left superficial femoral artery in July 2012.    MEDICATIONS: According to medical records, the patient is on aspirin 81 mg p.o. daily, atenolol 25 mg one and a half tablets once daily, Cozaar 35 mg p.o. daily, docusate 100 mg three pills once daily, glipizide 10 mg p.o. twice daily, Imdur 30 mg p.o. daily, levothyroxine 25 mcg p.o. daily, multivitamins once daily, ranitidine 150 mg p.o. daily as needed, torsemide (Dictation Anomaly) mg two tablets twice daily, and TUMS 500 mg p.o. daily as needed.   PAST SURGICAL HISTORY: Right shoulder replacement surgery, coronary artery bypass grafting x2, aortic valve replacement.   ALLERGIES: LISINOPRIL.     SOCIAL HISTORY: The patient lives in [facility] with his wife and able to walk with a walker. He was smoking in the past, however, quit many years ago. Denies any alcohol or drugs.   FAMILY HISTORY: Positive for diabetes mellitus   REVIEW OF SYSTEMS: Positive for fatigue and weakness. Some chills. No troubles with weight. He tries to maintain 145 pounds weight. Admits of having some wheezing as well as cough, as well as some phlegm production. Denies any sinus congestion, postnasal drip, eye troubles. Admits, however, of right eye blindness. On the left eye has some difficulty seeing as well. Currently he thinks some kind of vein issues, but not stroke, which affected his  right eye. Also admits of having some left-sided chest pains. Admits to having significant constipation. Last bowel movement was, however,  yesterday. He does not empty his bladder appropriately. Admits of having some skin dryness and frequent bruising. Denies any fevers. Admits of fatigue and weakness. Denies any pains except as mentioned above. Denies nystagmus, blurry vision, double vision, glaucoma or cataracts.  ENT: Denies any tinnitus, allergies, epistaxis, sinus pain, dentures, difficulty swallowing , hemoptysis. Admits of shortness of breath.  CARDIOVASCULAR: Denies any arrhythmias, palpitations or syncope.  GASTROINTESTINAL: Denies any nausea, diarrhea, or constipation.  GENITOURINARY: Denies dysuria, hematuria, frequency, incontinence.  ENDOCRINE: Denies polyuria , nocturia, thyroid problems, heat or cold intolerance. HEMATOLOGIC: Denies anemia, easy bruising, bleeding, or swollen glands.   SKIN: Denies acne, rashes, lesions, or moles. MUSCULOSKELETAL: Denies arthritis, cramps, swelling.  NEUROLOGIC: Denies numbness or tremors.  PSYCHIATRIC: Denies anxiety, insomnia.   PHYSICAL EXAMINATION:  VITAL SIGNS: On arrival to the hospital, temperature was 97.6, pulse was 69. Respiratory rate was 18, blood pressure 143/60, saturation was 95% on room air.  GENERAL: This is a well-developed, thin, very pale and frail-looking Caucasian male sitting on the stretcher.  HEENT: Pupils equal. Has reactive to light  .  Normal hearing. No pharyngeal erythema. Mucosa is dry.  NECK: No masses. Supple, nontender. Thyroid is not enlarged, no adenopathy. No JVD or carotid bruits, full range of motion.  LUNGS: Clear to auscultation on the left, however,  significantly decreased breath sounds as well as dullness to percussion on the right. Increased respiratory effort, somewhat asymmetrical rise and fall. Asymmetrical expansion.  CARDIOVASCULAR: S1, S2 appreciated. No murmurs, rubs, or gallops are present. Rythm was irregularluy irregular . Chest is nontender to palpation with good peripheral pulses.  EXTREMITIES: Normal to palpation. The patient  did have lower extremity edema, more on the left side, approximately 1+ lower extremity edema but no calf tenderness or cyanosis was noted.  ABDOMEN: Soft, nontender. Bowel sounds. No hepatosplenomegaly or masses were noted.  RECTAL: Deferred.  MUSCLE STRENGTH: Able to move all extremities. No cyanosis, degenerative joint disease. The patient does have kyphosis. Gait is not tested.  SKIN: Did not reveal any rashes or lesions. The patient has multiple bruises as well as healed excoriations and erosions around his knee area.  NEUROLOGIC: Gait not tested. The patient is able to move extremities bilaterally. No tenderness or effusions were noted. Range of motion is adequate. Strength and tone equal bilaterally and stable.  Cranial nerves grossly intact. Sensory is intact. No dysarthria or aphasia. The patient is alert and oriented to time, person and place. Cooperative. Memory is good. No significant confusion, agitation or depression noted.  LYMPH: No cervical lymph nodes noted. No lymphadenopathy noted. Good peripheral pulses bilaterally.   LABORATORY AND RADIOLOGICAL DATA: BMP revealed BUN and creatinine 36 and 1.57. Sodium level 131, potassium 3.6. Estimated GFR for African American would be 43 and for non-African American would be 37. The patient's glucose level is elevated to 167. Albumin level was 2.5, alkaline phosphatase 168, AST elevation to 45. Troponin slightly elevated 0.11. CK total was 49. TSH was high at 54.3. White blood cell count is normal at 6.1, hemoglobin is 12.6, platelet count is 193. Absolute neutrophil count is normal at 4.9.   URINALYSIS: Straw clear urine, negative for glucose, bilirubin or ketones. Specific gravity 1.006, and pH was 7.0, 1+ blood, negative for protein, nitrites or leukocyte esterase, 1 red blood cell, no white blood cells or bacteria. No epithelial cells, but mucus  was present as well as 12 hyaline casts.   EKG showed AFib at a rate of 70 beats per minute, left  axis deviation, nonspecific intraventricular block, PVCs, aberrantly conducted complexes, cannot rule out septal infarct with QS in V1, V2, age indeterminate, T wave abnormalities, depressions in the anterolateral leads, which are chronic. No significant change since prior EKG which was done in October 2014. Chest x-ray, PA and lateral, the 18th of December 2014, revealed cardiomegaly as well as increasing right pleural effusion which is large in volume.   ASSESSMENT AND PLAN:  1.  Admit to the medical floor. I am suspecting that the patient's chest pain is possibly related to right pleural effusion. Will continue atenolol as well as Imdur. We will follow cardiac enzymes x3. Will get cardiologist involved. Will not give aspirin as well as heparin for now as we are planning thoracentesis on the right unless the patient's second troponin is more elevated than the first one.  3.  Large right pleural effusion, increasing over time. We will continue Lasix IV. Will get thoracocentesis in the morning. Has dyspnea, likely due to pleural effusion. We will follow with therapy.  4.  Renal insufficiency relatively stable. Admits to some urinary retention. We will get also postvoid bladder scan.  5.  Atrial fibrillation, rate controlled. We will continue atenolol. The patient is not on anticoagulation.  6.  Hypothyroidism. Will advance Synthroid.  7.  Elevated troponin. We will continue beta blockers as well as Imdur but no aspirin or heparin today unless his second troponin is more elevated.    8.  Elevated transaminases. CK level is normal and possibly congestive heart failure related.   TIME SPENT: 50 minutes on this patient.   ____________________________ Katharina Caper, MD rv:np D: 07/04/2013 21:29:40 ET T: 07/04/2013 22:50:40 ET JOB#: [id number]  cc: Katharina Caper, MD, <Dictator> Karie Schwalbe, MD  Mersades Barbaro MD ELECTRONICALLY SIGNED 07/31/2013 17:20

## 2014-12-28 IMAGING — CR DG CHEST 2V
1 series · 2 of 2 positions shown · non-contrast
Comparison: DG CHEST 2V dated 12/07/2012

CLINICAL DATA: Weakness, congestive heart failure

EXAM:
CHEST  2 VIEW

[Series 1: x chest ap · 0.14mm/px · 2 of 2 slices shown]
[im 1/2]
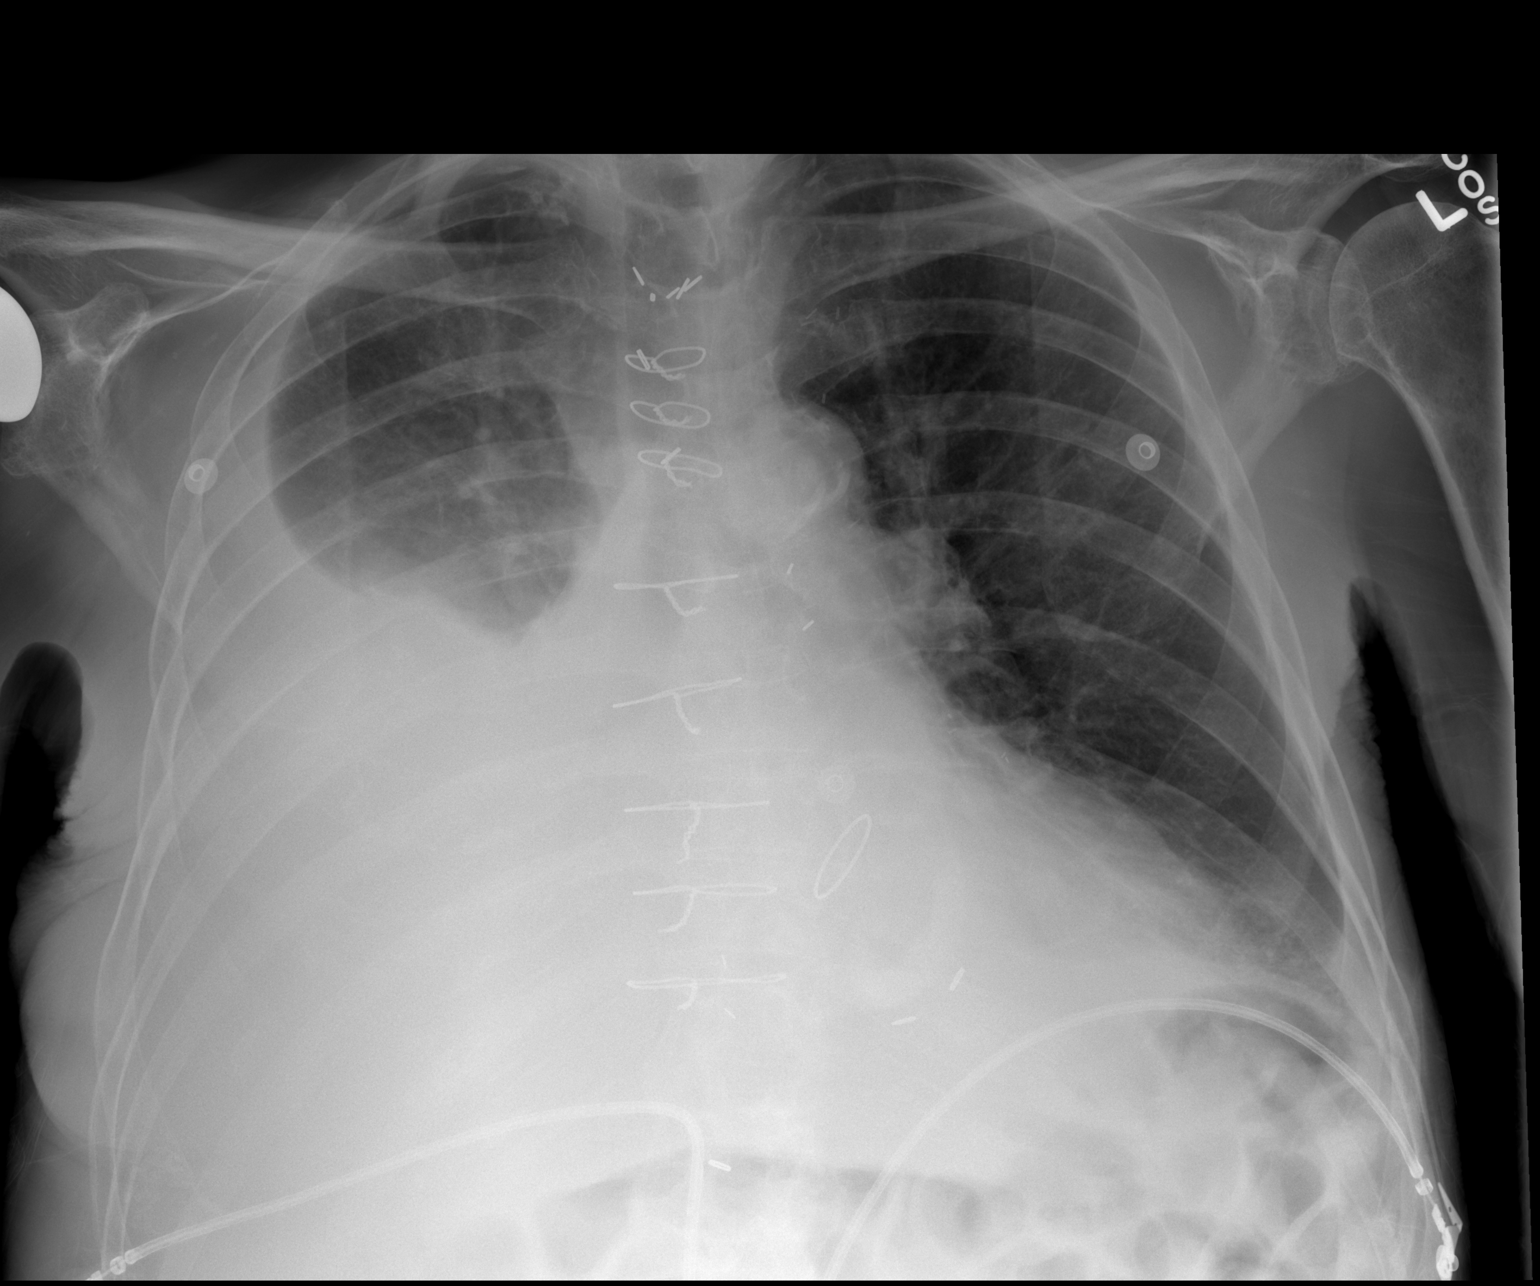
[im 2/2]
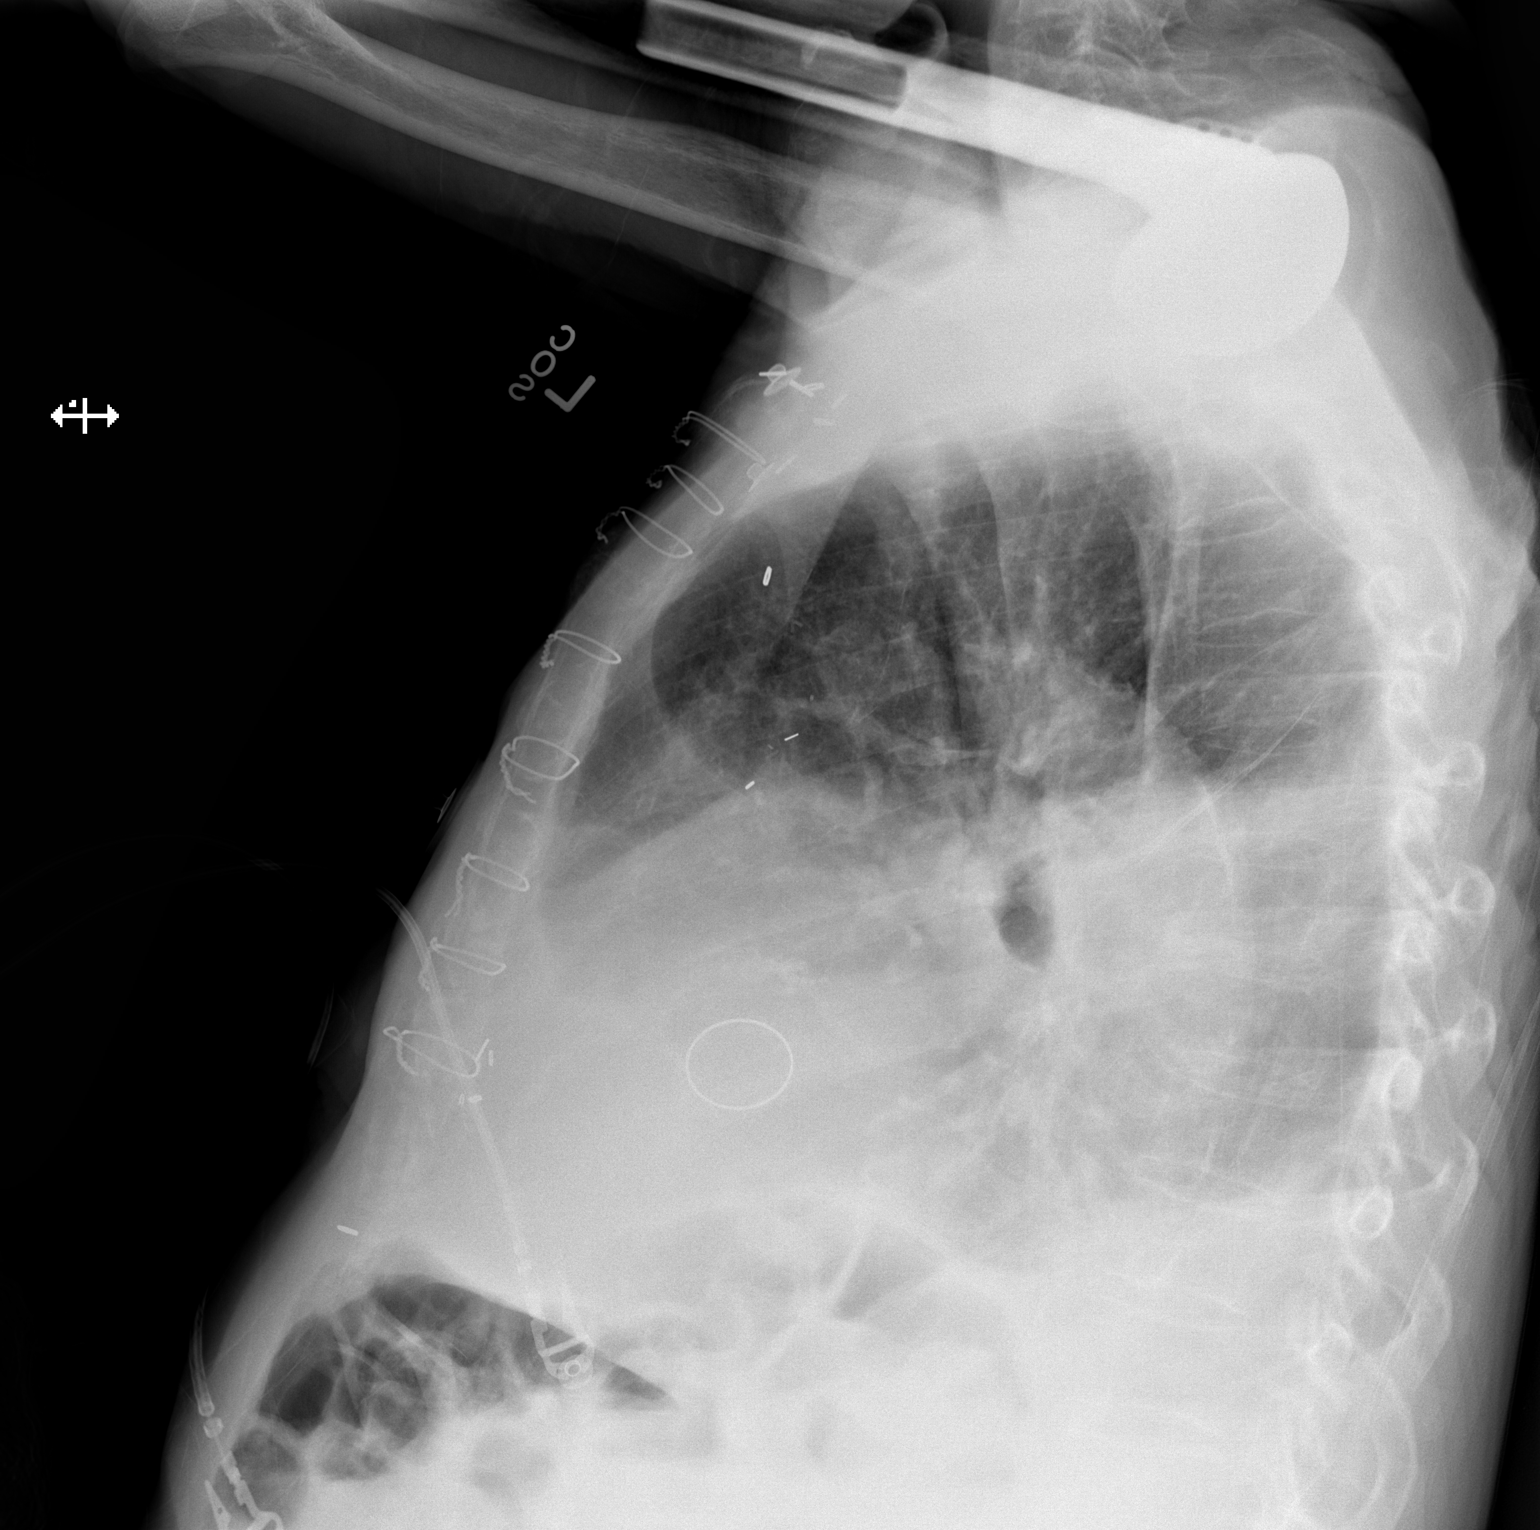

[2 of 2 positions shown; findings below may reference images not displayed]

FINDINGS: Sternotomy wires overlie stable enlarged cardiac silhouette. There
is a moderate to large right pleural effusion which is increased in
volume compared to prior. Small left effusion is noted. There are
valvular calcifications and a valve replacement. There is no
pulmonary edema in the left lung. Right upper lobe is clear. No
aggressive osseous lesion.
IMPRESSION: Cardiomegaly and increasing right pleural effusion which is large in
volume.

## 2014-12-29 IMAGING — CR DG CHEST POST BIOPSY - THORACENTESIS
1 series · 1 of 1 positions shown · non-contrast
Comparison: Chest x-ray of 04 July, 2013.

CLINICAL DATA: Post thoracentesis film.

EXAM:
CHEST XRAY 1 VIEW

[x chest ap]
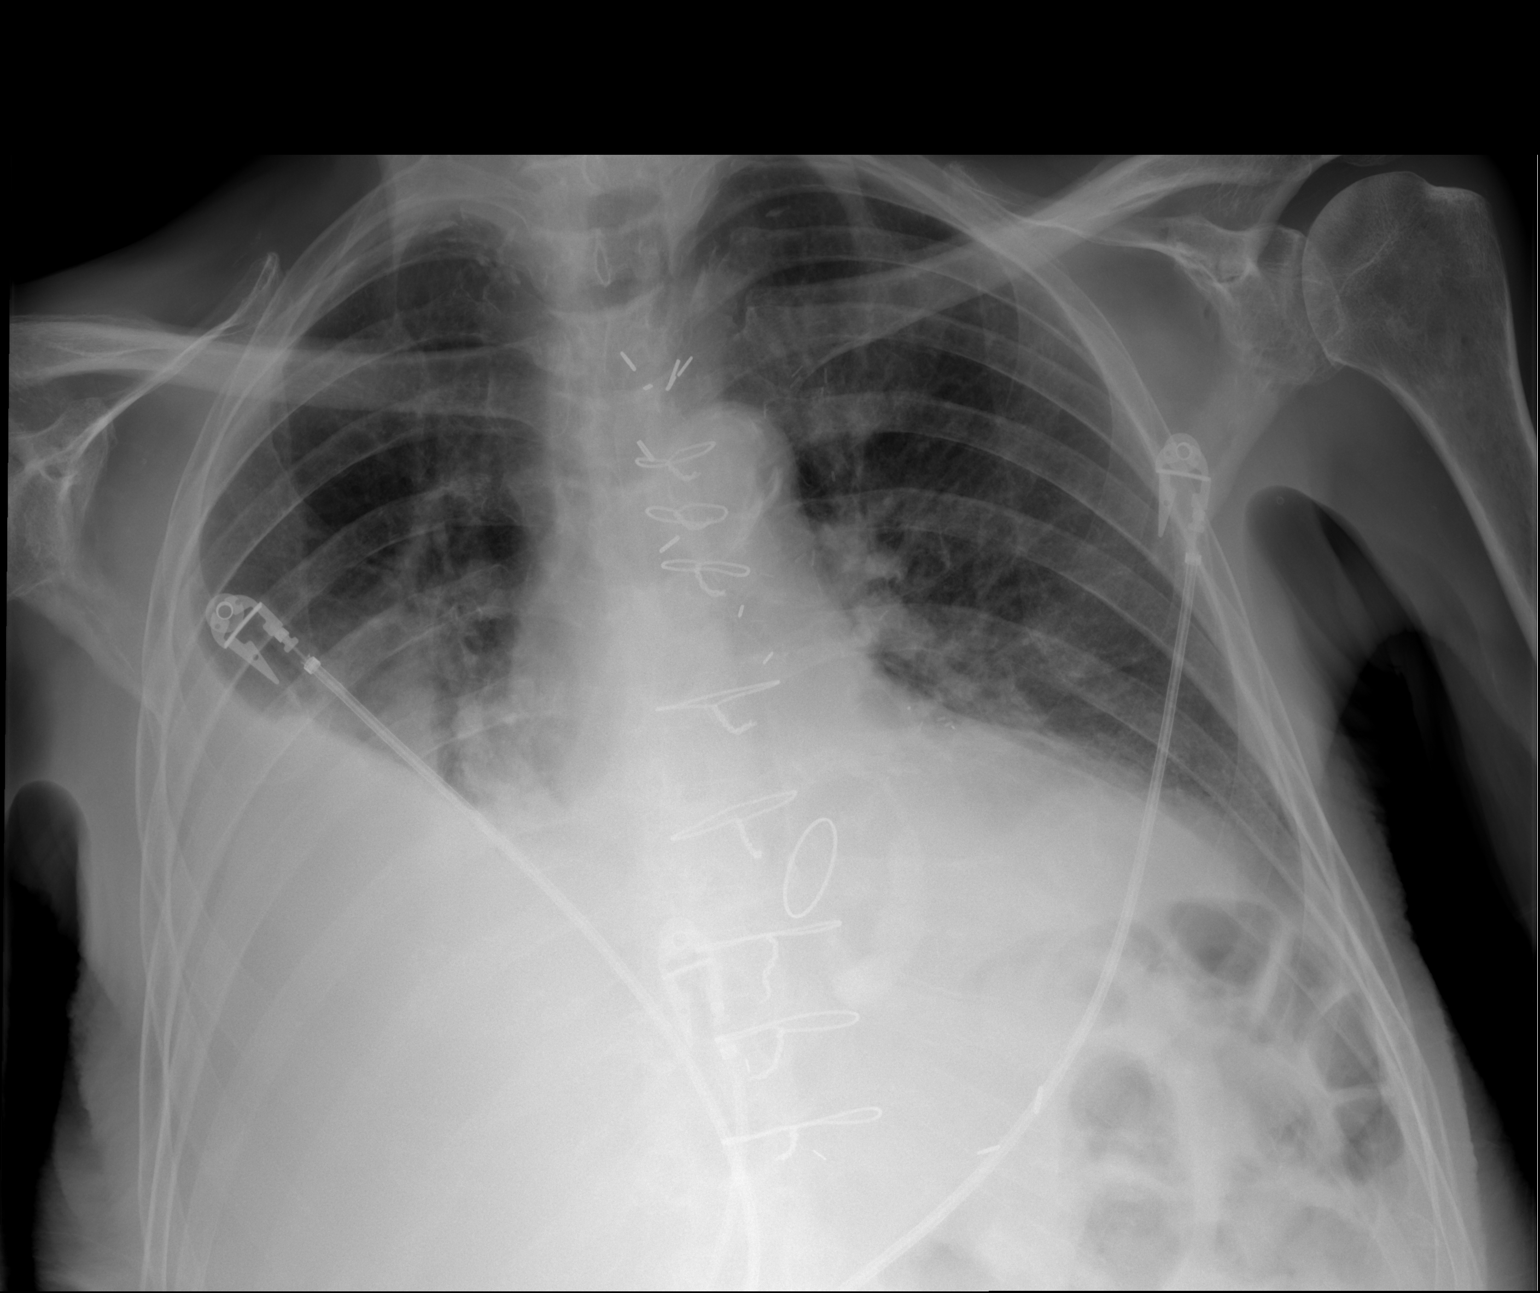

[1 of 1 positions shown; findings below may reference images not displayed]

FINDINGS: There remains a large right-sided pleural effusion but the volume
has decreased since thoracentesis. There is no postprocedure
pneumothorax area and the aeration of the right lung has improved.
The left lung is well-expanded. The left hemidiaphragm is partially
obscured and the retrocardiac region is dense.

The patient has undergone previous median sternotomy. The
cardiopericardial silhouette is enlarged. There is dense
calcification in the region of the mitral valvular annulus. A valve
ring is in place near the annulus which may reflect previous. Mitral
valve replacement. The pulmonary vascularity is somewhat indistinct.
IMPRESSION: 1. There is no evidence of a post procedure pneumothorax on the
right. The volume of the pleural fluid has decreased. There is
better aeration of the right lung.
2. Left lower lobe atelectasis may be present.
3. There is likely low-grade CHF with mild interstitial edema.

## 2014-12-29 IMAGING — US US GUIDE NEEDLE - US [PERSON_NAME]
1 series · 5 of 5 positions shown · non-contrast
Comparison: Chest radiograph 07/04/2013

CLINICAL DATA: Large right pleural effusion.

EXAM:
ULTRASOUND GUIDED RIGHT THORACENTESIS

[Series 1: us guide needle - us (person_name) · 0.24mm/px · 5 of 5 slices shown]
[im 1/5]
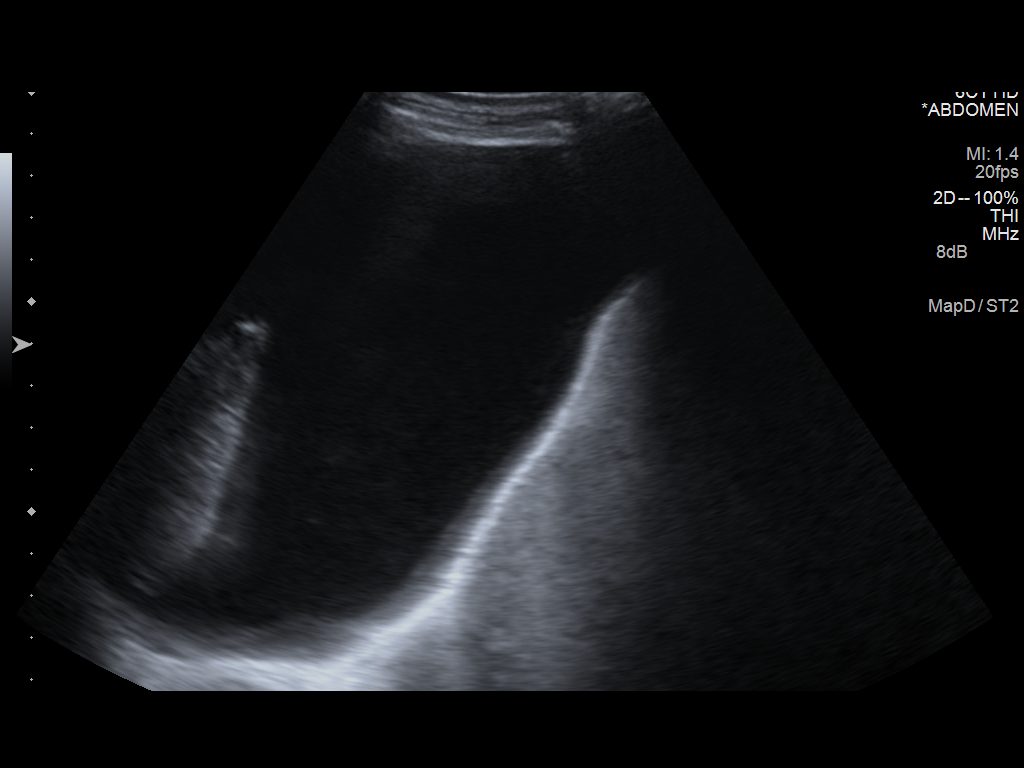
[im 2/5]
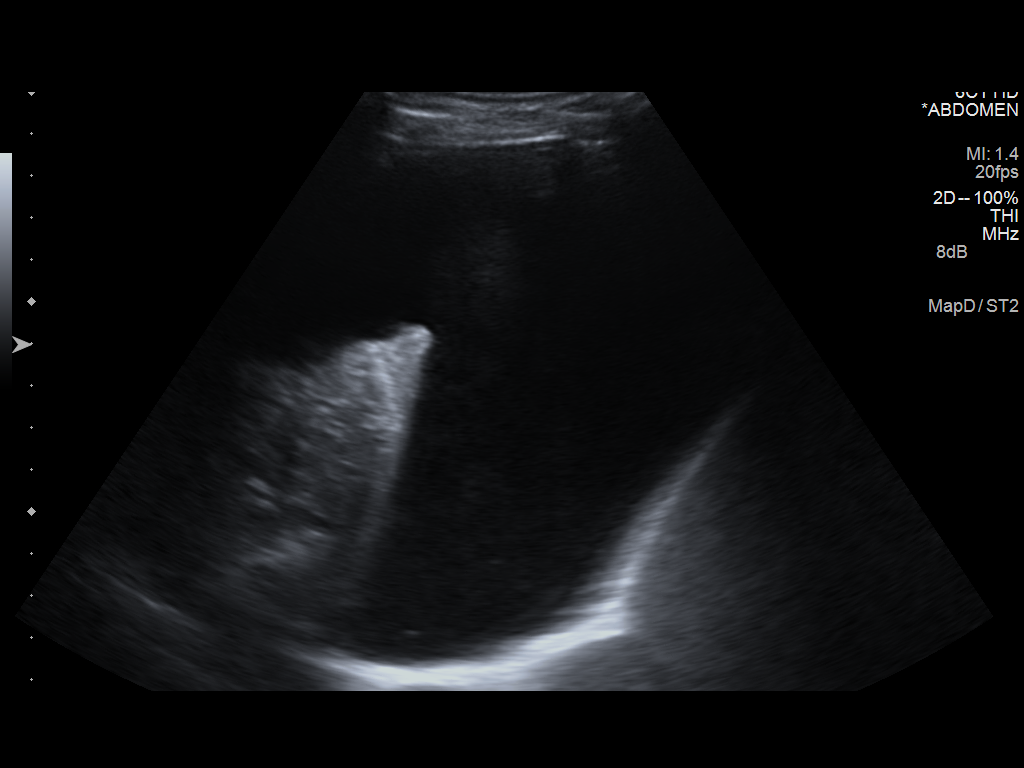
[im 3/5]
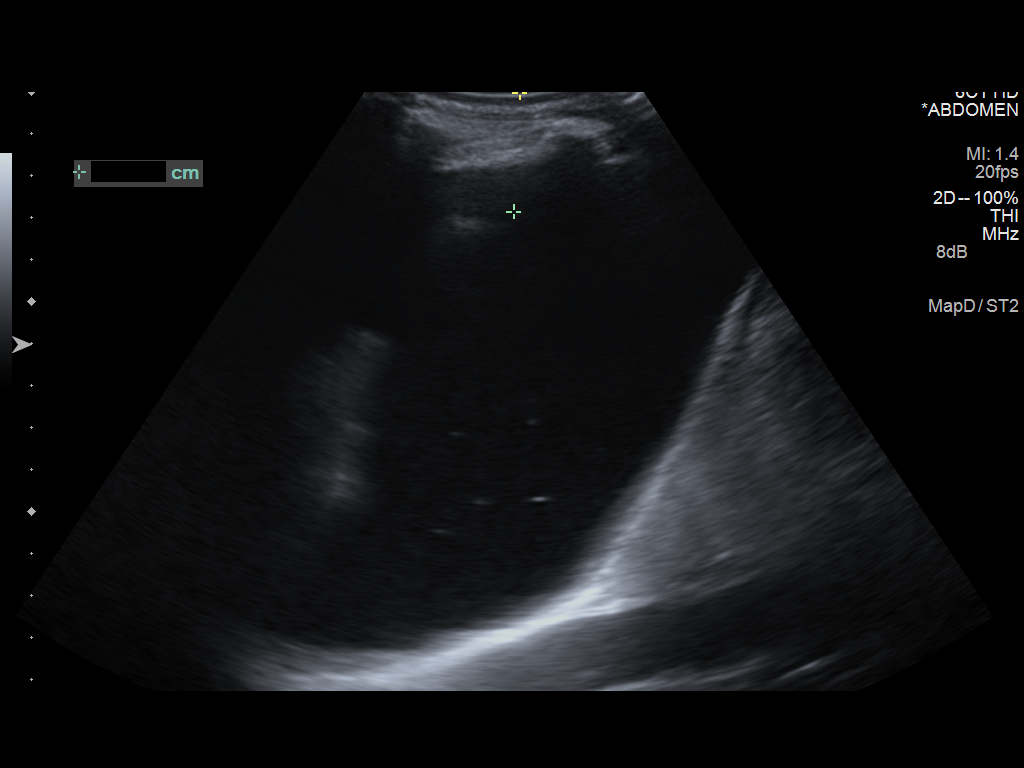
[im 4/5]
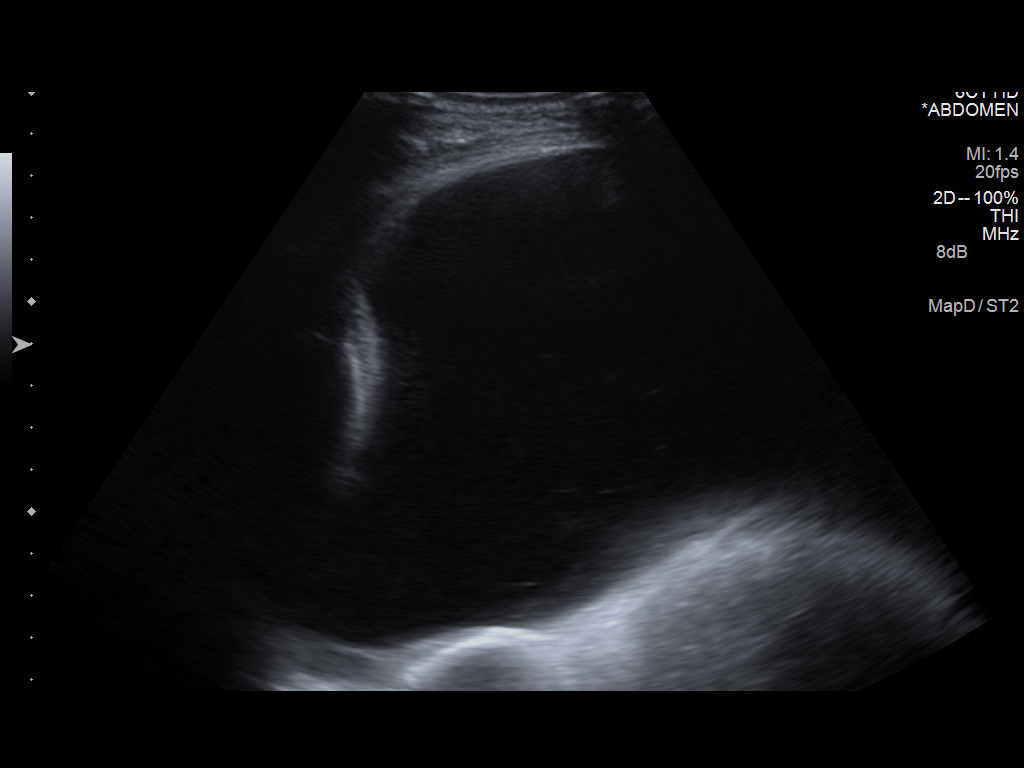
[im 5/5]
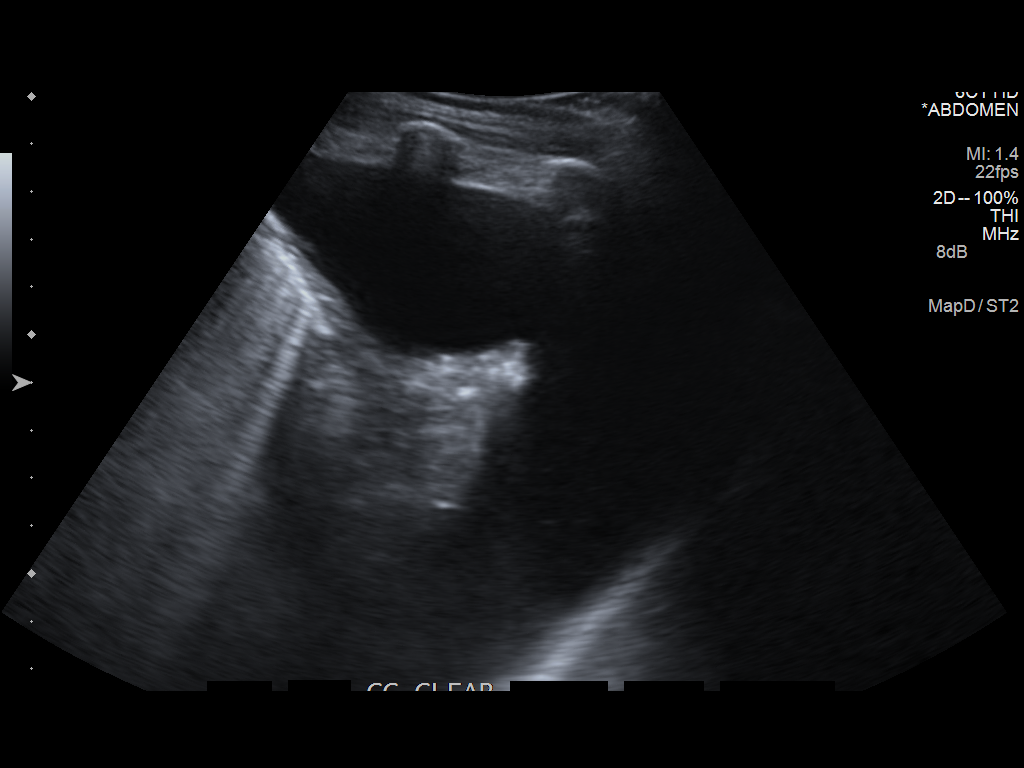

[5 of 5 positions shown; findings below may reference images not displayed]

PROCEDURE:
An ultrasound guided thoracentesis was thoroughly discussed with the
patient and questions answered. The benefits, risks, alternatives
and complications were also discussed. The patient understands and
wishes to proceed with the procedure. Written consent was obtained.

Ultrasound was performed to localize and mark an adequate pocket of
fluid in the right posterior chest. The area was then prepped and
draped in the normal sterile fashion. 1% Lidocaine was used for
local anesthesia. Under ultrasound guidance a Safe-T-Centesis
catheter was introduced. Thoracentesis was performed. The procedure
was stopped when the patient had persistent coughing. The catheter
was removed and a dressing applied.

Complications:  None.
FINDINGS: A total of approximately 1.3 L of clear yellow fluid was removed. A
fluid sample wassent for laboratory analysis.
IMPRESSION: Successful ultrasound guided right thoracentesis yielding 1.3 lead
of pleural fluid.
